# Patient Record
Sex: Female | Born: 1940
Health system: Southern US, Community
[De-identification: ages and names within clinical notes are randomized; demographics above are authoritative.]

## PROBLEM LIST (undated history)

## (undated) DIAGNOSIS — E119 Type 2 diabetes mellitus without complications: Secondary | ICD-10-CM

## (undated) DIAGNOSIS — M109 Gout, unspecified: Secondary | ICD-10-CM

## (undated) DIAGNOSIS — K219 Gastro-esophageal reflux disease without esophagitis: Secondary | ICD-10-CM

## (undated) DIAGNOSIS — F32A Depression, unspecified: Secondary | ICD-10-CM

## (undated) DIAGNOSIS — IMO0002 Reserved for concepts with insufficient information to code with codable children: Secondary | ICD-10-CM

## (undated) DIAGNOSIS — I10 Essential (primary) hypertension: Secondary | ICD-10-CM

## (undated) DIAGNOSIS — T7840XA Allergy, unspecified, initial encounter: Secondary | ICD-10-CM

## (undated) DIAGNOSIS — D649 Anemia, unspecified: Secondary | ICD-10-CM

## (undated) DIAGNOSIS — E669 Obesity, unspecified: Secondary | ICD-10-CM

## (undated) DIAGNOSIS — I639 Cerebral infarction, unspecified: Secondary | ICD-10-CM

## (undated) DIAGNOSIS — E785 Hyperlipidemia, unspecified: Secondary | ICD-10-CM

## (undated) DIAGNOSIS — I219 Acute myocardial infarction, unspecified: Secondary | ICD-10-CM

## (undated) HISTORY — DX: Type 2 diabetes mellitus without complications: E11.9

## (undated) HISTORY — DX: Gout, unspecified: M10.9

## (undated) HISTORY — DX: Cerebral infarction, unspecified: I63.9

## (undated) HISTORY — DX: Depression, unspecified: F32.A

## (undated) HISTORY — PX: TUBAL LIGATION: SHX77

## (undated) HISTORY — DX: Anemia, unspecified: D64.9

## (undated) HISTORY — DX: Hyperlipidemia, unspecified: E78.5

## (undated) HISTORY — DX: Allergy, unspecified, initial encounter: T78.40XA

## (undated) HISTORY — DX: Acute myocardial infarction, unspecified: I21.9

## (undated) HISTORY — PX: ABDOMINAL HYSTERECTOMY: SHX81

## (undated) HISTORY — DX: Gastro-esophageal reflux disease without esophagitis: K21.9

## (undated) HISTORY — DX: Obesity, unspecified: E66.9

## (undated) HISTORY — DX: Essential (primary) hypertension: I10

## (undated) HISTORY — DX: Reserved for concepts with insufficient information to code with codable children: IMO0002

---

## 2005-01-18 ENCOUNTER — Ambulatory Visit: Payer: Self-pay | Admitting: Family Medicine

## 2006-02-08 ENCOUNTER — Ambulatory Visit: Payer: Self-pay

## 2007-02-14 ENCOUNTER — Ambulatory Visit: Payer: Self-pay | Admitting: Family Medicine

## 2008-02-19 ENCOUNTER — Ambulatory Visit: Payer: Self-pay | Admitting: Family Medicine

## 2009-06-10 ENCOUNTER — Ambulatory Visit: Payer: Self-pay | Admitting: Family Medicine

## 2009-07-11 ENCOUNTER — Emergency Department: Payer: Self-pay | Admitting: Internal Medicine

## 2009-07-11 IMAGING — CR DG HAND COMPLETE 3+V*L*
1 series · 3 of 3 positions shown · non-contrast
Comparison: none

REASON FOR EXAM: pain
COMMENTS:

PROCEDURE:     DXR - DXR HAND LT COMPLETE  W/OBLIQUES  - [DATE]  [DATE]
RESULT:     Comparison:  None

[Series 1: view not recorded · 0.17mm/px · 3 of 3 slices shown]
[im 1/3]
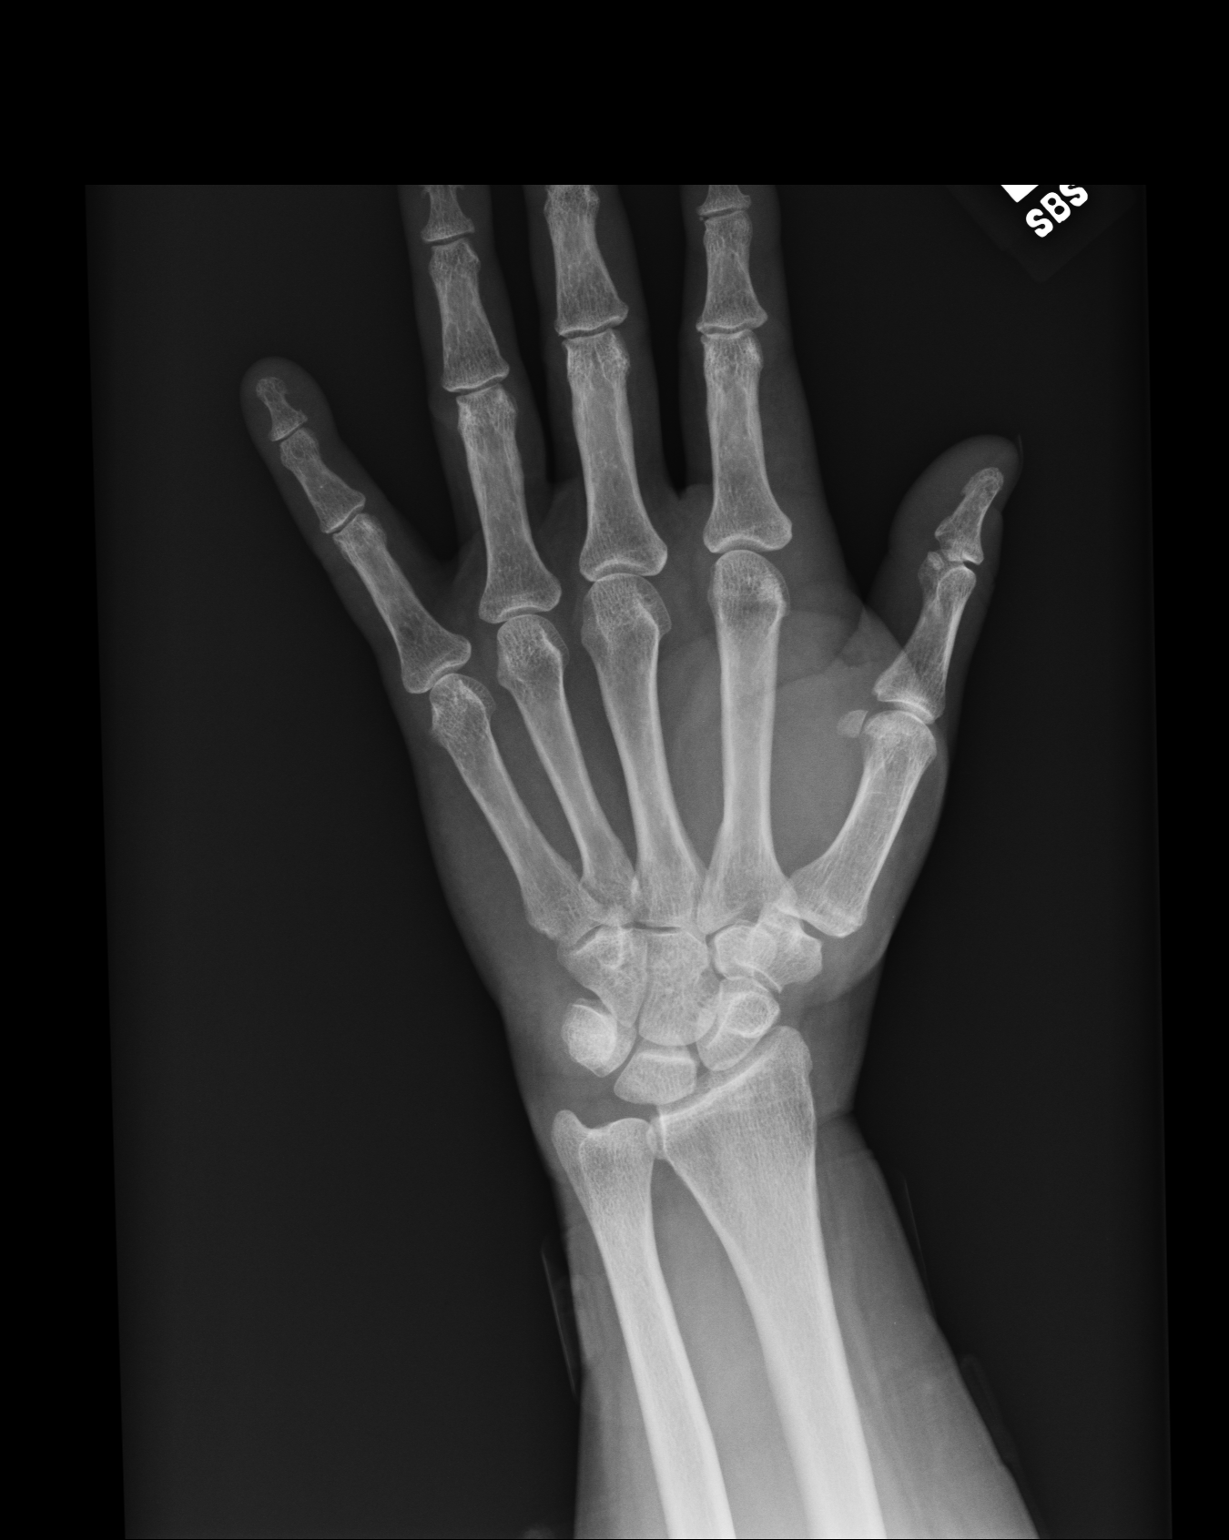
[im 2/3]
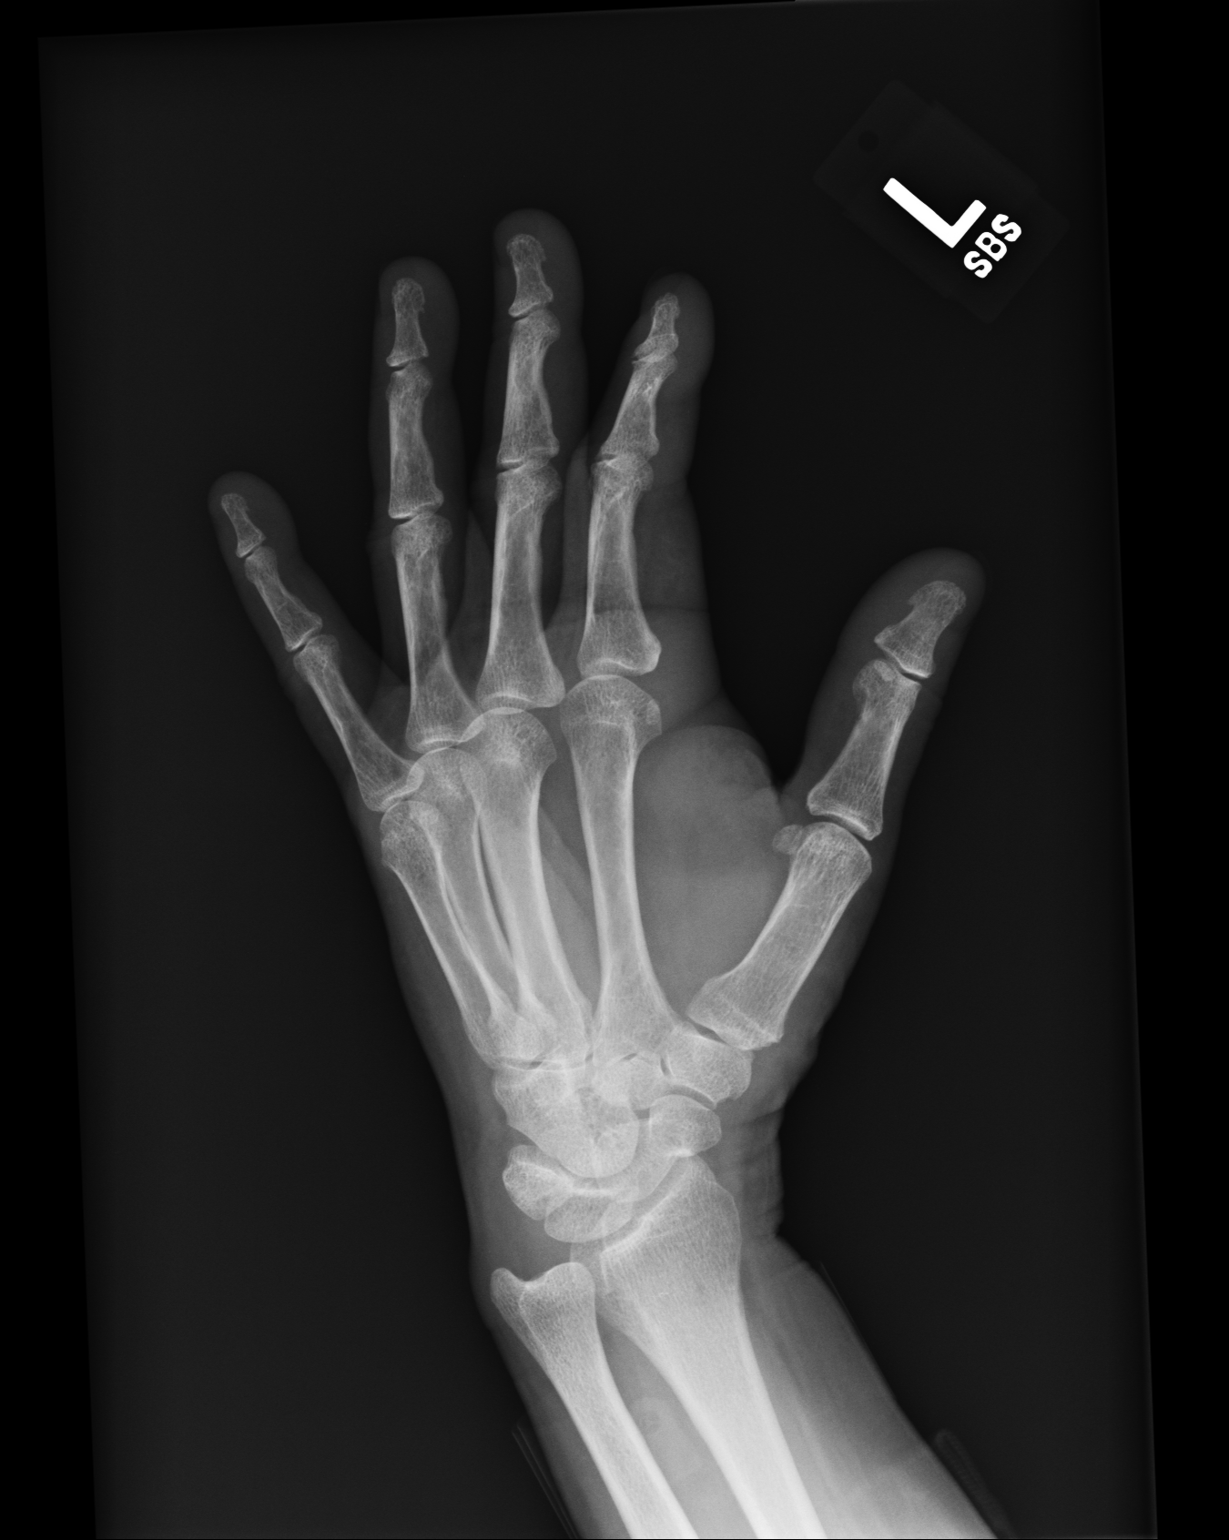
[im 3/3]
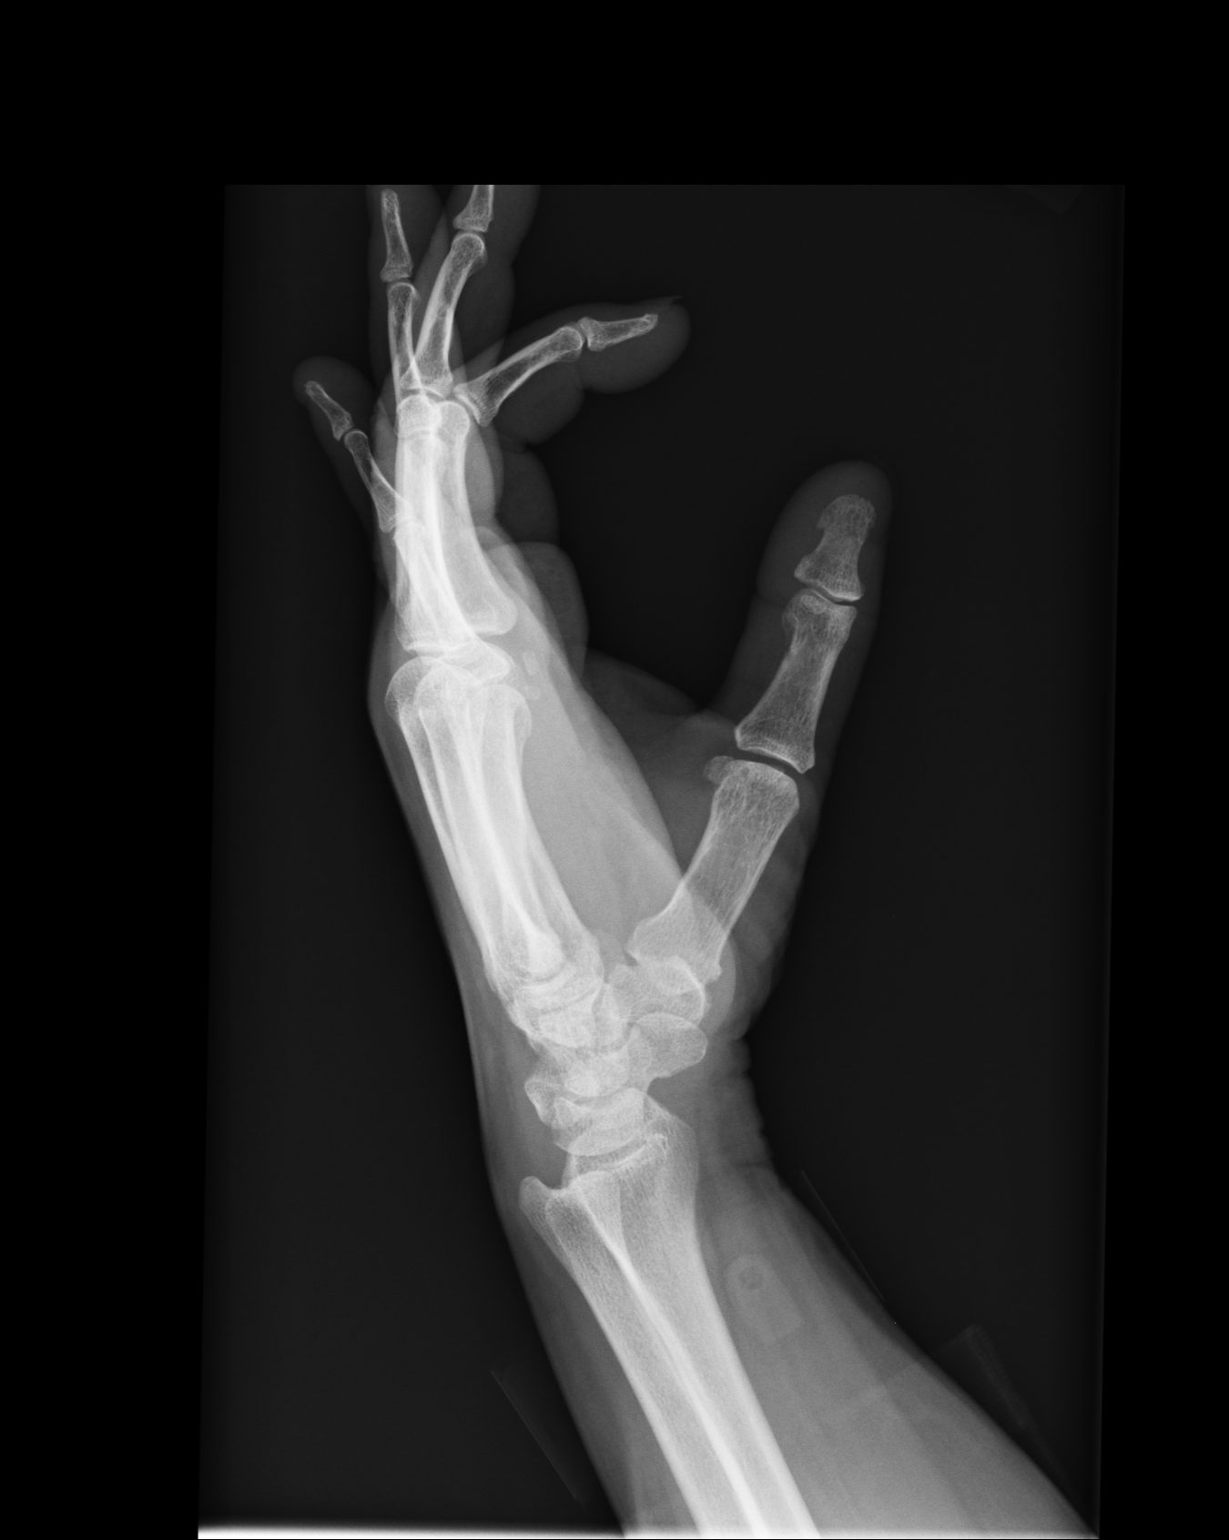

[3 of 3 positions shown; findings below may reference images not displayed]

FINDINGS: AP, oblique, and lateral views of the right hand demonstrates no fracture or
dislocation.

There is relative dorsal subluxation of the distal ulna relative 2-D radius
which may be projectional versus secondary to dorsal subluxation. Correlate
with physical exam.

There is normal bone mineralization. There are no erosive changes. The joint
spaces are maintained. There is no soft tissue swelling.
IMPRESSION: Please see above.

## 2010-11-03 ENCOUNTER — Ambulatory Visit: Payer: Self-pay | Admitting: Family Medicine

## 2011-12-14 ENCOUNTER — Ambulatory Visit: Payer: Self-pay | Admitting: Family Medicine

## 2012-08-15 IMAGING — CR DG ELBOW 2V*L*
1 series · 2 of 2 positions shown · non-contrast
Comparison: None.

CLINICAL DATA: Left elbow pain

EXAM:
LEFT ELBOW - 2 VIEW

[Series 1: ap · 0.17mm/px · 2 of 2 slices shown]
[im 1/2]
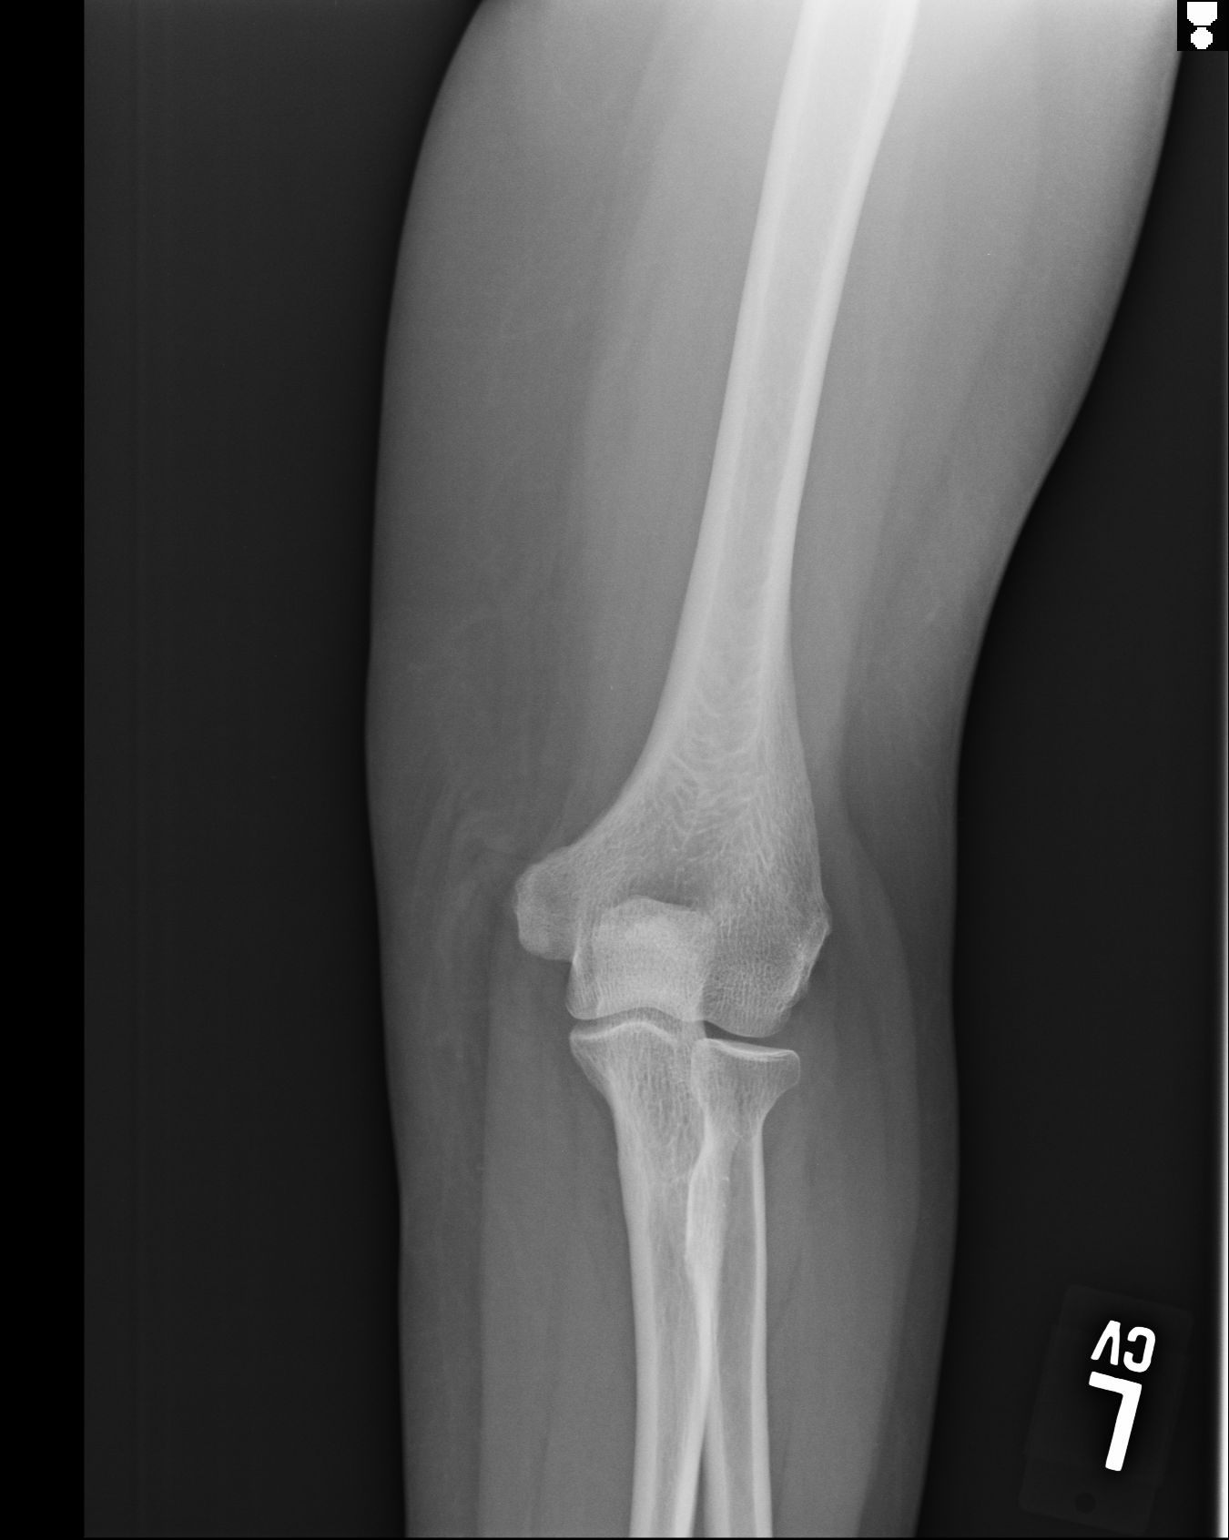
[im 2/2]
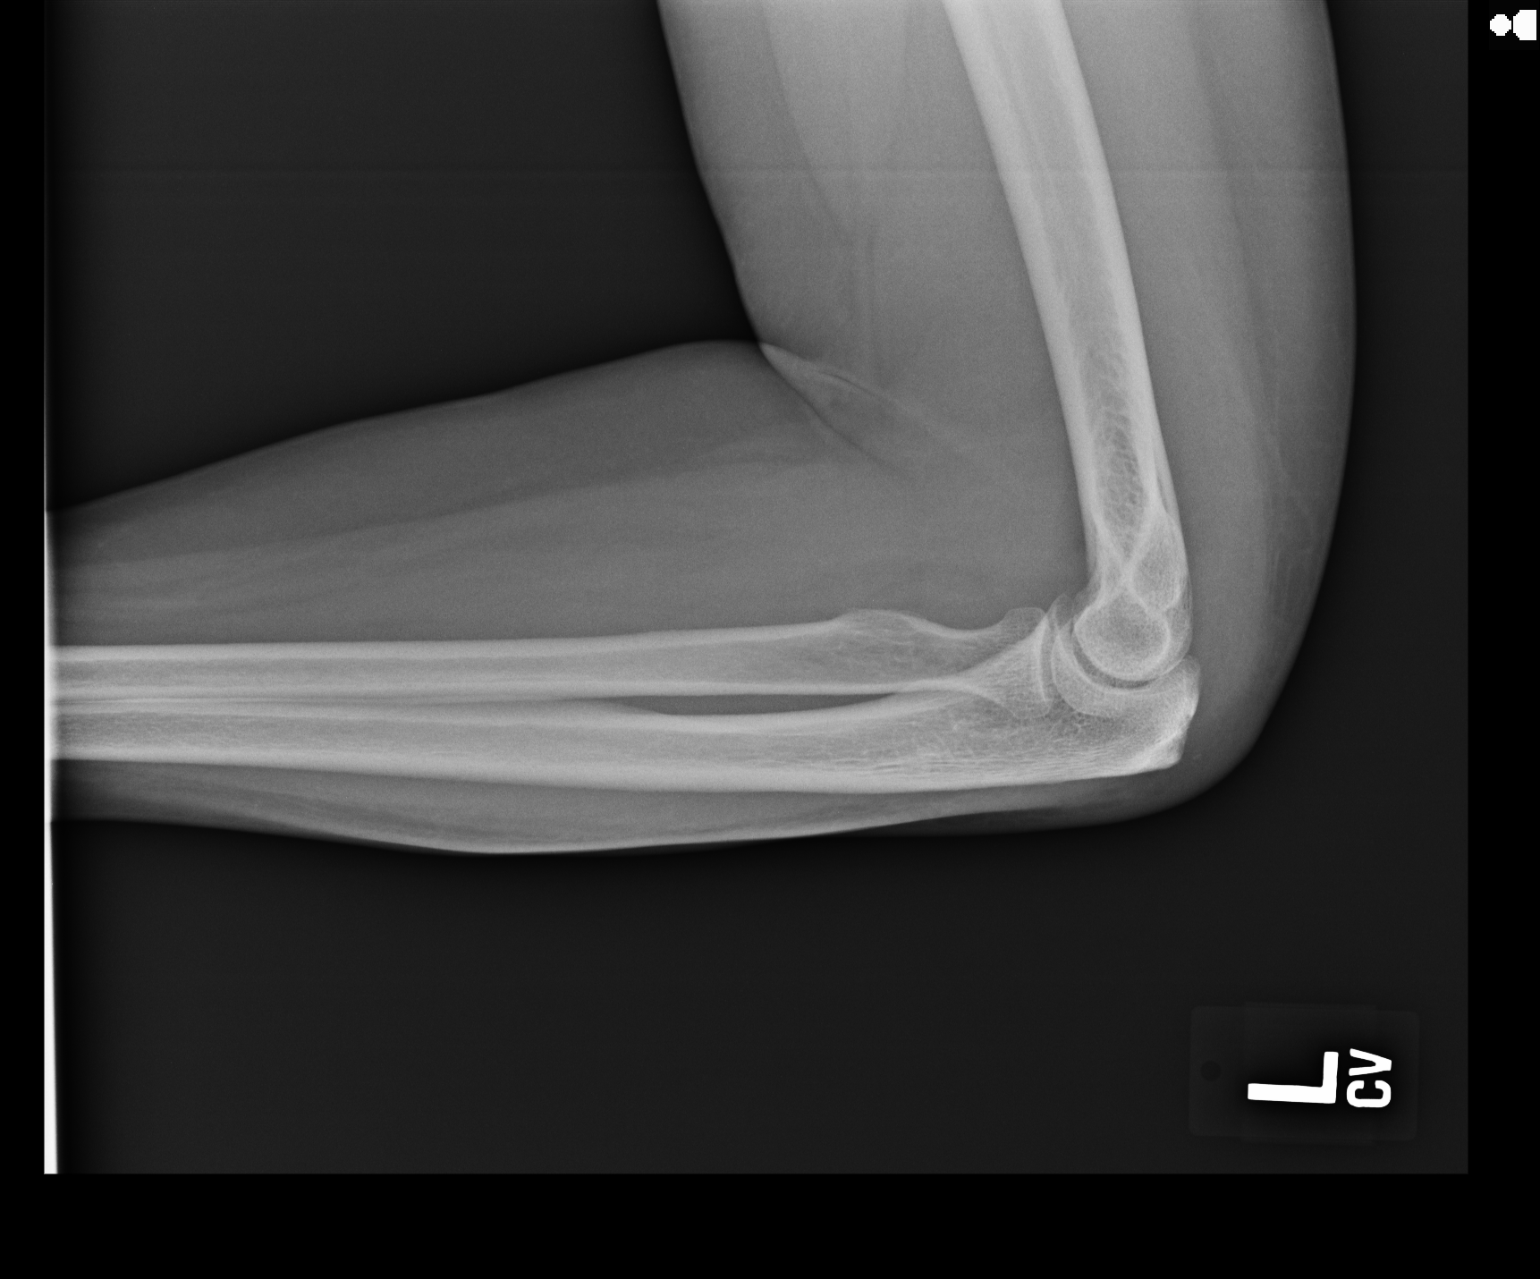

[2 of 2 positions shown; findings below may reference images not displayed]

FINDINGS: There is no evidence of fracture, dislocation, or joint effusion.
There is no evidence of arthropathy or other focal bone abnormality.
Soft tissues are unremarkable.
IMPRESSION: No acute abnormality noted.

## 2012-08-19 ENCOUNTER — Ambulatory Visit: Payer: Self-pay | Admitting: Family Medicine

## 2012-08-31 ENCOUNTER — Ambulatory Visit: Payer: Self-pay | Admitting: Family Medicine

## 2012-12-16 ENCOUNTER — Ambulatory Visit: Payer: Self-pay | Admitting: Family Medicine

## 2013-10-21 ENCOUNTER — Ambulatory Visit: Payer: Self-pay | Admitting: Family Medicine

## 2014-06-15 ENCOUNTER — Emergency Department: Payer: Self-pay | Admitting: Emergency Medicine

## 2014-06-15 LAB — CBC WITH DIFFERENTIAL/PLATELET
BASOS ABS: 0 10*3/uL (ref 0.0–0.1)
BASOS PCT: 0.7 %
Eosinophil #: 0 10*3/uL (ref 0.0–0.7)
Eosinophil %: 0.5 %
HCT: 32 % — AB (ref 35.0–47.0)
HGB: 10.3 g/dL — ABNORMAL LOW (ref 12.0–16.0)
Lymphocyte #: 1.3 10*3/uL (ref 1.0–3.6)
Lymphocyte %: 26.6 %
MCH: 28.3 pg (ref 26.0–34.0)
MCHC: 32.3 g/dL (ref 32.0–36.0)
MCV: 88 fL (ref 80–100)
MONO ABS: 0.5 x10 3/mm (ref 0.2–0.9)
Monocyte %: 10.4 %
NEUTROS ABS: 3.1 10*3/uL (ref 1.4–6.5)
Neutrophil %: 61.8 %
Platelet: 275 10*3/uL (ref 150–440)
RBC: 3.66 10*6/uL — ABNORMAL LOW (ref 3.80–5.20)
RDW: 14.7 % — ABNORMAL HIGH (ref 11.5–14.5)
WBC: 5 10*3/uL (ref 3.6–11.0)

## 2014-06-15 LAB — BASIC METABOLIC PANEL
ANION GAP: 7 (ref 7–16)
BUN: 11 mg/dL (ref 7–18)
CO2: 30 mmol/L (ref 21–32)
CREATININE: 1 mg/dL (ref 0.60–1.30)
Calcium, Total: 9.4 mg/dL (ref 8.5–10.1)
Chloride: 104 mmol/L (ref 98–107)
EGFR (African American): >60
EGFR (Non-African Amer.): 57 — ABNORMAL LOW
Glucose: 121 mg/dL — ABNORMAL HIGH (ref 65–99)
OSMOLALITY: 282 (ref 275–301)
Potassium: 3.2 mmol/L — ABNORMAL LOW (ref 3.5–5.1)
SODIUM: 141 mmol/L (ref 136–145)

## 2014-06-15 LAB — URIC ACID: Uric Acid: 4.3 mg/dL (ref 2.6–6.0)

## 2014-06-15 LAB — SEDIMENTATION RATE: ERYTHROCYTE SED RATE: 69 mm/h — AB (ref 0–30)

## 2014-06-15 IMAGING — CR RIGHT FOOT COMPLETE - 3+ VIEW
1 series · 3 of 3 positions shown · non-contrast
Comparison: None.

CLINICAL DATA: Intermittent right foot pain without known injury

EXAM:
RIGHT FOOT COMPLETE - 3+ VIEW

[Series 1: dxr foot rt complete w/obliques · 0.14mm/px · 3 of 3 slices shown]
[im 1/3]
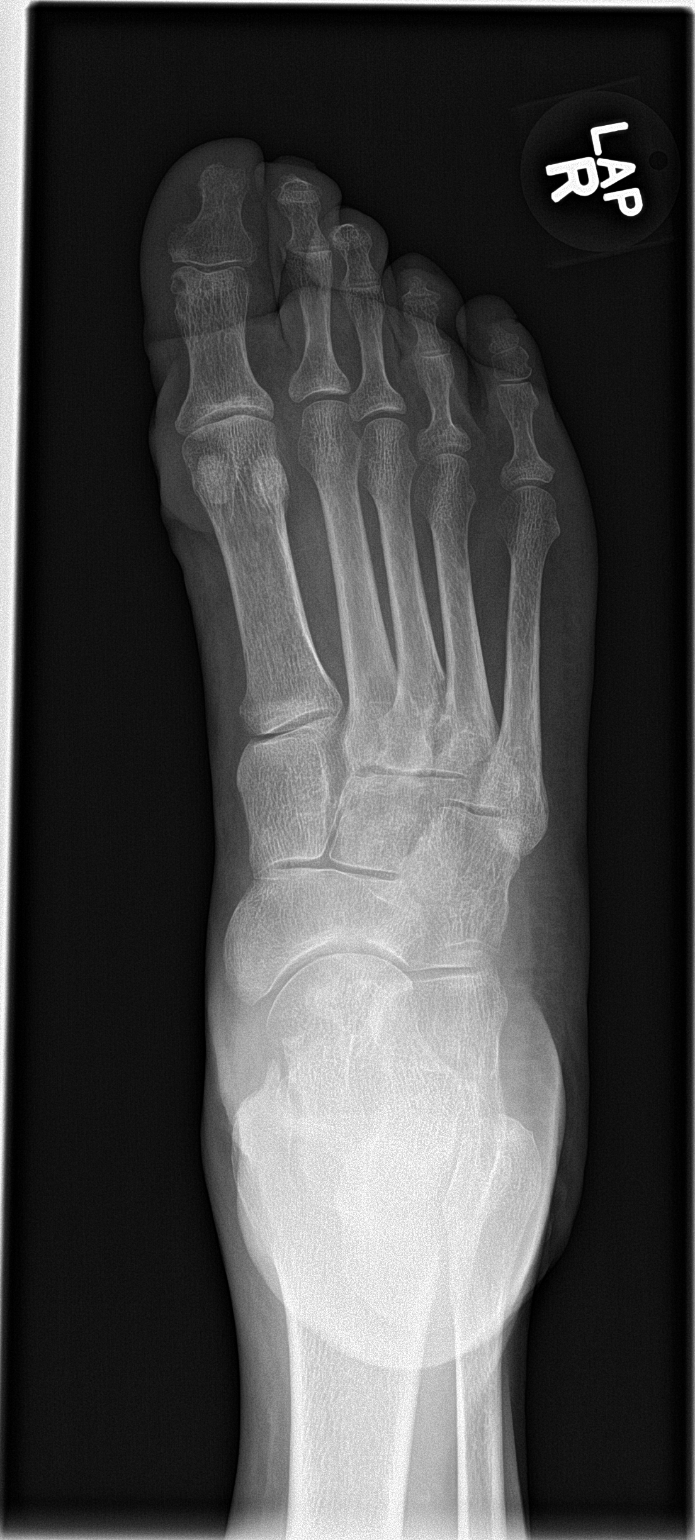
[im 2/3]
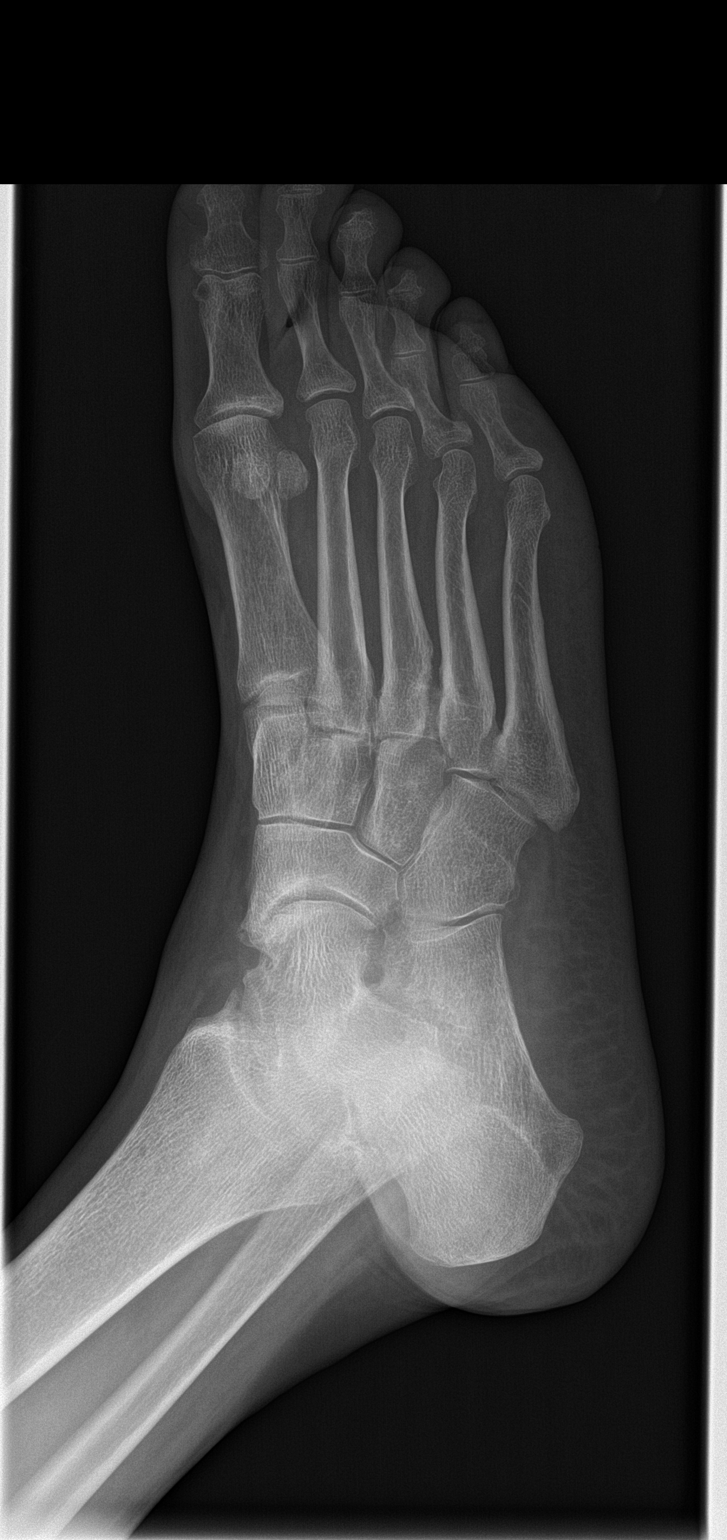
[im 3/3]
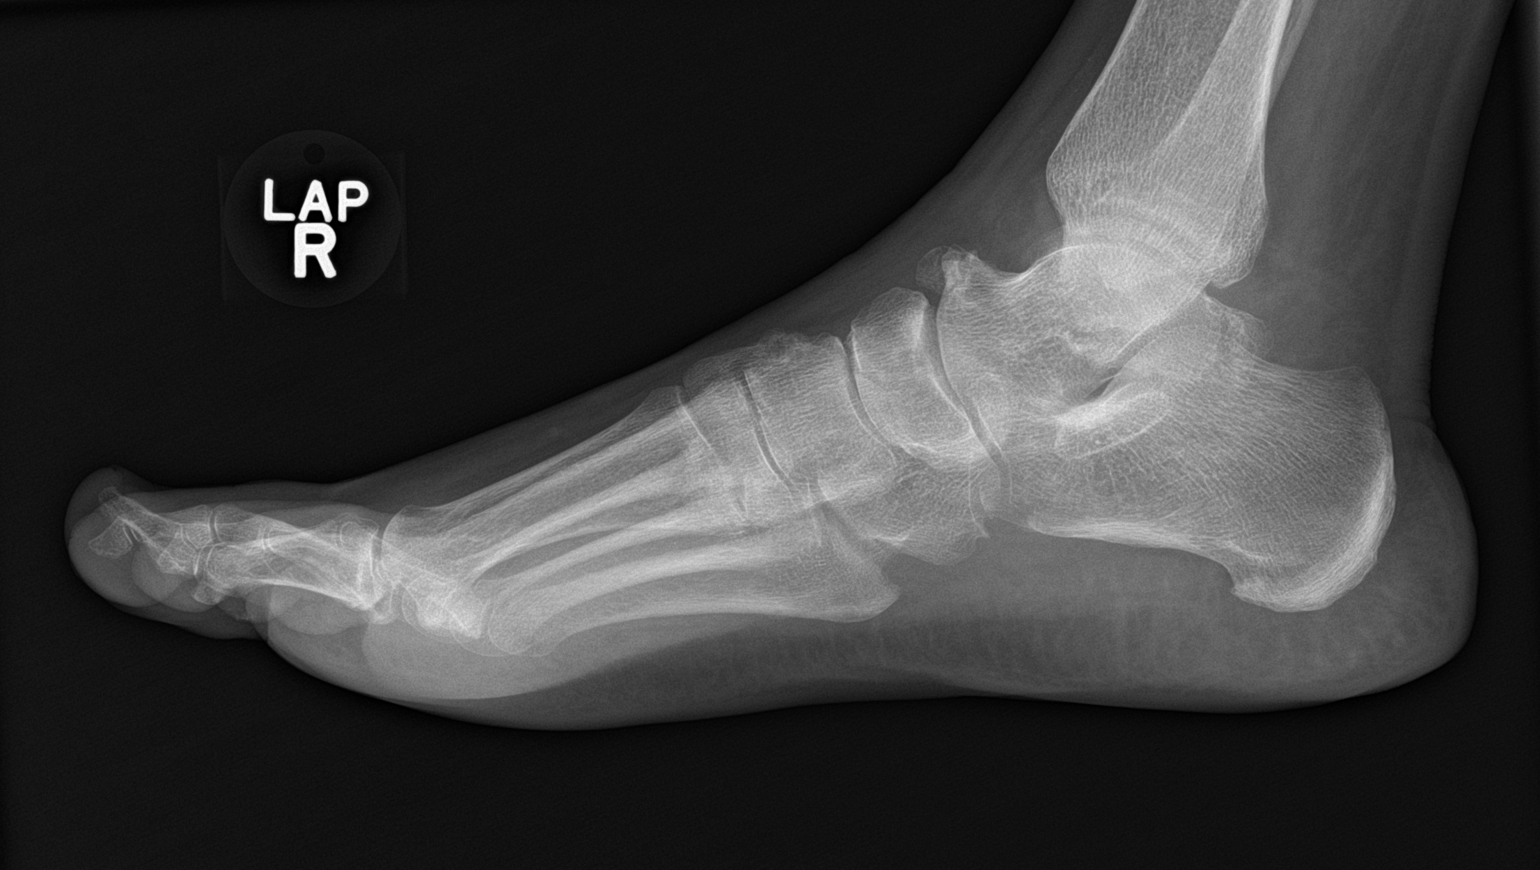

[3 of 3 positions shown; findings below may reference images not displayed]

FINDINGS: The bones of the right foot are adequately mineralized for age.
There is no lytic nor blastic bony lesion. There is no acute or
healing fracture. There are mild degenerative changes of the
interphalangeal joints and of the first metatarsophalangeal joint.
The tarsometatarsal joints are unremarkable. There are mild
degenerative changes of the intertarsal joints especially of the
talocalcaneal articulation. There is a tiny plantar calcaneal spur.
The soft tissues exhibit no gas collections or abnormal
calcifications.
IMPRESSION: There are mild degenerative changes of the right foot. No acute bony
abnormality is demonstrated.

## 2014-12-07 ENCOUNTER — Telehealth: Payer: Self-pay | Admitting: Family Medicine

## 2014-12-07 ENCOUNTER — Other Ambulatory Visit: Payer: Self-pay | Admitting: Family Medicine

## 2014-12-07 DIAGNOSIS — G8929 Other chronic pain: Secondary | ICD-10-CM

## 2014-12-07 MED ORDER — TRAMADOL HCL 50 MG PO TABS: 50.0000 mg | ORAL_TABLET | Freq: Three times a day (TID) | ORAL | Status: DC

## 2014-12-07 NOTE — Telephone Encounter (Signed)
Pt is requesting a refill on Tramadol HCL 50mg 

## 2014-12-28 ENCOUNTER — Other Ambulatory Visit: Payer: Self-pay | Admitting: Family Medicine

## 2015-01-07 ENCOUNTER — Encounter: Payer: Self-pay | Admitting: Family Medicine

## 2015-01-07 ENCOUNTER — Ambulatory Visit (INDEPENDENT_AMBULATORY_CARE_PROVIDER_SITE_OTHER): Payer: Commercial Managed Care - HMO | Admitting: Family Medicine

## 2015-01-07 VITALS — BP 138/78 | HR 80 | Temp 98.5°F | Resp 16 | Ht 68.0 in | Wt 168.1 lb

## 2015-01-07 DIAGNOSIS — I10 Essential (primary) hypertension: Secondary | ICD-10-CM | POA: Insufficient documentation

## 2015-01-07 DIAGNOSIS — E119 Type 2 diabetes mellitus without complications: Secondary | ICD-10-CM

## 2015-01-07 DIAGNOSIS — E1169 Type 2 diabetes mellitus with other specified complication: Secondary | ICD-10-CM | POA: Insufficient documentation

## 2015-01-07 DIAGNOSIS — E785 Hyperlipidemia, unspecified: Secondary | ICD-10-CM | POA: Diagnosis not present

## 2015-01-07 LAB — POCT GLYCOSYLATED HEMOGLOBIN (HGB A1C): Hemoglobin A1C: 6.5

## 2015-01-07 LAB — GLUCOSE, POCT (MANUAL RESULT ENTRY): POC Glucose: 96 mg/dl (ref 70–99)

## 2015-01-07 LAB — POCT UA - MICROALBUMIN: Microalbumin Ur, POC: 20 mg/L

## 2015-01-07 NOTE — Progress Notes (Signed)
Name: Angela Horton   MRN: [id number]    DOB: [birth year]   Date:01/07/2015       Progress Note  Subjective  Chief Complaint  Chief Complaint  Patient presents with  . Hypertension  . Diabetes  . Hyperlipidemia    Hypertension This is a chronic problem. The current episode started more than 1 year ago. The problem is unchanged. The problem is controlled. Pertinent negatives include no blurred vision, chest pain, headaches, neck pain, orthopnea, palpitations or shortness of breath. There are no associated agents to hypertension. Risk factors for coronary artery disease include diabetes mellitus, dyslipidemia, obesity, post-menopausal state and sedentary lifestyle. Past treatments include angiotensin blockers and diuretics. The current treatment provides moderate improvement. There are no compliance problems.   Diabetes She presents for her follow-up diabetic visit. She has type 2 diabetes mellitus. Her disease course has been stable. There are no hypoglycemic associated symptoms. Pertinent negatives for hypoglycemia include no dizziness, headaches, nervousness/anxiousness, seizures or tremors. Pertinent negatives for diabetes include no blurred vision, no chest pain, no weakness and no weight loss. There are no diabetic complications. Her weight is stable. She is following a diabetic diet. Her overall blood glucose range is 90-110 mg/dl.  Hyperlipidemia This is a chronic problem. The current episode started more than 1 year ago. The problem is controlled. Recent lipid tests were reviewed and are normal. Exacerbating diseases include diabetes and obesity. There are no known factors aggravating her hyperlipidemia. Pertinent negatives include no chest pain, focal weakness, myalgias or shortness of breath. Current antihyperlipidemic treatment includes statins. The current treatment provides moderate improvement of lipids. There are no compliance problems.       Past Medical History  Diagnosis Date   . Hypertension   . Hyperlipidemia   . Diabetes mellitus without complication   . Gout   . Obesity     History  Substance Use Topics  . Smoking status: Never Smoker   . Smokeless tobacco: Not on file  . Alcohol Use: No     Current outpatient prescriptions:  .  amLODipine (NORVASC) 10 MG tablet, Take 10 mg by mouth daily., Disp: , Rfl: 7 .  atorvastatin (LIPITOR) 40 MG tablet, Take 40 mg by mouth daily., Disp: , Rfl: 5 .  benazepril (LOTENSIN) 20 MG tablet, TAKE 1 TABLET BY MOUTH EVERY DAY, Disp: 30 tablet, Rfl: 7 .  brimonidine (ALPHAGAN) 0.2 % ophthalmic solution, Place 1 drop into both eyes 2 (two) times daily., Disp: , Rfl: 3 .  glimepiride (AMARYL) 2 MG tablet, Take 2 mg by mouth daily with breakfast., Disp: , Rfl:  .  hydrochlorothiazide (HYDRODIURIL) 25 MG tablet, TAKE 1 TABLET BY MOUTH DAILY FOR BP IN PLACE OF LOSARTAN, Disp: , Rfl: 2 .  traMADol (ULTRAM) 50 MG tablet, Take 1 tablet (50 mg total) by mouth 3 (three) times daily., Disp: 90 tablet, Rfl: 0  No Known Allergies  Review of Systems  Constitutional: Negative for fever, chills and weight loss.  HENT: Negative for congestion, hearing loss, sore throat and tinnitus.   Eyes: Negative for blurred vision, double vision and redness.  Respiratory: Negative for cough, hemoptysis and shortness of breath.   Cardiovascular: Negative for chest pain, palpitations, orthopnea, claudication and leg swelling.  Gastrointestinal: Negative for heartburn, nausea, vomiting, diarrhea, constipation and blood in stool.  Genitourinary: Negative for dysuria, urgency, frequency and hematuria.  Musculoskeletal: Negative for myalgias, back pain, joint pain, falls and neck pain.  Skin: Negative for itching.  Neurological: Negative for  dizziness, tingling, tremors, focal weakness, seizures, loss of consciousness, weakness and headaches.  Endo/Heme/Allergies: Does not bruise/bleed easily.  Psychiatric/Behavioral: Negative for depression and  substance abuse. The patient is not nervous/anxious and does not have insomnia.      Objective  Filed Vitals:   01/07/15 0944  BP: 138/78  Pulse: 80  Temp: 98.5 F (36.9 C)  Resp: 16  Height: 5\' 8"  (1.727 m)  Weight: 168 lb 1 oz (76.233 kg)  SpO2: 97%     Physical Exam  Constitutional: She is oriented to person, place, and time and well-developed, well-nourished, and in no distress.  Overweight  HENT:  Head: Normocephalic.  Eyes: EOM are normal. Pupils are equal, round, and reactive to light.  Neck: Normal range of motion. No thyromegaly present.  Cardiovascular: Normal rate, regular rhythm and normal heart sounds.   No murmur heard. Pulmonary/Chest: Effort normal and breath sounds normal.  Abdominal: Soft. Bowel sounds are normal.  Musculoskeletal: Normal range of motion. She exhibits no edema.  Neurological: She is alert and oriented to person, place, and time. No cranial nerve deficit. Gait normal.  Skin: Skin is warm and dry. No rash noted.  Psychiatric: Memory and affect normal.      Assessment & Plan  1. Type 2 diabetes mellitus without complication  - POCT HgB A1C - POCT Glucose (CBG) - POCT UA - Microalbumin  2. Essential hypertension Near goal - Comprehensive metabolic panel  3. Hyperlipidemia Well-controlled - Lipid panel - Comprehensive metabolic panel - TSH - Comprehensive metabolic panel - Lipid panel - TSH

## 2015-01-07 NOTE — Patient Instructions (Signed)
Recheck in 4 months

## 2015-01-08 LAB — COMPREHENSIVE METABOLIC PANEL
ALT: 10 IU/L (ref 0–32)
AST: 40 IU/L (ref 0–40)
Albumin/Globulin Ratio: 1.2 (ref 1.1–2.5)
Albumin: 4.3 g/dL (ref 3.5–4.8)
Alkaline Phosphatase: 94 IU/L (ref 39–117)
BILIRUBIN TOTAL: 0.4 mg/dL (ref 0.0–1.2)
BUN / CREAT RATIO: 9 — AB (ref 11–26)
BUN: 8 mg/dL (ref 8–27)
CHLORIDE: 99 mmol/L (ref 97–108)
CO2: 25 mmol/L (ref 18–29)
Calcium: 9.8 mg/dL (ref 8.7–10.3)
Creatinine, Ser: 0.86 mg/dL (ref 0.57–1.00)
GFR calc non Af Amer: 66 mL/min/{1.73_m2} (ref >59)
GFR, EST AFRICAN AMERICAN: 76 mL/min/{1.73_m2} (ref >59)
GLUCOSE: 105 mg/dL — AB (ref 65–99)
Globulin, Total: 3.6 g/dL (ref 1.5–4.5)
Potassium: 4.2 mmol/L (ref 3.5–5.2)
Sodium: 140 mmol/L (ref 134–144)
Total Protein: 7.9 g/dL (ref 6.0–8.5)

## 2015-01-08 LAB — LIPID PANEL
CHOLESTEROL TOTAL: 198 mg/dL (ref 100–199)
Chol/HDL Ratio: 5.1 ratio units — ABNORMAL HIGH (ref 0.0–4.4)
HDL: 39 mg/dL — AB (ref >39)
LDL Calculated: 139 mg/dL — ABNORMAL HIGH (ref 0–99)
Triglycerides: 102 mg/dL (ref 0–149)
VLDL CHOLESTEROL CAL: 20 mg/dL (ref 5–40)

## 2015-01-08 LAB — TSH: TSH: 0.994 u[IU]/mL (ref 0.450–4.500)

## 2015-01-13 ENCOUNTER — Other Ambulatory Visit: Payer: Self-pay | Admitting: Family Medicine

## 2015-02-15 ENCOUNTER — Other Ambulatory Visit: Payer: Self-pay | Admitting: Family Medicine

## 2015-03-22 ENCOUNTER — Other Ambulatory Visit: Payer: Self-pay | Admitting: Family Medicine

## 2015-04-24 ENCOUNTER — Other Ambulatory Visit: Payer: Self-pay | Admitting: Family Medicine

## 2015-05-11 ENCOUNTER — Ambulatory Visit: Payer: Commercial Managed Care - HMO | Admitting: Family Medicine

## 2015-05-20 ENCOUNTER — Ambulatory Visit (INDEPENDENT_AMBULATORY_CARE_PROVIDER_SITE_OTHER): Payer: Commercial Managed Care - HMO | Admitting: Family Medicine

## 2015-05-20 ENCOUNTER — Encounter: Payer: Self-pay | Admitting: Family Medicine

## 2015-05-20 VITALS — BP 136/80 | HR 80 | Temp 98.2°F | Resp 16 | Ht 68.0 in | Wt 170.3 lb

## 2015-05-20 DIAGNOSIS — E785 Hyperlipidemia, unspecified: Secondary | ICD-10-CM

## 2015-05-20 DIAGNOSIS — M1000 Idiopathic gout, unspecified site: Secondary | ICD-10-CM | POA: Diagnosis not present

## 2015-05-20 DIAGNOSIS — I10 Essential (primary) hypertension: Secondary | ICD-10-CM

## 2015-05-20 DIAGNOSIS — R002 Palpitations: Secondary | ICD-10-CM | POA: Diagnosis not present

## 2015-05-20 DIAGNOSIS — E1169 Type 2 diabetes mellitus with other specified complication: Secondary | ICD-10-CM

## 2015-05-20 LAB — POCT GLYCOSYLATED HEMOGLOBIN (HGB A1C): HEMOGLOBIN A1C: 6.4

## 2015-05-20 LAB — GLUCOSE, POCT (MANUAL RESULT ENTRY): POC Glucose: 111 mg/dl — AB (ref 70–99)

## 2015-05-20 NOTE — Progress Notes (Signed)
Name: Angela Horton   MRN: [id number]    DOB: Dec 03, 1940   Date:05/20/2015       Progress Note  Subjective  Chief Complaint  Chief Complaint  Patient presents with  . Hypertension    4 month follow up  . Diabetes  . Hyperlipidemia    HPI  Diabetes  Patient presents for follow-up of diabetes which is present for  years. Is currently on a regimen of  . Patient states with their diet and exercise. There's been no hypoglycemic episodes and there  polyuria polydipsia polyphagia. His average fasting glucoses been in the low around - with a high around - . There is no end organ disease.  Last diabetic eye exam was .   Last visit with dietitian was . Last microalbumin was obtained in July of this year and was normal.   Hypertension   Patient presents for follow-up of hypertension. It has been present for over over 5 years.  Patient states that there is compliance with medical regimen which consists of amlodipine 10 mg daily benazepril 20 mg daily and 25 mg hydrochlorothiazide. There is no end organ disease. Cardiac risk factors include hypertension hyperlipidemia and diabetes.  Exercise regimen consist of mental .  Diet consist of sodium restriction .  Hyperlipidemia  Patient has a history of hyperlipidemia for over 5 years.  Current medical regimen consist of atorvastatin 40 mg daily at bedtime .  Compliance is good .  Diet and exercise are currently followed fairly well .  Risk factors for cardiovascular disease include hyperlipidemia diabetes hypertension .   There have been no side effects from the medication.    Gout history of present illness  Patient remains on when necessary dosing regimen of indomethacin for flares of gout. Last episode was  Past Medical History  Diagnosis Date  . Hypertension   . Hyperlipidemia   . Diabetes mellitus without complication ([place])   . Gout   . Obesity     Social History  Substance Use Topics  . Smoking status: Never Smoker   . Smokeless  tobacco: Not on file  . Alcohol Use: No     Current outpatient prescriptions:  .  amLODipine (NORVASC) 10 MG tablet, TAKE 1 TABLET EVERY DAY, Disp: 30 tablet, Rfl: 7 .  atorvastatin (LIPITOR) 40 MG tablet, Take 40 mg by mouth daily., Disp: , Rfl: 5 .  benazepril (LOTENSIN) 20 MG tablet, TAKE 1 TABLET BY MOUTH EVERY DAY, Disp: 30 tablet, Rfl: 7 .  brimonidine (ALPHAGAN) 0.2 % ophthalmic solution, Place 1 drop into both eyes 2 (two) times daily., Disp: , Rfl: 3 .  glimepiride (AMARYL) 2 MG tablet, TAKE 1/2 TABLET DAILY, Disp: 30 tablet, Rfl: 4 .  hydrochlorothiazide (HYDRODIURIL) 25 MG tablet, TAKE 1 TABLET BY MOUTH DAILY FOR BP IN PLACE OF LOSARTAN, Disp: 30 tablet, Rfl: 2 .  traMADol (ULTRAM) 50 MG tablet, Take 1 tablet (50 mg total) by mouth 3 (three) times daily., Disp: 90 tablet, Rfl: 0  No Known Allergies  Review of Systems  Constitutional: Negative for fever, chills and weight loss.  HENT: Negative for congestion, hearing loss, sore throat and tinnitus.   Eyes: Negative for blurred vision, double vision and redness.  Respiratory: Negative for cough, hemoptysis and shortness of breath.   Cardiovascular: Negative for chest pain, palpitations, orthopnea, claudication and leg swelling.  Gastrointestinal: Negative for heartburn, nausea, vomiting, diarrhea, constipation and blood in stool.  Genitourinary: Negative for dysuria, urgency, frequency and hematuria.  Musculoskeletal:  Negative for myalgias, back pain, joint pain, falls and neck pain.  Skin: Negative for itching.  Neurological: Negative for dizziness, tingling, tremors, focal weakness, seizures, loss of consciousness, weakness and headaches.  Endo/Heme/Allergies: Does not bruise/bleed easily.  Psychiatric/Behavioral: Negative for depression and substance abuse. The patient is not nervous/anxious and does not have insomnia.      Objective  Filed Vitals:   05/20/15 1102  BP: 136/80  Pulse: 80  Temp: 98.2 F (36.8 C)   TempSrc: Oral  Resp: 16  Height: 5\' 8"  (1.727 m)  Weight: 170 lb 4.8 oz (77.248 kg)  SpO2: 97%     Physical Exam  Constitutional: She is oriented to person, place, and time and well-developed, well-nourished, and in no distress.  HENT:  Head: Normocephalic.  Eyes: EOM are normal. Pupils are equal, round, and reactive to light.  Neck: Normal range of motion. No thyromegaly present.  Cardiovascular: Normal rate, normal heart sounds and intact distal pulses.   No murmur heard. Occasional ectopic beat noted on auscultation  Pulmonary/Chest: Effort normal and breath sounds normal.  Abdominal: Soft. Bowel sounds are normal.  Musculoskeletal: Normal range of motion. She exhibits no edema.  Neurological: She is alert and oriented to person, place, and time. No cranial nerve deficit. Gait normal.  Skin: Skin is warm and dry. No rash noted.  Psychiatric: Memory and affect normal.      Assessment & Plan  1. Type 2 diabetes mellitus with other specified complication ([place]) Well-controlled. We'll discontinue glimepiride - POCT HgB A1C - POCT Glucose (CBG)  2. Essential hypertension Well-controlled  3. Hyperlipidemia Well-controlled  4. Idiopathic gout, unspecified chronicity, unspecified site Well-controlled  5. Palpitations Evaluated with EKG - EKG 12-Lead

## 2015-06-04 ENCOUNTER — Other Ambulatory Visit: Payer: Self-pay | Admitting: Family Medicine

## 2015-08-02 DIAGNOSIS — R69 Illness, unspecified: Secondary | ICD-10-CM | POA: Diagnosis not present

## 2015-08-12 ENCOUNTER — Other Ambulatory Visit: Payer: Self-pay | Admitting: Family Medicine

## 2015-09-01 DIAGNOSIS — H40003 Preglaucoma, unspecified, bilateral: Secondary | ICD-10-CM | POA: Diagnosis not present

## 2015-09-20 ENCOUNTER — Ambulatory Visit: Payer: Commercial Managed Care - HMO | Admitting: Family Medicine

## 2015-10-12 ENCOUNTER — Ambulatory Visit (INDEPENDENT_AMBULATORY_CARE_PROVIDER_SITE_OTHER): Payer: Medicare HMO | Admitting: Family Medicine

## 2015-10-12 ENCOUNTER — Encounter: Payer: Self-pay | Admitting: Family Medicine

## 2015-10-12 VITALS — BP 126/78 | HR 81 | Temp 98.3°F | Resp 16 | Wt 172.7 lb

## 2015-10-12 DIAGNOSIS — I491 Atrial premature depolarization: Secondary | ICD-10-CM | POA: Diagnosis not present

## 2015-10-12 DIAGNOSIS — R079 Chest pain, unspecified: Secondary | ICD-10-CM | POA: Diagnosis not present

## 2015-10-12 DIAGNOSIS — R141 Gas pain: Secondary | ICD-10-CM | POA: Diagnosis not present

## 2015-10-12 NOTE — Progress Notes (Signed)
Name: Angela Horton   MRN: [id number]    DOB: Jan 02, 1941   Date:10/12/2015       Progress Note  Subjective  Chief Complaint  Chief Complaint  Patient presents with  . Leg Pain    patient stated that she has had bilateral leg pain (almost like cramps) for 3 days  . Arm Pain    patient also stated that she has had bilateral arm pain for the past 3 days  . Gas    she stated she had gas pains in her chest for the past 3-days. when she takes gas-x or drinks a pepsi cola she belches a lot and feels a little better.    HPI  Chest Pain: Onset 3 days ago, came on slowly, feels like gas pain (taking Gas-X and drinking Pepsi helped her burp that gas out and provided relief). Denies any heartburn, bloating, palpitations or chest tightness.   Past Medical History  Diagnosis Date  . Hypertension   . Hyperlipidemia   . Diabetes mellitus without complication ([place])   . Gout   . Obesity     Past Surgical History  Procedure Laterality Date  . Abdominal hysterectomy      Family History  Problem Relation Age of Onset  . Kidney disease Mother   . Kidney disease Sister   . Stroke Brother   . Heart disease Sister     Social History   Social History  . Marital Status: Married    Spouse Name: N/A  . Number of Children: N/A  . Years of Education: N/A   Occupational History  . Not on file.   Social History Main Topics  . Smoking status: Never Smoker   . Smokeless tobacco: Not on file  . Alcohol Use: No  . Drug Use: No  . Sexual Activity: Not on file   Other Topics Concern  . Not on file   Social History Narrative     Current outpatient prescriptions:  .  amLODipine (NORVASC) 10 MG tablet, TAKE 1 TABLET EVERY DAY, Disp: 30 tablet, Rfl: 7 .  atorvastatin (LIPITOR) 40 MG tablet, TAKE 1 TABLET BY MOUTH ONCE DAILY, Disp: 30 tablet, Rfl: 5 .  benazepril (LOTENSIN) 20 MG tablet, TAKE 1 TABLET BY MOUTH EVERY DAY, Disp: 30 tablet, Rfl: 7 .  brimonidine (ALPHAGAN) 0.2 % ophthalmic  solution, Place 1 drop into both eyes 2 (two) times daily., Disp: , Rfl: 3 .  hydrochlorothiazide (HYDRODIURIL) 25 MG tablet, TAKE 1 TABLET BY MOUTH DAILY FOR BP IN PLACE OF LOSARTAN, Disp: 30 tablet, Rfl: 2 .  timolol (TIMOPTIC) 0.5 % ophthalmic solution, INSTILL 1 DROP INTO BOTH EYES EVERY MORNING, Disp: , Rfl: 3 .  traMADol (ULTRAM) 50 MG tablet, Take 1 tablet (50 mg total) by mouth 3 (three) times daily., Disp: 90 tablet, Rfl: 0  No Known Allergies   Review of Systems  Cardiovascular: Positive for chest pain. Negative for leg swelling.  Gastrointestinal: Negative for heartburn, nausea, vomiting and abdominal pain.      Objective  Filed Vitals:   10/12/15 1007  BP: 126/78  Pulse: 81  Temp: 98.3 F (36.8 C)  TempSrc: Oral  Resp: 16  Weight: 172 lb 11.2 oz (78.336 kg)  SpO2: 97%    Physical Exam  Constitutional: She is oriented to person, place, and time and well-developed, well-nourished, and in no distress.  Cardiovascular: Normal rate and regular rhythm.   Pulmonary/Chest: Effort normal and breath sounds normal.  Abdominal: Soft. Bowel sounds  are normal.  Neurological: She is alert and oriented to person, place, and time.  Nursing note and vitals reviewed.     Assessment & Plan  1. Gas pain Recommended continuing to take Gas-X for relief of gas pains.  2. Chest pain, unspecified chest pain type EKG reviewed, normal sinus rhythm with PACs. Denies any palpitations, abnormal or skipped beats. - EKG 12-Lead  3. Premature atrial contractions We will refer to cardiology for further assessment. - Ambulatory referral to Cardiology    [facility] 10/12/2015 10:41 AM

## 2015-10-14 DIAGNOSIS — E119 Type 2 diabetes mellitus without complications: Secondary | ICD-10-CM | POA: Diagnosis not present

## 2015-10-14 DIAGNOSIS — I208 Other forms of angina pectoris: Secondary | ICD-10-CM | POA: Diagnosis not present

## 2015-10-14 DIAGNOSIS — E785 Hyperlipidemia, unspecified: Secondary | ICD-10-CM | POA: Diagnosis not present

## 2015-10-14 DIAGNOSIS — M1A00X0 Idiopathic chronic gout, unspecified site, without tophus (tophi): Secondary | ICD-10-CM | POA: Diagnosis not present

## 2015-10-14 DIAGNOSIS — R011 Cardiac murmur, unspecified: Secondary | ICD-10-CM | POA: Diagnosis not present

## 2015-10-14 DIAGNOSIS — K219 Gastro-esophageal reflux disease without esophagitis: Secondary | ICD-10-CM | POA: Diagnosis not present

## 2015-10-14 DIAGNOSIS — I10 Essential (primary) hypertension: Secondary | ICD-10-CM | POA: Diagnosis not present

## 2015-10-18 ENCOUNTER — Ambulatory Visit: Payer: Commercial Managed Care - HMO | Admitting: Family Medicine

## 2015-10-21 ENCOUNTER — Other Ambulatory Visit: Payer: Self-pay | Admitting: Family Medicine

## 2015-11-05 DIAGNOSIS — R011 Cardiac murmur, unspecified: Secondary | ICD-10-CM | POA: Diagnosis not present

## 2015-11-05 DIAGNOSIS — I208 Other forms of angina pectoris: Secondary | ICD-10-CM | POA: Diagnosis not present

## 2015-11-11 ENCOUNTER — Telehealth: Payer: Self-pay | Admitting: Family Medicine

## 2015-11-11 NOTE — Telephone Encounter (Signed)
Patient asking you to give her a call about results where she was given a referral

## 2015-11-12 NOTE — Telephone Encounter (Signed)
Called and left message for pt to inform her that I had results faxed to our office from Dr. Clayborn Bigness and once a doctor has looked at them then someone will give her a call with results

## 2015-11-18 ENCOUNTER — Other Ambulatory Visit: Payer: Self-pay | Admitting: Family Medicine

## 2015-11-23 DIAGNOSIS — E785 Hyperlipidemia, unspecified: Secondary | ICD-10-CM | POA: Diagnosis not present

## 2015-11-23 DIAGNOSIS — K219 Gastro-esophageal reflux disease without esophagitis: Secondary | ICD-10-CM | POA: Diagnosis not present

## 2015-11-23 DIAGNOSIS — I10 Essential (primary) hypertension: Secondary | ICD-10-CM | POA: Diagnosis not present

## 2015-11-23 DIAGNOSIS — I208 Other forms of angina pectoris: Secondary | ICD-10-CM | POA: Diagnosis not present

## 2015-11-23 DIAGNOSIS — R011 Cardiac murmur, unspecified: Secondary | ICD-10-CM | POA: Diagnosis not present

## 2015-11-23 DIAGNOSIS — E119 Type 2 diabetes mellitus without complications: Secondary | ICD-10-CM | POA: Diagnosis not present

## 2015-11-23 DIAGNOSIS — M1A00X0 Idiopathic chronic gout, unspecified site, without tophus (tophi): Secondary | ICD-10-CM | POA: Diagnosis not present

## 2016-01-11 ENCOUNTER — Other Ambulatory Visit: Payer: Self-pay | Admitting: Family Medicine

## 2016-01-26 DIAGNOSIS — H40003 Preglaucoma, unspecified, bilateral: Secondary | ICD-10-CM | POA: Diagnosis not present

## 2016-02-02 DIAGNOSIS — E119 Type 2 diabetes mellitus without complications: Secondary | ICD-10-CM | POA: Diagnosis not present

## 2016-02-03 ENCOUNTER — Telehealth: Payer: Self-pay | Admitting: Family Medicine

## 2016-02-03 MED ORDER — HYDROCHLOROTHIAZIDE 25 MG PO TABS: 25.0000 mg | ORAL_TABLET | Freq: Every day | ORAL | 1 refills | Status: DC

## 2016-02-03 NOTE — Telephone Encounter (Signed)
Rx sent.  Pt notified. 

## 2016-03-02 ENCOUNTER — Ambulatory Visit: Payer: Medicare HMO | Admitting: Family Medicine

## 2016-03-23 ENCOUNTER — Ambulatory Visit (INDEPENDENT_AMBULATORY_CARE_PROVIDER_SITE_OTHER): Payer: Medicare HMO | Admitting: Family Medicine

## 2016-03-23 ENCOUNTER — Encounter: Payer: Self-pay | Admitting: Family Medicine

## 2016-03-23 DIAGNOSIS — Z5181 Encounter for therapeutic drug level monitoring: Secondary | ICD-10-CM

## 2016-03-23 DIAGNOSIS — E785 Hyperlipidemia, unspecified: Secondary | ICD-10-CM | POA: Diagnosis not present

## 2016-03-23 DIAGNOSIS — E119 Type 2 diabetes mellitus without complications: Secondary | ICD-10-CM

## 2016-03-23 DIAGNOSIS — I10 Essential (primary) hypertension: Secondary | ICD-10-CM | POA: Diagnosis not present

## 2016-03-23 LAB — COMPLETE METABOLIC PANEL WITH GFR
ALT: 12 U/L (ref 6–29)
AST: 35 U/L (ref 10–35)
Albumin: 3.9 g/dL (ref 3.6–5.1)
Alkaline Phosphatase: 58 U/L (ref 33–130)
BILIRUBIN TOTAL: 0.4 mg/dL (ref 0.2–1.2)
BUN: 8 mg/dL (ref 7–25)
CO2: 25 mmol/L (ref 20–31)
Calcium: 9.1 mg/dL (ref 8.6–10.4)
Chloride: 103 mmol/L (ref 98–110)
Creat: 0.81 mg/dL (ref 0.60–0.93)
GFR, EST AFRICAN AMERICAN: 81 mL/min (ref >=60)
GFR, EST NON AFRICAN AMERICAN: 70 mL/min (ref >=60)
GLUCOSE: 114 mg/dL — AB (ref 65–99)
Potassium: 3.8 mmol/L (ref 3.5–5.3)
Sodium: 140 mmol/L (ref 135–146)
TOTAL PROTEIN: 7.1 g/dL (ref 6.1–8.1)

## 2016-03-23 LAB — LIPID PANEL
CHOL/HDL RATIO: 5.3 ratio — AB (ref <=5.0)
Cholesterol: 189 mg/dL (ref 125–200)
HDL: 36 mg/dL — AB (ref >=46)
LDL CALC: 129 mg/dL (ref <130)
TRIGLYCERIDES: 119 mg/dL (ref <150)
VLDL: 24 mg/dL (ref <30)

## 2016-03-23 MED ORDER — HYDROCHLOROTHIAZIDE 25 MG PO TABS: 25.0000 mg | ORAL_TABLET | Freq: Every day | ORAL | 6 refills | Status: DC

## 2016-03-23 MED ORDER — BENAZEPRIL HCL 20 MG PO TABS: 20.0000 mg | ORAL_TABLET | Freq: Every day | ORAL | 6 refills | Status: DC

## 2016-03-23 NOTE — Assessment & Plan Note (Signed)
Foot exam by MD today; eye exam is UTD; check A1c and glucose (nonfasting, had cereal); limit sugary drinks, avoid white bread

## 2016-03-23 NOTE — Patient Instructions (Addendum)
Please do see your eye doctor regularly, and have your eyes examined every year (or more often per his or her recommendation) Check your feet every night and let me know right away of any sores, infections, numbness, etc. Try to limit sweets, white bread, white rice, white potatoes It is okay with me for you to not check your fingerstick blood sugars (per [employer] of Endocrinology Best Practices), unless you are interested and feel it would be helpful for you  Your goal blood pressure is less than 140 mmHg on top. Try to follow the DASH guidelines (DASH stands for Dietary Approaches to Stop Hypertension) Try to limit the sodium in your diet.  Ideally, consume less than 1.5 grams (less than 1,500mg ) per day. Do not add salt when cooking or at the table.  Check the sodium amount on labels when shopping, and choose items lower in sodium when given a choice. Avoid or limit foods that already contain a lot of sodium. Eat a diet rich in fruits and vegetables and whole grains.  Try to limit saturated fats in your diet (bologna, hot dogs, barbeque, cheeseburgers, hamburgers, steak, bacon, sausage, cheese, etc.) and get more fresh fruits, vegetables, and whole grains  Start a baby 81 mg coated aspirin daily to help protect your heart  DASH Eating Plan DASH stands for "Dietary Approaches to Stop Hypertension." The DASH eating plan is a healthy eating plan that has been shown to reduce high blood pressure (hypertension). Additional health benefits may include reducing the risk of type 2 diabetes mellitus, heart disease, and stroke. The DASH eating plan may also help with weight loss. WHAT DO I NEED TO KNOW ABOUT THE DASH EATING PLAN? For the DASH eating plan, you will follow these general guidelines:  Choose foods with a percent daily value for sodium of less than 5% (as listed on the food label).  Use salt-free seasonings or herbs instead of table salt or sea salt.  Check with your health care  provider or pharmacist before using salt substitutes.  Eat lower-sodium products, often labeled as "lower sodium" or "no salt added."  Eat fresh foods.  Eat more vegetables, fruits, and low-fat dairy products.  Choose whole grains. Look for the word "whole" as the first word in the ingredient list.  Choose fish and skinless chicken or [place] more often than red meat. Limit fish, poultry, and meat to 6 oz (170 g) each day.  Limit sweets, desserts, sugars, and sugary drinks.  Choose heart-healthy fats.  Limit cheese to 1 oz (28 g) per day.  Eat more home-cooked food and less restaurant, buffet, and fast food.  Limit fried foods.  Cook foods using methods other than frying.  Limit canned vegetables. If you do use them, rinse them well to decrease the sodium.  When eating at a restaurant, ask that your food be prepared with less salt, or no salt if possible. WHAT FOODS CAN I EAT? Seek help from a dietitian for individual calorie needs. Grains Whole grain or whole wheat bread. Brown rice. Whole grain or whole wheat pasta. Quinoa, bulgur, and whole grain cereals. Low-sodium cereals. Corn or whole wheat flour tortillas. Whole grain cornbread. Whole grain crackers. Low-sodium crackers. Vegetables Fresh or frozen vegetables (raw, steamed, roasted, or grilled). Low-sodium or reduced-sodium tomato and vegetable juices. Low-sodium or reduced-sodium tomato sauce and paste. Low-sodium or reduced-sodium canned vegetables.  Fruits All fresh, canned (in natural juice), or frozen fruits. Meat and Other Protein Products Ground beef (85% or leaner),  grass-fed beef, or beef trimmed of fat. Skinless chicken or [place]. Ground chicken or [place]. Pork trimmed of fat. All fish and seafood. Eggs. Dried beans, peas, or lentils. Unsalted nuts and seeds. Unsalted canned beans. Dairy Low-fat dairy products, such as skim or 1% milk, 2% or reduced-fat cheeses, low-fat ricotta or cottage cheese, or plain low-fat  yogurt. Low-sodium or reduced-sodium cheeses. Fats and Oils Tub margarines without trans fats. Light or reduced-fat mayonnaise and salad dressings (reduced sodium). Avocado. Safflower, olive, or canola oils. Natural peanut or almond butter. Other Unsalted popcorn and pretzels. The items listed above may not be a complete list of recommended foods or beverages. Contact your dietitian for more options. WHAT FOODS ARE NOT RECOMMENDED? Grains White bread. White pasta. White rice. Refined cornbread. Bagels and croissants. Crackers that contain trans fat. Vegetables Creamed or fried vegetables. Vegetables in a cheese sauce. Regular canned vegetables. Regular canned tomato sauce and paste. Regular tomato and vegetable juices. Fruits Dried fruits. Canned fruit in light or heavy syrup. Fruit juice. Meat and Other Protein Products Fatty cuts of meat. Ribs, chicken wings, bacon, sausage, bologna, salami, chitterlings, fatback, hot dogs, bratwurst, and packaged luncheon meats. Salted nuts and seeds. Canned beans with salt. Dairy Whole or 2% milk, cream, half-and-half, and cream cheese. Whole-fat or sweetened yogurt. Full-fat cheeses or blue cheese. Nondairy creamers and whipped toppings. Processed cheese, cheese spreads, or cheese curds. Condiments Onion and garlic salt, seasoned salt, table salt, and sea salt. Canned and packaged gravies. Worcestershire sauce. Tartar sauce. Barbecue sauce. Teriyaki sauce. Soy sauce, including reduced sodium. Steak sauce. Fish sauce. Oyster sauce. Cocktail sauce. Horseradish. Ketchup and mustard. Meat flavorings and tenderizers. Bouillon cubes. Hot sauce. Tabasco sauce. Marinades. Taco seasonings. Relishes. Fats and Oils Butter, stick margarine, lard, shortening, ghee, and bacon fat. Coconut, palm kernel, or palm oils. Regular salad dressings. Other Pickles and olives. Salted popcorn and pretzels. The items listed above may not be a complete list of foods and beverages to  avoid. Contact your dietitian for more information. WHERE CAN I FIND MORE INFORMATION? National Heart, Lung, and Blood Institute: travelstabloid.com   This information is not intended to replace advice given to you by your health care provider. Make sure you discuss any questions you have with your health care provider.   Document Released: 06/08/2011 Document Revised: 07/10/2014 Document Reviewed: 04/23/2013 Elsevier Interactive Patient Education [employer].

## 2016-03-23 NOTE — Progress Notes (Signed)
BP (!) 144/78   Pulse 82   Temp 99.2 F (37.3 C)   Wt 167 lb (75.8 kg)   SpO2 98%   BMI 25.39 kg/m    Subjective:    Patient ID: Angela Horton, female    DOB: 17-Feb-1941, 75 y.o.   MRN: [id number]  HPI: Angela Horton is a 75 y.o. female  Chief Complaint  Patient presents with  . Medication Refill   Patient is new to me; his usual provider is out of the office for an extended time  Patient has type 2 diabetes; several years No dry mouth; does have some blurred vision; no nocturia, maybe once in a while Dr. Rutherford Nail took her off of diabetes medicine, just controlling with diet Last A1c was 6.4 on May 20, 2015 No sweet tea; occasionally drinks a Coke once in a while, occasional Ginger Ale She declines flu and pneumonia shots when offered today Not taking aspirin; no active bleeding, not allergic  High cholesterol; has been on atorvastatin for 2-3 years; no aches; tries to avoid fatty meats; bacon just once in a while  Hypertension; on ACE-I and CCB and thiazide diuretic; usually gets 130 something over 70 something  Depression screen [facility] 2/9 03/23/2016 10/12/2015 05/20/2015 01/07/2015  Decreased Interest 0 0 0 0  Down, Depressed, Hopeless 1 0 0 0  PHQ - 2 Score 1 0 0 0   Relevant past medical, surgical, family and social history reviewed Past Medical History:  Diagnosis Date  . Diabetes mellitus without complication ([place])   . Gout   . Hyperlipidemia   . Hypertension   . Obesity    Past Surgical History:  Procedure Laterality Date  . ABDOMINAL HYSTERECTOMY     Family History  Problem Relation Age of Onset  . Kidney disease Mother   . Kidney disease Sister   . Stroke Brother   . Heart disease Sister    Social History  Substance Use Topics  . Smoking status: Never Smoker  . Smokeless tobacco: Not on file  . Alcohol use No   Interim medical history since last visit reviewed. Allergies and medications reviewed  Review of Systems Per HPI unless  specifically indicated above     Objective:    BP (!) 144/78   Pulse 82   Temp 99.2 F (37.3 C)   Wt 167 lb (75.8 kg)   SpO2 98%   BMI 25.39 kg/m   Wt Readings from Last 3 Encounters:  03/23/16 167 lb (75.8 kg)  10/12/15 172 lb 11.2 oz (78.3 kg)  05/20/15 170 lb 4.8 oz (77.2 kg)    Physical Exam  Constitutional: She appears well-developed and well-nourished. No distress.  HENT:  Head: Normocephalic and atraumatic.  Eyes: EOM are normal. No scleral icterus.  Neck: No thyromegaly present.  Cardiovascular: Normal rate, regular rhythm and normal heart sounds.   No murmur heard. Pulmonary/Chest: Effort normal and breath sounds normal. No respiratory distress. She has no wheezes.  Abdominal: Soft. Bowel sounds are normal. She exhibits no distension.  Musculoskeletal: Normal range of motion. She exhibits no edema.  Neurological: She is alert. She exhibits normal muscle tone.  Skin: Skin is warm and dry. She is not diaphoretic. No pallor.  Psychiatric: She has a normal mood and affect. Her behavior is normal. Judgment and thought content normal.   Diabetic Foot Form - Detailed   Diabetic Foot Exam - detailed Diabetic Foot exam was performed with the following findings:  Yes 03/23/2016 10:28  AM  Visual Foot Exam completed.:  Yes  Are the toenails long?:  No Are the toenails thick?:  No Are the toenails ingrown?:  No Normal Range of Motion:  Yes Pulse Foot Exam completed.:  Yes  Right Dorsalis Pedis:  Present Left Dorsalis Pedis:  Present  Sensory Foot Exam Completed.:  Yes Swelling:  No Semmes-Weinstein Monofilament Test R Site 1-Great Toe:  Pos L Site 1-Great Toe:  Pos  R Site 4:  Pos L Site 4:  Pos  R Site 5:  Pos L Site 5:  Pos       Results for orders placed or performed in visit on 05/20/15  POCT HgB A1C  Result Value Ref Range   Hemoglobin A1C 6.4   POCT Glucose (CBG)  Result Value Ref Range   POC Glucose 111 (A) 70 - 99 mg/dl      Assessment & Plan:    Problem List Items Addressed This Visit      Cardiovascular and Mediastinum   Essential hypertension    Well-controlled away from the doctor; continue medicines; try DASH guidelines      Relevant Medications   aspirin EC 81 MG tablet   hydrochlorothiazide (HYDRODIURIL) 25 MG tablet   benazepril (LOTENSIN) 20 MG tablet     Endocrine   Type 2 diabetes mellitus without complication (HCC)    Foot exam by MD today; eye exam is UTD; check A1c and glucose (nonfasting, had cereal); limit sugary drinks, avoid white bread      Relevant Medications   aspirin EC 81 MG tablet   benazepril (LOTENSIN) 20 MG tablet   Other Relevant Orders   Hemoglobin A1c   Microalbumin / creatinine urine ratio     Other   Medication monitoring encounter    Monitor Cr, K+, SGPT      Relevant Orders   COMPLETE METABOLIC PANEL WITH GFR   Hyperlipidemia    Check lipids today, nonfasting after cereal; check SGPT; avoid saturated fats      Relevant Medications   aspirin EC 81 MG tablet   hydrochlorothiazide (HYDRODIURIL) 25 MG tablet   benazepril (LOTENSIN) 20 MG tablet   Other Relevant Orders   Lipid panel    Other Visit Diagnoses   None.     Follow up plan: Return in about 6 months (around 09/20/2016) for diabetes and cholesterol and high blood pressure.  An after-visit summary was printed and given to the patient at [facility].  Please see the patient instructions which may contain other information and recommendations beyond what is mentioned above in the assessment and plan.  Meds ordered this encounter  Medications  . aspirin EC 81 MG tablet    Sig: Take 1 tablet (81 mg total) by mouth daily.  . hydrochlorothiazide (HYDRODIURIL) 25 MG tablet    Sig: Take 1 tablet (25 mg total) by mouth daily.    Dispense:  30 tablet    Refill:  6  . benazepril (LOTENSIN) 20 MG tablet    Sig: Take 1 tablet (20 mg total) by mouth daily.    Dispense:  30 tablet    Refill:  6    Orders Placed This  Encounter  Procedures  . Hemoglobin A1c  . Lipid panel  . Microalbumin / creatinine urine ratio  . COMPLETE METABOLIC PANEL WITH GFR   Patient declined flu shot, declined pneumonia shot

## 2016-03-23 NOTE — Assessment & Plan Note (Signed)
Monitor Cr, K+, SGPT 

## 2016-03-23 NOTE — Assessment & Plan Note (Signed)
Check lipids today, nonfasting after cereal; check SGPT; avoid saturated fats

## 2016-03-23 NOTE — Assessment & Plan Note (Signed)
Well-controlled away from the doctor; continue medicines; try DASH guidelines

## 2016-03-24 LAB — MICROALBUMIN / CREATININE URINE RATIO
Creatinine, Urine: 146 mg/dL (ref 20–320)
Microalb Creat Ratio: 20 mcg/mg creat (ref <30)
Microalb, Ur: 2.9 mg/dL

## 2016-03-24 LAB — HEMOGLOBIN A1C
Hgb A1c MFr Bld: 6.6 % — ABNORMAL HIGH (ref <5.7)
Mean Plasma Glucose: 143 mg/dL

## 2016-06-06 ENCOUNTER — Other Ambulatory Visit: Payer: Self-pay | Admitting: Family Medicine

## 2016-06-09 ENCOUNTER — Other Ambulatory Visit: Payer: Self-pay

## 2016-06-09 MED ORDER — AMLODIPINE BESYLATE 10 MG PO TABS: 10.0000 mg | ORAL_TABLET | Freq: Every day | ORAL | 8 refills | Status: DC

## 2016-06-09 NOTE — Telephone Encounter (Signed)
rx approved

## 2016-07-10 ENCOUNTER — Other Ambulatory Visit: Payer: Self-pay

## 2016-07-11 MED ORDER — ATORVASTATIN CALCIUM 40 MG PO TABS: 40.0000 mg | ORAL_TABLET | Freq: Every day | ORAL | 1 refills | Status: DC

## 2016-07-11 NOTE — Telephone Encounter (Signed)
Last sgpt reviewed; rx approved

## 2016-09-05 ENCOUNTER — Other Ambulatory Visit: Payer: Self-pay | Admitting: Family Medicine

## 2016-09-05 NOTE — Telephone Encounter (Signed)
Last sgpt reviewed; Rx approved 

## 2016-09-06 DIAGNOSIS — E119 Type 2 diabetes mellitus without complications: Secondary | ICD-10-CM | POA: Diagnosis not present

## 2016-09-20 ENCOUNTER — Ambulatory Visit: Payer: Medicare HMO | Admitting: Family Medicine

## 2016-09-25 ENCOUNTER — Other Ambulatory Visit: Payer: Self-pay

## 2016-09-25 DIAGNOSIS — I10 Essential (primary) hypertension: Secondary | ICD-10-CM

## 2016-09-25 MED ORDER — BENAZEPRIL HCL 20 MG PO TABS: 20.0000 mg | ORAL_TABLET | Freq: Every day | ORAL | 0 refills | Status: DC

## 2016-09-25 NOTE — Telephone Encounter (Signed)
March 21st appt canceled by patient; she has appt in early April Rxs approved

## 2016-09-25 NOTE — Telephone Encounter (Signed)
[facility] 90 day supply

## 2016-10-04 ENCOUNTER — Other Ambulatory Visit: Payer: Self-pay

## 2016-10-11 ENCOUNTER — Ambulatory Visit (INDEPENDENT_AMBULATORY_CARE_PROVIDER_SITE_OTHER): Payer: Medicare HMO | Admitting: Family Medicine

## 2016-10-11 ENCOUNTER — Encounter: Payer: Self-pay | Admitting: Family Medicine

## 2016-10-11 DIAGNOSIS — Z5181 Encounter for therapeutic drug level monitoring: Secondary | ICD-10-CM

## 2016-10-11 DIAGNOSIS — E782 Mixed hyperlipidemia: Secondary | ICD-10-CM

## 2016-10-11 DIAGNOSIS — I10 Essential (primary) hypertension: Secondary | ICD-10-CM

## 2016-10-11 DIAGNOSIS — E119 Type 2 diabetes mellitus without complications: Secondary | ICD-10-CM | POA: Diagnosis not present

## 2016-10-11 LAB — LIPID PANEL
CHOL/HDL RATIO: 7.2 ratio — AB (ref <5.0)
CHOLESTEROL: 215 mg/dL — AB (ref <200)
HDL: 30 mg/dL — AB (ref >50)
LDL Cholesterol: 147 mg/dL — ABNORMAL HIGH (ref <100)
Triglycerides: 192 mg/dL — ABNORMAL HIGH (ref <150)
VLDL: 38 mg/dL — AB (ref <30)

## 2016-10-11 LAB — COMPLETE METABOLIC PANEL WITH GFR
ALBUMIN: 3.9 g/dL (ref 3.6–5.1)
ALK PHOS: 75 U/L (ref 33–130)
ALT: 13 U/L (ref 6–29)
AST: 35 U/L (ref 10–35)
BILIRUBIN TOTAL: 0.4 mg/dL (ref 0.2–1.2)
BUN: 9 mg/dL (ref 7–25)
CO2: 30 mmol/L (ref 20–31)
Calcium: 9.5 mg/dL (ref 8.6–10.4)
Chloride: 103 mmol/L (ref 98–110)
Creat: 0.89 mg/dL (ref 0.60–0.93)
GFR, EST AFRICAN AMERICAN: 72 mL/min (ref >=60)
GFR, EST NON AFRICAN AMERICAN: 63 mL/min (ref >=60)
GLUCOSE: 119 mg/dL — AB (ref 65–99)
Potassium: 4.4 mmol/L (ref 3.5–5.3)
Sodium: 140 mmol/L (ref 135–146)
TOTAL PROTEIN: 7.5 g/dL (ref 6.1–8.1)

## 2016-10-11 MED ORDER — CHLORTHALIDONE 25 MG PO TABS: 25.0000 mg | ORAL_TABLET | Freq: Every day | ORAL | 6 refills | Status: DC

## 2016-10-11 NOTE — Assessment & Plan Note (Signed)
Improvement on last labs; check today; continue statin; limit fatty meats and eggs

## 2016-10-11 NOTE — Assessment & Plan Note (Signed)
Check A1c; foot exam by MD today; eye exam UTD; will see new eye doctor next time; limit sweets; urine microalbumin:Cr normal and UTD

## 2016-10-11 NOTE — Patient Instructions (Addendum)
STOP HCTZ 25 mg pill Start new chlorthalidone 25 mg pill once a day instead for blood pressure Return in 2 weeks to recheck your blood pressure with the medical assistant here We want to try to get your top blood pressure number under 140 at least Your goal blood pressure is less than 140 mmHg on top. Try to follow the DASH guidelines (DASH stands for Dietary Approaches to Stop Hypertension) Try to limit the sodium in your diet.  Ideally, consume less than 1.5 grams (less than 1,500mg ) per day. Do not add salt when cooking or at the table.  Check the sodium amount on labels when shopping, and choose items lower in sodium when given a choice. Avoid or limit foods that already contain a lot of sodium. Eat a diet rich in fruits and vegetables and whole grains.   DASH Eating Plan DASH stands for "Dietary Approaches to Stop Hypertension." The DASH eating plan is a healthy eating plan that has been shown to reduce high blood pressure (hypertension). It may also reduce your risk for type 2 diabetes, heart disease, and stroke. The DASH eating plan may also help with weight loss. What are tips for following this plan? General guidelines   Avoid eating more than 2,300 mg (milligrams) of salt (sodium) a day. If you have hypertension, you may need to reduce your sodium intake to 1,500 mg a day.  Limit alcohol intake to no more than 1 drink a day for nonpregnant women and 2 drinks a day for men. One drink equals 12 oz of beer, 5 oz of wine, or 1 oz of hard liquor.  Work with your health care provider to maintain a healthy body weight or to lose weight. Ask what an ideal weight is for you.  Get at least 30 minutes of exercise that causes your heart to beat faster (aerobic exercise) most days of the week. Activities may include walking, swimming, or biking.  Work with your health care provider or diet and nutrition specialist (dietitian) to adjust your eating plan to your individual calorie needs. Reading  food labels   Check food labels for the amount of sodium per serving. Choose foods with less than 5 percent of the Daily Value of sodium. Generally, foods with less than 300 mg of sodium per serving fit into this eating plan.  To find whole grains, look for the word "whole" as the first word in the ingredient list. Shopping   Buy products labeled as "low-sodium" or "no salt added."  Buy fresh foods. Avoid canned foods and premade or frozen meals. Cooking   Avoid adding salt when cooking. Use salt-free seasonings or herbs instead of table salt or sea salt. Check with your health care provider or pharmacist before using salt substitutes.  Do not fry foods. Cook foods using healthy methods such as baking, boiling, grilling, and broiling instead.  Cook with heart-healthy oils, such as olive, canola, soybean, or sunflower oil. Meal planning    Eat a balanced diet that includes:  5 or more servings of fruits and vegetables each day. At each meal, try to fill half of your plate with fruits and vegetables.  Up to 6-8 servings of whole grains each day.  Less than 6 oz of lean meat, poultry, or fish each day. A 3-oz serving of meat is about the same size as a deck of cards. One egg equals 1 oz.  2 servings of low-fat dairy each day.  A serving of nuts, seeds, or beans  5 times each week.  Heart-healthy fats. Healthy fats called Omega-3 fatty acids are found in foods such as flaxseeds and coldwater fish, like sardines, salmon, and mackerel.  Limit how much you eat of the following:  Canned or prepackaged foods.  Food that is high in trans fat, such as fried foods.  Food that is high in saturated fat, such as fatty meat.  Sweets, desserts, sugary drinks, and other foods with added sugar.  Full-fat dairy products.  Do not salt foods before eating.  Try to eat at least 2 vegetarian meals each week.  Eat more home-cooked food and less restaurant, buffet, and fast food.  When  eating at a restaurant, ask that your food be prepared with less salt or no salt, if possible. What foods are recommended? The items listed may not be a complete list. Talk with your dietitian about what dietary choices are best for you. Grains  Whole-grain or whole-wheat bread. Whole-grain or whole-wheat pasta. Brown rice. [place]. Bulgur. Whole-grain and low-sodium cereals. Pita bread. Low-fat, low-sodium crackers. Whole-wheat flour tortillas. Vegetables  Fresh or frozen vegetables (raw, steamed, roasted, or grilled). Low-sodium or reduced-sodium tomato and vegetable juice. Low-sodium or reduced-sodium tomato sauce and tomato paste. Low-sodium or reduced-sodium canned vegetables. Fruits  All fresh, dried, or frozen fruit. Canned fruit in natural juice (without added sugar). Meat and other protein foods  Skinless chicken or [place]. Ground chicken or [place]. Pork with fat trimmed off. Fish and seafood. Egg whites. Dried beans, peas, or lentils. Unsalted nuts, nut butters, and seeds. Unsalted canned beans. Lean cuts of beef with fat trimmed off. Low-sodium, lean deli meat. Dairy  Low-fat (1%) or fat-free (skim) milk. Fat-free, low-fat, or reduced-fat cheeses. Nonfat, low-sodium ricotta or cottage cheese. Low-fat or nonfat yogurt. Low-fat, low-sodium cheese. Fats and oils  Soft margarine without trans fats. Vegetable oil. Low-fat, reduced-fat, or light mayonnaise and salad dressings (reduced-sodium). Canola, safflower, olive, soybean, and sunflower oils. Avocado. Seasoning and other foods  Herbs. Spices. Seasoning mixes without salt. Unsalted popcorn and pretzels. Fat-free sweets. What foods are not recommended? The items listed may not be a complete list. Talk with your dietitian about what dietary choices are best for you. Grains  Baked goods made with fat, such as croissants, muffins, or some breads. Dry pasta or rice meal packs. Vegetables  Creamed or fried vegetables. Vegetables in a  cheese sauce. Regular canned vegetables (not low-sodium or reduced-sodium). Regular canned tomato sauce and paste (not low-sodium or reduced-sodium). Regular tomato and vegetable juice (not low-sodium or reduced-sodium). Angie Fava. Olives. Fruits  Canned fruit in a light or heavy syrup. Fried fruit. Fruit in cream or butter sauce. Meat and other protein foods  Fatty cuts of meat. Ribs. Fried meat. Berniece Salines. Sausage. Bologna and other processed lunch meats. Salami. Fatback. Hotdogs. Bratwurst. Salted nuts and seeds. Canned beans with added salt. Canned or smoked fish. Whole eggs or egg yolks. Chicken or [place] with skin. Dairy  Whole or 2% milk, cream, and half-and-half. Whole or full-fat cream cheese. Whole-fat or sweetened yogurt. Full-fat cheese. Nondairy creamers. Whipped toppings. Processed cheese and cheese spreads. Fats and oils  Butter. Stick margarine. Lard. Shortening. Ghee. Bacon fat. Tropical oils, such as coconut, palm kernel, or palm oil. Seasoning and other foods  Salted popcorn and pretzels. Onion salt, garlic salt, seasoned salt, table salt, and sea salt. Worcestershire sauce. Tartar sauce. Barbecue sauce. Teriyaki sauce. Soy sauce, including reduced-sodium. Steak sauce. Canned and packaged gravies. Fish sauce. Oyster sauce. Cocktail sauce. Horseradish  that you find on the shelf. Ketchup. Mustard. Meat flavorings and tenderizers. Bouillon cubes. Hot sauce and Tabasco sauce. Premade or packaged marinades. Premade or packaged taco seasonings. Relishes. Regular salad dressings. Where to find more information:  National Heart, Lung, and [place]: https://wilson-eaton.com/  American Heart Association: www.heart.org Summary  The DASH eating plan is a healthy eating plan that has been shown to reduce high blood pressure (hypertension). It may also reduce your risk for type 2 diabetes, heart disease, and stroke.  With the DASH eating plan, you should limit salt (sodium) intake to 2,300 mg a  day. If you have hypertension, you may need to reduce your sodium intake to 1,500 mg a day.  When on the DASH eating plan, aim to eat more fresh fruits and vegetables, whole grains, lean proteins, low-fat dairy, and heart-healthy fats.  Work with your health care provider or diet and nutrition specialist (dietitian) to adjust your eating plan to your individual calorie needs. This information is not intended to replace advice given to you by your health care provider. Make sure you discuss any questions you have with your health care provider. Document Released: 06/08/2011 Document Revised: 06/12/2016 Document Reviewed: 06/12/2016 Elsevier Interactive Patient Education  2017 [employer].

## 2016-10-11 NOTE — Assessment & Plan Note (Signed)
Check labs 

## 2016-10-11 NOTE — Assessment & Plan Note (Addendum)
Recheck BP showed systolic still above 349; will switch HCTZ to chlorthalidone; recheck in 2 weeks with CMA Try DASH guidelines, especially limit salt

## 2016-10-11 NOTE — Progress Notes (Signed)
BP (!) 142/86   Pulse 87   Temp 98.6 F (37 C) (Oral)   Resp 14   Wt 163 lb 9.6 oz (74.2 kg)   SpO2 98%   BMI 24.88 kg/m    Subjective:    Patient ID: Angela Horton, female    DOB: October 05, 1940, 76 y.o.   MRN: [id number]  HPI: Angela Horton is a 76 y.o. female  Chief Complaint  Patient presents with  . Follow-up   Here for f/u No illness, doing well Type 2 diabetes; not checking sugars with my blessing; trying to stay away from sweets; last A1c 6.6 High cholesterol; taking 40 mg atorvastatin; eats oatmeal cookies as a treat; not eating eggs; no cheeseburgers; occasional bacon, maybe a smoked sausage once every 2 weeks High blood pressure; not adding much salt at all; no side effects from medicine; no dry mouth, no leg edema Glaucoma, eye drops; sees eye doctor at [facility]; her eye doctor is leaving  Depression screen [facility] 2/9 10/11/2016 03/23/2016 10/12/2015 05/20/2015 01/07/2015  Decreased Interest 0 0 0 0 0  Down, Depressed, Hopeless 0 1 0 0 0  PHQ - 2 Score 0 1 0 0 0   Relevant past medical, surgical, family and social history reviewed Past Medical History:  Diagnosis Date  . Diabetes mellitus without complication ([place])   . Gout   . Hyperlipidemia   . Hypertension   . Obesity    Past Surgical History:  Procedure Laterality Date  . ABDOMINAL HYSTERECTOMY     Family History  Problem Relation Age of Onset  . Kidney disease Mother   . Kidney disease Sister   . Stroke Brother   . Heart disease Sister   . Stroke Sister    Social History  Substance Use Topics  . Smoking status: Never Smoker  . Smokeless tobacco: Never Used  . Alcohol use No    Interim medical history since last visit reviewed. Allergies and medications reviewed  Review of Systems  Respiratory: Negative for shortness of breath.   Cardiovascular: Negative for chest pain.  Gastrointestinal: Negative for abdominal pain.   Per HPI unless specifically indicated above       Objective:    BP (!) 142/86   Pulse 87   Temp 98.6 F (37 C) (Oral)   Resp 14   Wt 163 lb 9.6 oz (74.2 kg)   SpO2 98%   BMI 24.88 kg/m   Wt Readings from Last 3 Encounters:  10/11/16 163 lb 9.6 oz (74.2 kg)  03/23/16 167 lb (75.8 kg)  10/12/15 172 lb 11.2 oz (78.3 kg)   Today's Vitals   10/11/16 0959 10/11/16 1021  BP: (!) 146/88 (!) 142/86  Pulse: 87   Resp: 14   Temp: 98.6 F (37 C)   TempSrc: Oral   SpO2: 98%   Weight: 163 lb 9.6 oz (74.2 kg)     Physical Exam  Constitutional: She appears well-developed and well-nourished. No distress.  HENT:  Head: Normocephalic and atraumatic.  Eyes: EOM are normal. No scleral icterus.  Neck: No thyromegaly present.  Cardiovascular: Normal rate, regular rhythm and normal heart sounds.   No murmur heard. Pulmonary/Chest: Effort normal and breath sounds normal. No respiratory distress. She has no wheezes.  Abdominal: Soft. Bowel sounds are normal. She exhibits no distension.  Musculoskeletal: Normal range of motion. She exhibits no edema.  Neurological: She is alert. She exhibits normal muscle tone.  Skin: Skin is warm and dry. She  is not diaphoretic. No pallor.  Psychiatric: She has a normal mood and affect. Her behavior is normal. Judgment and thought content normal.   Diabetic Foot Form - Detailed   Diabetic Foot Exam - detailed Diabetic Foot exam was performed with the following findings:  Yes 10/11/2016 11:41 AM  Visual Foot Exam completed.:  Yes  Are the toenails ingrown?:  No Normal Range of Motion:  Yes Right Dorsalis Pedis:  Present Left Dorsalis Pedis:  Present  Swelling:  No Semmes-Weinstein Monofilament Test R Site 1-Great Toe:  Pos L Site 1-Great Toe:  Pos  R Site 4:  Pos L Site 4:  Pos  R Site 5:  Pos L Site 5:  Pos          Assessment & Plan:   Problem List Items Addressed This Visit      Cardiovascular and Mediastinum   Essential hypertension    Recheck BP showed systolic still above 875; will  switch HCTZ to chlorthalidone; recheck in 2 weeks with CMA Try DASH guidelines, especially limit salt      Relevant Medications   chlorthalidone (HYGROTON) 25 MG tablet     Endocrine   Type 2 diabetes mellitus without complication (HCC)    Check A1c; foot exam by MD today; eye exam UTD; will see new eye doctor next time; limit sweets; urine microalbumin:Cr normal and UTD      Relevant Orders   Hemoglobin A1c (Completed)     Other   Medication monitoring encounter    Check labs      Relevant Orders   COMPLETE METABOLIC PANEL WITH GFR (Completed)   Hyperlipidemia    Improvement on last labs; check today; continue statin; limit fatty meats and eggs      Relevant Medications   chlorthalidone (HYGROTON) 25 MG tablet   Other Relevant Orders   Lipid panel (Completed)       Follow up plan: Return in about 2 weeks (around 10/25/2016) for blood pressure recheck with CMA; 6 months with Dr. Sanda Klein.  An after-visit summary was printed and given to the patient at [place].  Please see the patient instructions which may contain other information and recommendations beyond what is mentioned above in the assessment and plan.  Meds ordered this encounter  Medications  . Omega-3 Fatty Acids (FISH OIL) 1000 MG CAPS    Sig: Take 1 capsule by mouth daily.  . Multiple Vitamin (MULTIVITAMIN) tablet    Sig: Take 1 tablet by mouth daily.  . chlorthalidone (HYGROTON) 25 MG tablet    Sig: Take 1 tablet (25 mg total) by mouth daily. For blood pressure; this replaces HCTZ    Dispense:  30 tablet    Refill:  6    STOP HCTZ    Orders Placed This Encounter  Procedures  . Lipid panel  . Hemoglobin A1c  . COMPLETE METABOLIC PANEL WITH GFR

## 2016-10-12 LAB — HEMOGLOBIN A1C
Hgb A1c MFr Bld: 6.5 % — ABNORMAL HIGH (ref <5.7)
Mean Plasma Glucose: 140 mg/dL

## 2016-10-24 ENCOUNTER — Other Ambulatory Visit: Payer: Self-pay | Admitting: Family Medicine

## 2016-10-24 DIAGNOSIS — I10 Essential (primary) hypertension: Secondary | ICD-10-CM

## 2016-10-24 NOTE — Telephone Encounter (Signed)
Approved; pt coming by in two days for BP check with CMA

## 2016-10-26 ENCOUNTER — Ambulatory Visit (INDEPENDENT_AMBULATORY_CARE_PROVIDER_SITE_OTHER): Payer: Medicare HMO

## 2016-10-26 VITALS — BP 136/78 | HR 71

## 2016-10-26 DIAGNOSIS — I10 Essential (primary) hypertension: Secondary | ICD-10-CM

## 2016-10-26 NOTE — Patient Instructions (Signed)
BP and pulse readings are good. No changes to your BP Medication and she will see you at your next visit.

## 2016-11-04 ENCOUNTER — Other Ambulatory Visit: Payer: Self-pay | Admitting: Family Medicine

## 2016-11-04 DIAGNOSIS — E782 Mixed hyperlipidemia: Secondary | ICD-10-CM

## 2016-11-04 DIAGNOSIS — Z5181 Encounter for therapeutic drug level monitoring: Secondary | ICD-10-CM

## 2016-11-04 MED ORDER — ATORVASTATIN CALCIUM 80 MG PO TABS
80.0000 mg | ORAL_TABLET | Freq: Every day | ORAL | 1 refills | Status: DC
Start: 2016-11-04 — End: 2016-12-29

## 2016-11-04 NOTE — Telephone Encounter (Signed)
Increase statin Recheck labs around June 18th; orders entered

## 2016-11-14 ENCOUNTER — Other Ambulatory Visit: Payer: Self-pay | Admitting: Family Medicine

## 2016-11-14 DIAGNOSIS — I10 Essential (primary) hypertension: Secondary | ICD-10-CM

## 2016-11-14 NOTE — Telephone Encounter (Signed)
Please resolve benazepril Rx with pharmacy

## 2016-12-29 ENCOUNTER — Other Ambulatory Visit: Payer: Self-pay | Admitting: Family Medicine

## 2016-12-29 ENCOUNTER — Telehealth: Payer: Self-pay

## 2016-12-29 DIAGNOSIS — E782 Mixed hyperlipidemia: Secondary | ICD-10-CM

## 2016-12-29 DIAGNOSIS — Z5181 Encounter for therapeutic drug level monitoring: Secondary | ICD-10-CM

## 2016-12-29 NOTE — Telephone Encounter (Signed)
Pt wondering why she has to come in and get blood work done. I went in her chart and saw that Dr. lada increase her statin and would like for her to come in around June 18th. I stated to pt that she can come in early next week. She has 14 days to come in. Pt stated she will come in Tuesday morning. Mention to pt to come in fasting.

## 2016-12-29 NOTE — Telephone Encounter (Signed)
Dr. Sanda Klein wanted patient to return in May for FASTING lipid panel and ALT lab draws. Please advise patient she needs to have this done as soon as possible. I will provide 2 week supply so that she can return to have this done, and requisitions are at the front desk. Thank you!

## 2016-12-29 NOTE — Telephone Encounter (Signed)
Left a detail messaged  

## 2017-01-02 ENCOUNTER — Telehealth: Payer: Self-pay

## 2017-01-02 DIAGNOSIS — E782 Mixed hyperlipidemia: Secondary | ICD-10-CM | POA: Diagnosis not present

## 2017-01-02 DIAGNOSIS — Z5181 Encounter for therapeutic drug level monitoring: Secondary | ICD-10-CM | POA: Diagnosis not present

## 2017-01-02 NOTE — Telephone Encounter (Signed)
I removed Lipitor 80 mg from med list Lab results will be back tomorrow so we'll see what those show and send in new Rx I called patient; told her to stop the atorvastatin, don't take any more; give herself a rest for a few days and we'll contact her when the labs are back with new recommendation

## 2017-01-02 NOTE — Telephone Encounter (Signed)
Pt requesting return call to discuss possibly changing the dosage on atorvastatin due to it upsetting her stomach [phone]

## 2017-01-02 NOTE — Telephone Encounter (Signed)
Patient was here today getting blood work, she states she feel her chol med is to strong it is upsetting her stomach? Can u lower dose or change to something else?

## 2017-01-03 LAB — LIPID PANEL
CHOL/HDL RATIO: 4.1 ratio (ref <5.0)
Cholesterol: 136 mg/dL (ref <200)
HDL: 33 mg/dL — AB (ref >50)
LDL CALC: 85 mg/dL (ref <100)
Triglycerides: 92 mg/dL (ref <150)
VLDL: 18 mg/dL (ref <30)

## 2017-01-03 LAB — ALT: ALT: 11 U/L (ref 6–29)

## 2017-01-04 ENCOUNTER — Telehealth: Payer: Self-pay

## 2017-01-04 ENCOUNTER — Other Ambulatory Visit: Payer: Self-pay

## 2017-01-04 ENCOUNTER — Other Ambulatory Visit: Payer: Self-pay | Admitting: Family Medicine

## 2017-01-04 DIAGNOSIS — Z5181 Encounter for therapeutic drug level monitoring: Secondary | ICD-10-CM

## 2017-01-04 DIAGNOSIS — E782 Mixed hyperlipidemia: Secondary | ICD-10-CM

## 2017-01-04 MED ORDER — ATORVASTATIN CALCIUM 40 MG PO TABS: 40.0000 mg | ORAL_TABLET | Freq: Every day | ORAL | 1 refills | Status: DC

## 2017-01-04 MED ORDER — PITAVASTATIN CALCIUM 2 MG PO TABS: ORAL_TABLET | ORAL | 2 refills | Status: DC

## 2017-01-04 NOTE — Telephone Encounter (Signed)
Patient called states the Livalo is $100 and is to expensive, she wanted to know if she can continue taking what she was just lower the dose?

## 2017-01-04 NOTE — Telephone Encounter (Signed)
The problem is that the lower dose wasn't working; her previous cholesterol was high I'll work with her, though, and let her go back to the 40 mg strength if she'll really buckle down and watch her diet and try to lose 10 pounds I sent new Rx with note to pharmacist for 40 mg atorvastatin Still check her lipids and sgpt in 6 weeks

## 2017-01-04 NOTE — Progress Notes (Signed)
Change to another statin Recheck labs in 6 weeks

## 2017-01-05 NOTE — Telephone Encounter (Signed)
Patient notified

## 2017-02-15 ENCOUNTER — Other Ambulatory Visit: Payer: Self-pay | Admitting: Family Medicine

## 2017-02-15 DIAGNOSIS — Z5181 Encounter for therapeutic drug level monitoring: Secondary | ICD-10-CM | POA: Diagnosis not present

## 2017-02-15 DIAGNOSIS — E782 Mixed hyperlipidemia: Secondary | ICD-10-CM | POA: Diagnosis not present

## 2017-02-16 ENCOUNTER — Other Ambulatory Visit: Payer: Self-pay | Admitting: Family Medicine

## 2017-02-16 LAB — LIPID PANEL
Cholesterol: 135 mg/dL (ref <200)
HDL: 38 mg/dL — ABNORMAL LOW (ref >50)
LDL CALC: 78 mg/dL (ref <100)
TRIGLYCERIDES: 93 mg/dL (ref <150)
Total CHOL/HDL Ratio: 3.6 Ratio (ref <5.0)
VLDL: 19 mg/dL (ref <30)

## 2017-02-16 LAB — ALT: ALT: 13 U/L (ref 6–29)

## 2017-02-16 MED ORDER — ATORVASTATIN CALCIUM 40 MG PO TABS: 40.0000 mg | ORAL_TABLET | Freq: Every day | ORAL | 6 refills | Status: DC

## 2017-02-27 ENCOUNTER — Other Ambulatory Visit: Payer: Self-pay | Admitting: Family Medicine

## 2017-02-27 NOTE — Telephone Encounter (Signed)
Last two BPs reviewed; Rx approved

## 2017-03-07 DIAGNOSIS — H40003 Preglaucoma, unspecified, bilateral: Secondary | ICD-10-CM | POA: Diagnosis not present

## 2017-04-12 ENCOUNTER — Ambulatory Visit: Payer: Medicare HMO | Admitting: Family Medicine

## 2017-04-19 ENCOUNTER — Ambulatory Visit (INDEPENDENT_AMBULATORY_CARE_PROVIDER_SITE_OTHER): Payer: Medicare HMO | Admitting: Family Medicine

## 2017-04-19 ENCOUNTER — Encounter: Payer: Self-pay | Admitting: Family Medicine

## 2017-04-19 VITALS — BP 138/78 | HR 91 | Temp 98.0°F | Resp 14 | Wt 167.2 lb

## 2017-04-19 DIAGNOSIS — E119 Type 2 diabetes mellitus without complications: Secondary | ICD-10-CM | POA: Diagnosis not present

## 2017-04-19 DIAGNOSIS — Z2821 Immunization not carried out because of patient refusal: Secondary | ICD-10-CM

## 2017-04-19 DIAGNOSIS — I10 Essential (primary) hypertension: Secondary | ICD-10-CM | POA: Diagnosis not present

## 2017-04-19 DIAGNOSIS — E782 Mixed hyperlipidemia: Secondary | ICD-10-CM | POA: Diagnosis not present

## 2017-04-19 NOTE — Patient Instructions (Signed)
I do recommend yearly flu shots; for individuals who don't want flu shots, try to practice excellent hand hygiene, and avoid nursing homes, day cares, and hospitals during peak flu season; taking additional vitamin C daily during flu/cold season may help boost your immune system too We'll get labs today and contact you about those results Keep up the great job

## 2017-04-19 NOTE — Progress Notes (Signed)
BP 138/78   Pulse 91   Temp 98 F (36.7 C) (Oral)   Resp 14   Wt 167 lb 3.2 oz (75.8 kg)   SpO2 97%   BMI 25.42 kg/m    Subjective:    Patient ID: Angela Horton, female    DOB: 27-Sep-1940, 76 y.o.   MRN: [id number]  HPI: Angela Horton is a 76 y.o. female  Chief Complaint  Patient presents with  . Follow-up    6 month    HPI Type 2 diabetes; not checking FSBS; goes Monday to see the eye doctor; no problems with her feet Lab Results  Component Value Date   HGBA1C 6.5 (H) 10/11/2016   Hypertension; on three medicines for this; not adding salt to her food  High cholesterol Lab Results  Component Value Date   CHOL 135 02/15/2017   HDL 38 (L) 02/15/2017   LDLCALC 78 02/15/2017   TRIG 93 02/15/2017   CHOLHDL 3.6 02/15/2017  Not eating eggs; bacon once or twice a week; rarely eats hamburgers; grilled chicken; loves greens, not much junk food at all   She cannot take flu shots; she took one and got sick one time so doesn't get them any more  Depression screen [facility] 2/9 04/19/2017 10/11/2016 03/23/2016 10/12/2015 05/20/2015  Decreased Interest 0 0 0 0 0  Down, Depressed, Hopeless 0 0 1 0 0  PHQ - 2 Score 0 0 1 0 0    Relevant past medical, surgical, family and social history reviewed Past Medical History:  Diagnosis Date  . Diabetes mellitus without complication ([place])   . Gout   . Hyperlipidemia   . Hypertension   . Obesity    Past Surgical History:  Procedure Laterality Date  . ABDOMINAL HYSTERECTOMY     Family History  Problem Relation Age of Onset  . Kidney disease Mother   . Kidney disease Sister   . Stroke Brother   . Heart disease Sister   . Stroke Sister    Social History   Social History  . Marital status: Married    Spouse name: N/A  . Number of children: N/A  . Years of education: N/A   Occupational History  . Not on file.   Social History Main Topics  . Smoking status: Never Smoker  . Smokeless tobacco: Never Used  . Alcohol use  No  . Drug use: No  . Sexual activity: Yes   Other Topics Concern  . Not on file   Social History Narrative  . No narrative on file    Interim medical history since last visit reviewed. Allergies and medications reviewed  Review of Systems  Respiratory: Negative for shortness of breath.   Cardiovascular: Negative for chest pain.   Per HPI unless specifically indicated above     Objective:    BP 138/78   Pulse 91   Temp 98 F (36.7 C) (Oral)   Resp 14   Wt 167 lb 3.2 oz (75.8 kg)   SpO2 97%   BMI 25.42 kg/m   Wt Readings from Last 3 Encounters:  04/19/17 167 lb 3.2 oz (75.8 kg)  10/11/16 163 lb 9.6 oz (74.2 kg)  03/23/16 167 lb (75.8 kg)    Physical Exam  Constitutional: She appears well-developed and well-nourished. No distress.  HENT:  Head: Normocephalic and atraumatic.  Eyes: EOM are normal. No scleral icterus.  Neck: No thyromegaly present.  Cardiovascular: Normal rate, regular rhythm and normal heart sounds.  No murmur heard. Pulmonary/Chest: Effort normal and breath sounds normal. No respiratory distress. She has no wheezes.  Abdominal: Soft. Bowel sounds are normal. She exhibits no distension.  Musculoskeletal: Normal range of motion. She exhibits no edema.  Neurological: She is alert. She exhibits normal muscle tone.  Skin: Skin is warm and dry. She is not diaphoretic. No pallor.  Psychiatric: She has a normal mood and affect. Her behavior is normal. Judgment and thought content normal.   Diabetic Foot Form - Detailed   Diabetic Foot Exam - detailed Diabetic Foot exam was performed with the following findings:  Yes 04/19/2017 10:40 AM  Visual Foot Exam completed.:  Yes  Pulse Foot Exam completed.:  Yes  Right Dorsalis Pedis:  Present Left Dorsalis Pedis:  Present  Sensory Foot Exam Completed.:  Yes Semmes-Weinstein Monofilament Test R Site 1-Great Toe:  Pos L Site 1-Great Toe:  Pos        Results for orders placed or performed in visit on  02/15/17  Lipid panel  Result Value Ref Range   Cholesterol 135 <200 mg/dL   Triglycerides 93 <150 mg/dL   HDL 38 (L) >50 mg/dL   Total CHOL/HDL Ratio 3.6 <5.0 Ratio   VLDL 19 <30 mg/dL   LDL Cholesterol 78 <100 mg/dL  ALT  Result Value Ref Range   ALT 13 6 - 29 U/L      Assessment & Plan:   Problem List Items Addressed This Visit      Cardiovascular and Mediastinum   Essential hypertension    Well-controlled; not adding salt to her food        Endocrine   Type 2 diabetes mellitus without complication ([place]) - Primary    Under good control; foot exam by MD and normal; check A1c      Relevant Orders   Hemoglobin A1c   Microalbumin / creatinine urine ratio   Basic metabolic panel     Other   Hyperlipidemia    Checked fasting lipids in August; praise given for healthy eating; continue medicine; try to walk a little more and start slow and go up very gradually to help raise the HDL       Other Visit Diagnoses    Influenza vaccination declined by patient       see AVS       Follow up plan: Return in about 6 months (around 10/18/2017) for twenty minute follow-up with fasting labs.  An after-visit summary was printed and given to the patient at [place].  Please see the patient instructions which may contain other information and recommendations beyond what is mentioned above in the assessment and plan.  No orders of the defined types were placed in this encounter.   Orders Placed This Encounter  Procedures  . Hemoglobin A1c  . Microalbumin / creatinine urine ratio  . Basic metabolic panel

## 2017-04-19 NOTE — Assessment & Plan Note (Addendum)
Checked fasting lipids in August; praise given for healthy eating; continue medicine; try to walk a little more and start slow and go up very gradually to help raise the HDL

## 2017-04-19 NOTE — Assessment & Plan Note (Signed)
Well-controlled; not adding salt to her food

## 2017-04-19 NOTE — Assessment & Plan Note (Signed)
Under good control; foot exam by MD and normal; check A1c

## 2017-04-20 ENCOUNTER — Telehealth: Payer: Self-pay

## 2017-04-20 LAB — BASIC METABOLIC PANEL
BUN: 12 mg/dL (ref 7–25)
CALCIUM: 9.9 mg/dL (ref 8.6–10.4)
CO2: 28 mmol/L (ref 20–32)
Chloride: 99 mmol/L (ref 98–110)
Creat: 0.92 mg/dL (ref 0.60–0.93)
GLUCOSE: 121 mg/dL (ref 65–139)
POTASSIUM: 3.9 mmol/L (ref 3.5–5.3)
Sodium: 136 mmol/L (ref 135–146)

## 2017-04-20 LAB — HEMOGLOBIN A1C
EAG (MMOL/L): 8.4 (calc)
Hgb A1c MFr Bld: 6.9 % of total Hgb — ABNORMAL HIGH (ref <5.7)
MEAN PLASMA GLUCOSE: 151 (calc)

## 2017-04-20 LAB — MICROALBUMIN / CREATININE URINE RATIO
Creatinine, Urine: 97 mg/dL (ref 20–275)
MICROALB UR: 0.9 mg/dL
MICROALB/CREAT RATIO: 9 ug/mg{creat} (ref <30)

## 2017-04-20 NOTE — Telephone Encounter (Signed)
-----   Message from Arnetha Courser, MD sent at 04/20/2017  8:07 AM EDT ----- Please let pt know that her diabetes is controlled; keep working on healthy eating; she is not spilling excessive protein through her kidneys (good news); her kidney function is stable; thank you

## 2017-04-20 NOTE — Telephone Encounter (Signed)
Called pt informed her of normal results below. Pt gave verbal understanding.

## 2017-04-23 ENCOUNTER — Other Ambulatory Visit: Payer: Self-pay

## 2017-04-23 DIAGNOSIS — H40003 Preglaucoma, unspecified, bilateral: Secondary | ICD-10-CM | POA: Diagnosis not present

## 2017-04-23 MED ORDER — CHLORTHALIDONE 25 MG PO TABS: 25.0000 mg | ORAL_TABLET | Freq: Every day | ORAL | 3 refills | Status: DC

## 2017-04-23 NOTE — Telephone Encounter (Signed)
Last labs reviewed; Rx approved

## 2017-04-23 NOTE — Telephone Encounter (Signed)
90 day

## 2017-05-03 ENCOUNTER — Other Ambulatory Visit: Payer: Self-pay

## 2017-05-03 DIAGNOSIS — I10 Essential (primary) hypertension: Secondary | ICD-10-CM

## 2017-05-03 MED ORDER — BENAZEPRIL HCL 20 MG PO TABS: 20.0000 mg | ORAL_TABLET | Freq: Every day | ORAL | 6 refills | Status: DC

## 2017-05-03 NOTE — Telephone Encounter (Signed)
Last Cr and K+ reviewed; Rx approved 

## 2017-07-19 ENCOUNTER — Other Ambulatory Visit: Payer: Self-pay

## 2017-07-19 MED ORDER — ATORVASTATIN CALCIUM 40 MG PO TABS: 40.0000 mg | ORAL_TABLET | Freq: Every day | ORAL | 8 refills | Status: DC

## 2017-07-19 NOTE — Telephone Encounter (Signed)
Last SGPT and lipids reviewed Statin approved

## 2017-08-17 ENCOUNTER — Other Ambulatory Visit: Payer: Self-pay | Admitting: Family Medicine

## 2017-08-17 NOTE — Telephone Encounter (Signed)
Request for chlorthalidone received I prescribed 90+3 to same pharmacy on 04/23/17 She should have plenty Denied, note to pharmacy

## 2017-09-21 ENCOUNTER — Other Ambulatory Visit: Payer: Self-pay | Admitting: Family Medicine

## 2017-10-19 ENCOUNTER — Ambulatory Visit (INDEPENDENT_AMBULATORY_CARE_PROVIDER_SITE_OTHER): Payer: Medicare HMO | Admitting: Family Medicine

## 2017-10-19 ENCOUNTER — Encounter: Payer: Self-pay | Admitting: Family Medicine

## 2017-10-19 VITALS — BP 122/76 | HR 78 | Temp 98.3°F | Resp 14 | Ht 68.0 in | Wt 164.5 lb

## 2017-10-19 DIAGNOSIS — Z5181 Encounter for therapeutic drug level monitoring: Secondary | ICD-10-CM | POA: Diagnosis not present

## 2017-10-19 DIAGNOSIS — E782 Mixed hyperlipidemia: Secondary | ICD-10-CM

## 2017-10-19 DIAGNOSIS — I10 Essential (primary) hypertension: Secondary | ICD-10-CM

## 2017-10-19 DIAGNOSIS — E119 Type 2 diabetes mellitus without complications: Secondary | ICD-10-CM

## 2017-10-19 NOTE — Assessment & Plan Note (Signed)
Check lipids today; try to limit saturated fats; continue statin

## 2017-10-19 NOTE — Progress Notes (Signed)
BP 122/76   Pulse 78   Temp 98.3 F (36.8 C) (Oral)   Resp 14   Ht 5\' 8"  (1.727 m)   Wt 164 lb 8 oz (74.6 kg)   SpO2 96%   BMI 25.01 kg/m    Subjective:    Patient ID: Angela Horton, female    DOB: [birth year], 77 y.o.   MRN: [id number]  HPI: Angela Horton is a 77 y.o. female  Chief Complaint  Patient presents with  . Follow-up  . Shoulder Pain    left, had a fall while carring big case of water    HPI Patient is here for f/u Type 2 DM; not checking FSBS with my blessing; no dry mouth; seeing eye doctor HTN; well-controlled; tries to stay away from salt; no chest pain High cholesterol; just eats a little bacon once in a while, rare sausage; no cheese; on statin, no muscle aches She fell while carrying a big case of water; got out of her water and got up on porch and slipped and dropped the water; "I'm fine"; did not hurt anything but her shoulder a little bit, no problems  Depression screen [facility] 2/9 10/19/2017 04/19/2017 10/11/2016 03/23/2016 10/12/2015  Decreased Interest 0 0 0 0 0  Down, Depressed, Hopeless 0 0 0 1 0  PHQ - 2 Score 0 0 0 1 0    Relevant past medical, surgical, family and social history reviewed Past Medical History:  Diagnosis Date  . Diabetes mellitus without complication ([place])   . Gout   . Hyperlipidemia   . Hypertension   . Obesity    Past Surgical History:  Procedure Laterality Date  . ABDOMINAL HYSTERECTOMY     Family History  Problem Relation Age of Onset  . Kidney disease Mother   . Kidney disease Sister   . Stroke Brother   . Heart disease Sister   . Stroke Sister    Social History   Tobacco Use  . Smoking status: Never Smoker  . Smokeless tobacco: Never Used  Substance Use Topics  . Alcohol use: No    Alcohol/week: 0.0 oz  . Drug use: No    Interim medical history since last visit reviewed. Allergies and medications reviewed  Review of Systems Per HPI unless specifically indicated above     Objective:    BP  122/76   Pulse 78   Temp 98.3 F (36.8 C) (Oral)   Resp 14   Ht 5\' 8"  (1.727 m)   Wt 164 lb 8 oz (74.6 kg)   SpO2 96%   BMI 25.01 kg/m   Wt Readings from Last 3 Encounters:  10/19/17 164 lb 8 oz (74.6 kg)  04/19/17 167 lb 3.2 oz (75.8 kg)  10/11/16 163 lb 9.6 oz (74.2 kg)    Physical Exam  Constitutional: She appears well-developed and well-nourished. No distress.  HENT:  Head: Normocephalic and atraumatic.  Eyes: EOM are normal. No scleral icterus.  Neck: No thyromegaly present.  Cardiovascular: Normal rate, regular rhythm and normal heart sounds.  No murmur heard. Pulmonary/Chest: Effort normal and breath sounds normal. No respiratory distress. She has no wheezes.  Abdominal: Soft. Bowel sounds are normal. She exhibits no distension.  Musculoskeletal: Normal range of motion. She exhibits no edema.  Neurological: She is alert. She exhibits normal muscle tone.  Skin: Skin is warm and dry. She is not diaphoretic. No pallor.  Psychiatric: She has a normal mood and affect. Her behavior is normal. Judgment and  thought content normal.   Diabetic Foot Form - Detailed   Diabetic Foot Exam - detailed Diabetic Foot exam was performed with the following findings:  Yes 10/19/2017 10:22 AM  Visual Foot Exam completed.:  Yes  Pulse Foot Exam completed.:  Yes  Right Dorsalis Pedis:  Present Left Dorsalis Pedis:  Present  Sensory Foot Exam Completed.:  Yes Semmes-Weinstein Monofilament Test R Site 1-Great Toe:  Pos L Site 1-Great Toe:  Pos        Results for orders placed or performed in visit on 04/19/17  Hemoglobin A1c  Result Value Ref Range   Hgb A1c MFr Bld 6.9 (H) <5.7 % of total Hgb   Mean Plasma Glucose 151 (calc)   eAG (mmol/L) 8.4 (calc)  Microalbumin / creatinine urine ratio  Result Value Ref Range   Creatinine, Urine 97 20 - 275 mg/dL   Microalb, Ur 0.9 mg/dL   Microalb Creat Ratio 9 <30 mcg/mg creat  Basic metabolic panel  Result Value Ref Range   Glucose, Bld 121  65 - 139 mg/dL   BUN 12 7 - 25 mg/dL   Creat 0.92 0.60 - 0.93 mg/dL   BUN/Creatinine Ratio NOT APPLICABLE 6 - 22 (calc)   Sodium 136 135 - 146 mmol/L   Potassium 3.9 3.5 - 5.3 mmol/L   Chloride 99 98 - 110 mmol/L   CO2 28 20 - 32 mmol/L   Calcium 9.9 8.6 - 10.4 mg/dL      Assessment & Plan:   Problem List Items Addressed This Visit      Cardiovascular and Mediastinum   Essential hypertension    Well-controlled; continue same medicines; limit salt; try the DASH guidelines        Endocrine   Type 2 diabetes mellitus without complication ([place]) - Primary    Foot exam by MD; eye exam UTD; check A1c; last urine micralb:Cr was normal      Relevant Orders   Hemoglobin A1c     Other   Medication monitoring encounter    Watch liver and kidneys      Relevant Orders   COMPLETE METABOLIC PANEL WITH GFR   Hyperlipidemia    Check lipids today; try to limit saturated fats; continue statin      Relevant Orders   Lipid panel      Follow up plan: Return in about 6 months (around 04/20/2018) for follow-up visit with Dr. Sanda Klein; Medicare Wellness visit with Ammie soon.  An after-visit summary was printed and given to the patient at [place].  Please see the patient instructions which may contain other information and recommendations beyond what is mentioned above in the assessment and plan.  No orders of the defined types were placed in this encounter.   Orders Placed This Encounter  Procedures  . Lipid panel  . Hemoglobin A1c  . COMPLETE METABOLIC PANEL WITH GFR

## 2017-10-19 NOTE — Assessment & Plan Note (Signed)
Watch liver and kidneys 

## 2017-10-19 NOTE — Assessment & Plan Note (Signed)
Foot exam by MD; eye exam UTD; check A1c; last urine micralb:Cr was normal

## 2017-10-19 NOTE — Patient Instructions (Signed)
Try to follow the DASH guidelines (DASH stands for Dietary Approaches to Stop Hypertension). Try to limit the sodium in your diet to no more than 1,500mg  of sodium per day. Certainly try to not exceed 2,000 mg per day at the very most. Do not add salt when cooking or at the table.  Check the sodium amount on labels when shopping, and choose items lower in sodium when given a choice. Avoid or limit foods that already contain a lot of sodium. Eat a diet rich in fruits and vegetables and whole grains, and try to lose weight if overweight or obese Please do see your eye doctor regularly, and have your eyes examined every year (or more often per his or her recommendation) Check your feet every night and let me know right away of any sores, infections, numbness, etc. Try to limit sweets, white bread, white rice, white potatoes It is okay with me for you to not check your fingerstick blood sugars (per [employer] of Endocrinology Best Practices), unless you are interested and feel it would be helpful for you

## 2017-10-19 NOTE — Assessment & Plan Note (Signed)
Well-controlled; continue same medicines; limit salt; try the DASH guidelines

## 2017-10-20 LAB — COMPLETE METABOLIC PANEL WITH GFR
AG Ratio: 1.2 (calc) (ref 1.0–2.5)
ALBUMIN MSPROF: 4.3 g/dL (ref 3.6–5.1)
ALKALINE PHOSPHATASE (APISO): 92 U/L (ref 33–130)
ALT: 12 U/L (ref 6–29)
AST: 40 U/L — AB (ref 10–35)
BILIRUBIN TOTAL: 0.6 mg/dL (ref 0.2–1.2)
BUN / CREAT RATIO: 11 (calc) (ref 6–22)
BUN: 11 mg/dL (ref 7–25)
CHLORIDE: 97 mmol/L — AB (ref 98–110)
CO2: 28 mmol/L (ref 20–32)
Calcium: 9.8 mg/dL (ref 8.6–10.4)
Creat: 1 mg/dL — ABNORMAL HIGH (ref 0.60–0.93)
GFR, Est African American: 62 mL/min/{1.73_m2} (ref >=60)
GFR, Est Non African American: 54 mL/min/{1.73_m2} — ABNORMAL LOW (ref >=60)
Globulin: 3.5 g/dL (calc) (ref 1.9–3.7)
Glucose, Bld: 136 mg/dL — ABNORMAL HIGH (ref 65–99)
Potassium: 3.6 mmol/L (ref 3.5–5.3)
Sodium: 134 mmol/L — ABNORMAL LOW (ref 135–146)
Total Protein: 7.8 g/dL (ref 6.1–8.1)

## 2017-10-20 LAB — LIPID PANEL
CHOL/HDL RATIO: 4.2 (calc) (ref <5.0)
Cholesterol: 144 mg/dL (ref <200)
HDL: 34 mg/dL — ABNORMAL LOW (ref >50)
LDL CHOLESTEROL (CALC): 94 mg/dL
Non-HDL Cholesterol (Calc): 110 mg/dL (calc) (ref <130)
TRIGLYCERIDES: 72 mg/dL (ref <150)

## 2017-10-20 LAB — HEMOGLOBIN A1C
EAG (MMOL/L): 8.5 (calc)
HEMOGLOBIN A1C: 7 %{Hb} — AB (ref <5.7)
MEAN PLASMA GLUCOSE: 154 (calc)

## 2017-10-22 ENCOUNTER — Other Ambulatory Visit: Payer: Self-pay | Admitting: Family Medicine

## 2017-10-22 DIAGNOSIS — H40003 Preglaucoma, unspecified, bilateral: Secondary | ICD-10-CM | POA: Diagnosis not present

## 2017-10-22 LAB — HM DIABETES EYE EXAM

## 2017-10-22 MED ORDER — METFORMIN HCL ER 500 MG PO TB24: 500.0000 mg | ORAL_TABLET | Freq: Every day | ORAL | 2 refills | Status: DC

## 2017-10-22 NOTE — Progress Notes (Signed)
Add metformin

## 2017-10-23 ENCOUNTER — Telehealth: Payer: Self-pay

## 2017-10-23 DIAGNOSIS — R7401 Elevation of levels of liver transaminase levels: Secondary | ICD-10-CM

## 2017-10-23 DIAGNOSIS — R740 Nonspecific elevation of levels of transaminase and lactic acid dehydrogenase [LDH]: Principal | ICD-10-CM

## 2017-10-23 NOTE — Telephone Encounter (Signed)
-----   Message from Arnetha Courser, MD sent at 10/22/2017  4:40 PM EDT ----- Angela Horton, please let the patient know that her total cholesterol is great; her HDL needs to come up a little more, so perhaps she could increase her walking (build up grdadually); her A1c is 7, so we'd like it just a little less; let's add a pill to help control her sugars a little better, and encourage her to avoid white bread, white rice, sweets; return in 3 months instead of 6 months; one liver enzyme went up a little; if she drinks alcohol, please encourage her to stop; recheck hepatic function panel in 4 weeks (please ORDER, dx elevated SGOT/AST); thank you

## 2017-10-24 ENCOUNTER — Other Ambulatory Visit: Payer: Self-pay | Admitting: Family Medicine

## 2017-10-24 DIAGNOSIS — I10 Essential (primary) hypertension: Secondary | ICD-10-CM

## 2017-10-24 NOTE — Telephone Encounter (Signed)
Too soon for ACE-I

## 2017-10-26 ENCOUNTER — Telehealth: Payer: Self-pay | Admitting: Family Medicine

## 2017-10-26 MED ORDER — CHLORTHALIDONE 50 MG PO TABS: 25.0000 mg | ORAL_TABLET | Freq: Every day | ORAL | 1 refills | Status: DC

## 2017-10-26 NOTE — Telephone Encounter (Signed)
I called pharmacy personally They have 50 mg tabs that are scored Will send new Rx right now

## 2017-10-26 NOTE — Telephone Encounter (Unsigned)
Copied from [place]. Topic: Quick Communication - See Telephone Encounter >> Oct 26, 2017 11:56 AM Percell Belt A wrote: CRM for notification. See Telephone encounter for: 10/26/17. Pt called in and stated that the pharmacy no longer has the chlorthalidone (HYGROTON) 25 MG tablet [[id number] and would like to know if dr would like to change meds to something else?   Pharmacy - [facility] in [place]?

## 2017-11-04 ENCOUNTER — Other Ambulatory Visit: Payer: Self-pay | Admitting: Family Medicine

## 2017-11-04 DIAGNOSIS — I10 Essential (primary) hypertension: Secondary | ICD-10-CM

## 2017-11-19 ENCOUNTER — Other Ambulatory Visit: Payer: Self-pay

## 2017-11-19 ENCOUNTER — Other Ambulatory Visit: Payer: Self-pay | Admitting: Family Medicine

## 2017-11-19 DIAGNOSIS — R7401 Elevation of levels of liver transaminase levels: Secondary | ICD-10-CM

## 2017-11-19 DIAGNOSIS — R740 Nonspecific elevation of levels of transaminase and lactic acid dehydrogenase [LDH]: Secondary | ICD-10-CM | POA: Diagnosis not present

## 2017-11-19 LAB — HEPATIC FUNCTION PANEL
AG Ratio: 1.1 (calc) (ref 1.0–2.5)
ALBUMIN MSPROF: 4 g/dL (ref 3.6–5.1)
ALT: 10 U/L (ref 6–29)
AST: 35 U/L (ref 10–35)
Alkaline phosphatase (APISO): 78 U/L (ref 33–130)
BILIRUBIN DIRECT: 0.1 mg/dL (ref 0.0–0.2)
BILIRUBIN TOTAL: 0.6 mg/dL (ref 0.2–1.2)
GLOBULIN: 3.6 g/dL (ref 1.9–3.7)
Indirect Bilirubin: 0.5 mg/dL (calc) (ref 0.2–1.2)
Total Protein: 7.6 g/dL (ref 6.1–8.1)

## 2017-11-22 ENCOUNTER — Telehealth: Payer: Self-pay | Admitting: Family Medicine

## 2017-11-22 NOTE — Telephone Encounter (Signed)
Copied from [place] [id number]. Topic: Quick Communication - Lab Results >> Nov 20, 2017  9:18 AM Cathrine Muster, CMA wrote: Called patient to inform them of  lab results. When patient returns call, triage nurse may disclose results.   Please contact pt. [phone]

## 2017-11-30 ENCOUNTER — Ambulatory Visit: Payer: Self-pay | Admitting: *Deleted

## 2017-11-30 ENCOUNTER — Ambulatory Visit: Payer: Self-pay

## 2017-11-30 MED ORDER — SITAGLIPTIN PHOSPHATE 50 MG PO TABS: 50.0000 mg | ORAL_TABLET | Freq: Every day | ORAL | 5 refills | Status: DC

## 2017-11-30 NOTE — Telephone Encounter (Signed)
Stop the medicine, list as adverse reaction, and have her go to urgent care if she feels badly enough; otherwise, appt with me or Benjamine Mola next week

## 2017-11-30 NOTE — Addendum Note (Signed)
Addended by: LADA, Satira Anis on: 11/30/2017 04:56 PM   Modules accepted: Orders

## 2017-11-30 NOTE — Addendum Note (Signed)
Addended by: Fortune Brannigan, Satira Anis on: 11/30/2017 01:40 PM   Modules accepted: Orders

## 2017-11-30 NOTE — Telephone Encounter (Signed)
Yes, good idea Let's have her start [place] Rx sent

## 2017-11-30 NOTE — Telephone Encounter (Signed)
I think it would be wise to see her, but if she wants to see how things go off the medicine and then let us know, I understand; I just can't determine if her symptoms are from the medicine or something else without more history and a physical exam; thank you

## 2017-11-30 NOTE — Telephone Encounter (Signed)
Called in c/o Metformin XR 500 mg upsetting her stomach, giving her headaches and making her feel very nervous.   See triage notes below.  I have routed a high priority note to Dr. Sanda Klein making her aware.  I let pt know someone from the office will be in contact with her.   She verbalized understanding and was agreeable to this plan.    Reason for Disposition . Caller has URGENT medication question about med that PCP prescribed and triager unable to answer question  Answer Assessment - Initial Assessment Questions 1. SYMPTOMS: "Do you have any symptoms?"     I've been on Metformin for 2 weeks.   It's upsets my stomach, headaches, and makes me nervous.  It also makes me sweat on and off too. 2. SEVERITY: If symptoms are present, ask "Are they mild, moderate or severe?"     Been going on for a week or two.    No vomiting but it upsets my stomach makes me feel nauseas and some stomach pain a little.  It's making me real nervous.  Protocols used: MEDICATION QUESTION CALL-A-AH

## 2017-11-30 NOTE — Telephone Encounter (Signed)
notified

## 2017-11-30 NOTE — Telephone Encounter (Signed)
Telephone call from pt. With a medication question. Patient states that the nurse informed her to stop taking Metformin,was wondering if she is suppose to take another medicatio in replacement of it. Inform patient that the Dr. Lynnda Child be int touch with her. Recommended patient to continue to monitor blood sugar levels. Pt. voiced understanding.      Reason for Disposition . Caller has NON-URGENT medication question about med that PCP prescribed and triager unable to answer question  Answer Assessment - Initial Assessment Questions 1. SYMPTOMS: "Do you have any symptoms?"     no 2. SEVERITY: If symptoms are present, ask "Are they mild, moderate or severe?"     na  Protocols used: MEDICATION QUESTION CALL-A-AH

## 2017-11-30 NOTE — Telephone Encounter (Signed)
Pt.notified

## 2017-11-30 NOTE — Telephone Encounter (Signed)
Pt states does she need to come back in? Or only if she does not feel better?

## 2017-12-18 ENCOUNTER — Telehealth: Payer: Self-pay

## 2017-12-18 NOTE — Telephone Encounter (Signed)
Called pt to reschedule AWV w/ [facility]. Unable to reach pt d/t no answer.

## 2018-01-18 ENCOUNTER — Other Ambulatory Visit: Payer: Self-pay | Admitting: Family Medicine

## 2018-01-21 ENCOUNTER — Other Ambulatory Visit: Payer: Self-pay | Admitting: Family Medicine

## 2018-01-23 ENCOUNTER — Ambulatory Visit: Payer: Self-pay | Admitting: Family Medicine

## 2018-01-24 ENCOUNTER — Encounter: Payer: Self-pay | Admitting: Family Medicine

## 2018-01-24 ENCOUNTER — Ambulatory Visit (INDEPENDENT_AMBULATORY_CARE_PROVIDER_SITE_OTHER): Payer: Medicare HMO | Admitting: Family Medicine

## 2018-01-24 DIAGNOSIS — I10 Essential (primary) hypertension: Secondary | ICD-10-CM | POA: Diagnosis not present

## 2018-01-24 DIAGNOSIS — E782 Mixed hyperlipidemia: Secondary | ICD-10-CM

## 2018-01-24 DIAGNOSIS — E119 Type 2 diabetes mellitus without complications: Secondary | ICD-10-CM | POA: Diagnosis not present

## 2018-01-24 DIAGNOSIS — Z5181 Encounter for therapeutic drug level monitoring: Secondary | ICD-10-CM | POA: Diagnosis not present

## 2018-01-24 LAB — POCT GLYCOSYLATED HEMOGLOBIN (HGB A1C): Hemoglobin A1C: 6.4 % — AB (ref 4.0–5.6)

## 2018-01-24 NOTE — Assessment & Plan Note (Addendum)
Check A1c today; good control; continue current strategy; avoid sugary drinks; switch from regular ginger ale to diet, e.g.; foot exam by MD

## 2018-01-24 NOTE — Patient Instructions (Addendum)
Please do see your eye doctor regularly, and have your eyes examined every year (or more often per his or her recommendation) Check your feet every night and let me know right away of any sores, infections, numbness, etc. Try to limit sweets, white bread, white rice, white potatoes It is okay with me for you to not check your fingerstick blood sugars (per [employer] of Endocrinology Best Practices), unless you are interested and feel it would be helpful for you  Try to follow the DASH guidelines (DASH stands for Dietary Approaches to Stop Hypertension). Try to limit the sodium in your diet to no more than 1,500mg  of sodium per day. Certainly try to not exceed 2,000 mg per day at the very most. Do not add salt when cooking or at the table.  Check the sodium amount on labels when shopping, and choose items lower in sodium when given a choice. Avoid or limit foods that already contain a lot of sodium. Eat a diet rich in fruits and vegetables and whole grains, and try to lose weight if overweight or obese  Keep up the good work! We'll see you back here in 6 months

## 2018-01-24 NOTE — Progress Notes (Signed)
BP 124/64   Pulse 86   Temp 98.5 F (36.9 C) (Oral)   Resp 12   Ht 5\' 8"  (1.727 m)   Wt 161 lb 6.4 oz (73.2 kg)   SpO2 98%   BMI 24.54 kg/m    Subjective:    Patient ID: Angela Horton, female    DOB: 1940/10/22, 77 y.o.   MRN: [id number]  HPI: Angela Horton is a 77 y.o. female  Chief Complaint  Patient presents with  . Diabetes  . Hypertension  . Hyperlipidemia   HPI Patient is here for f/u  Type 2 diabetes; no problems with feet; not checking FSBS with my blessing; drinking regular ginger ale but will try diet; check today; last A1c was 7 (April 19th)  High blood pressure Controlled; on chlorthalidone, CCB, ACE-I; [facility]; usually systolic in the 409W; not adding any salt to her food; no chest pain or trouble breathing  High cholesterol; on statin; tries to avoid fatty meats; might have two slices on bacon once a week; eats oatmeal mostly for breakfast; no myalgias; she preferred to not get labs today b/c of cost Lab Results  Component Value Date   CHOL 144 10/19/2017   HDL 34 (L) 10/19/2017   LDLCALC 94 10/19/2017   TRIG 72 10/19/2017   CHOLHDL 4.2 10/19/2017   Depression screen PHQ 2/9 01/24/2018 10/19/2017 04/19/2017 10/11/2016 03/23/2016  Decreased Interest 0 0 0 0 0  Down, Depressed, Hopeless 0 0 0 0 1  PHQ - 2 Score 0 0 0 0 1   Relevant past medical, surgical, family and social history reviewed Past Medical History:  Diagnosis Date  . Diabetes mellitus without complication ([place])   . Gout   . Hyperlipidemia   . Hypertension   . Obesity    Past Surgical History:  Procedure Laterality Date  . ABDOMINAL HYSTERECTOMY     Family History  Problem Relation Age of Onset  . Kidney disease Mother   . Kidney disease Sister   . Stroke Brother   . Heart disease Sister   . Stroke Sister    Social History   Tobacco Use  . Smoking status: Never Smoker  . Smokeless tobacco: Never Used  Substance Use Topics  . Alcohol use: No    Alcohol/week:  0.0 oz  . Drug use: No   Interim medical history since last visit reviewed. Allergies and medications reviewed  Review of Systems Per HPI unless specifically indicated above     Objective:    BP 124/64   Pulse 86   Temp 98.5 F (36.9 C) (Oral)   Resp 12   Ht 5\' 8"  (1.727 m)   Wt 161 lb 6.4 oz (73.2 kg)   SpO2 98%   BMI 24.54 kg/m   Wt Readings from Last 3 Encounters:  01/24/18 161 lb 6.4 oz (73.2 kg)  10/19/17 164 lb 8 oz (74.6 kg)  04/19/17 167 lb 3.2 oz (75.8 kg)    Physical Exam  Constitutional: She appears well-developed and well-nourished. No distress.  HENT:  Head: Normocephalic and atraumatic.  Eyes: EOM are normal. No scleral icterus.  Neck: No thyromegaly present.  Cardiovascular: Normal rate, regular rhythm and normal heart sounds.  No murmur heard. Pulmonary/Chest: Effort normal and breath sounds normal. No respiratory distress. She has no wheezes.  Abdominal: Soft. She exhibits no distension.  Musculoskeletal: She exhibits no edema.  Neurological: She is alert. She exhibits normal muscle tone.  Skin: Skin is warm  and dry. She is not diaphoretic. No pallor.  Psychiatric: She has a normal mood and affect. Her behavior is normal. Judgment and thought content normal.   Diabetic Foot Form - Detailed   Diabetic Foot Exam - detailed Diabetic Foot exam was performed with the following findings:  Yes 01/24/2018 10:06 AM  Visual Foot Exam completed.:  Yes  Pulse Foot Exam completed.:  Yes  Right Dorsalis Pedis:  Present Left Dorsalis Pedis:  Present  Sensory Foot Exam Completed.:  Yes Semmes-Weinstein Monofilament Test R Site 1-Great Toe:  Pos L Site 1-Great Toe:  Pos        Results for orders placed or performed in visit on 01/24/18  POCT HgB A1C  Result Value Ref Range   Hemoglobin A1C 6.4 (A) 4.0 - 5.6 %   HbA1c POC (<> result, manual entry)  4.0 - 5.6 %   HbA1c, POC (prediabetic range)  5.7 - 6.4 %   HbA1c, POC (controlled diabetic range)  0.0 - 7.0 %       Assessment & Plan:   Problem List Items Addressed This Visit      Cardiovascular and Mediastinum   Essential hypertension    Controlled; continue same medicines, limit salt as she is doing        Endocrine   Type 2 diabetes mellitus without complication (HCC)    Check A1c today; good control; continue current strategy; avoid sugary drinks; switch from regular ginger ale to diet, e.g.; foot exam by MD      Relevant Orders   POCT HgB A1C (Completed)     Other   Medication monitoring encounter    Last Cr and sgpt normal      Hyperlipidemia    Continue medicine and limiting saturated fats; check lipids next visit          Follow up plan: Return in about 6 months (around 07/27/2018) for follow-up visit with Dr. Sanda Klein.  An after-visit summary was printed and given to the patient at [facility].  Please see the patient instructions which may contain other information and recommendations beyond what is mentioned above in the assessment and plan.  No orders of the defined types were placed in this encounter.   Orders Placed This Encounter  Procedures  . POCT HgB A1C

## 2018-01-24 NOTE — Assessment & Plan Note (Signed)
Controlled; continue same medicines, limit salt as she is doing

## 2018-01-24 NOTE — Assessment & Plan Note (Signed)
Last Cr and sgpt normal

## 2018-01-24 NOTE — Assessment & Plan Note (Signed)
Continue medicine and limiting saturated fats; check lipids next visit

## 2018-01-25 ENCOUNTER — Encounter: Payer: Self-pay | Admitting: Family Medicine

## 2018-02-25 ENCOUNTER — Other Ambulatory Visit: Payer: Self-pay | Admitting: Family Medicine

## 2018-02-26 ENCOUNTER — Telehealth: Payer: Self-pay | Admitting: Family Medicine

## 2018-02-26 NOTE — Telephone Encounter (Signed)
Copied from [place] [id number]. Topic: Quick Communication - Rx Refill/Question >> Feb 26, 2018 12:40 PM Keene Breath wrote: Medication: chlorthalidone (HYGROTON) 50 MG tablet [[id number]]    Patient called to request refill for the above medication.  Pharmacy said she is to take 0.5 does of this medication according to doctor.  CB# [phone]  Preferred Pharmacy (with phone number or street name): [id number] [place], [address] [phone] (Phone) [phone] (Fax)

## 2018-03-11 ENCOUNTER — Other Ambulatory Visit: Payer: Self-pay | Admitting: Family Medicine

## 2018-03-20 ENCOUNTER — Other Ambulatory Visit: Payer: Self-pay | Admitting: Family Medicine

## 2018-03-20 DIAGNOSIS — I10 Essential (primary) hypertension: Secondary | ICD-10-CM

## 2018-04-12 ENCOUNTER — Other Ambulatory Visit: Payer: Self-pay | Admitting: Family Medicine

## 2018-04-19 ENCOUNTER — Ambulatory Visit: Payer: Medicare HMO | Admitting: Family Medicine

## 2018-04-24 DIAGNOSIS — E119 Type 2 diabetes mellitus without complications: Secondary | ICD-10-CM | POA: Diagnosis not present

## 2018-04-24 DIAGNOSIS — H40003 Preglaucoma, unspecified, bilateral: Secondary | ICD-10-CM | POA: Diagnosis not present

## 2018-05-03 ENCOUNTER — Other Ambulatory Visit: Payer: Self-pay | Admitting: Family Medicine

## 2018-05-10 ENCOUNTER — Telehealth: Payer: Self-pay | Admitting: Family Medicine

## 2018-05-10 NOTE — Telephone Encounter (Signed)
Copied from [place] [id number]. Topic: Quick Communication - Rx Refill/Question >> May 10, 2018  3:14 PM Sheran Luz wrote: Medication: JANUVIA 50 MG tablet    Patient states this medication is 200$ and she cannot afford it. Patient would like to know if something similar could be called into pharmacy in its place. Please advise.

## 2018-05-16 NOTE — Telephone Encounter (Signed)
This is the oldest (and usually least expensive) of the medicines in that class If the monthly amount is okay, we'll stay with that If assistance is needed to try to get it from the company, please see if C3 care team can help her apply for patient assistance program

## 2018-05-16 NOTE — Telephone Encounter (Addendum)
Relation to pt: self  Call back number: [phone] Pharmacy: [id number] [place], [place] [address]  [address] [place] [address]  Phone: [phone] Fax: [phone]  Not a 24 hour pharmacy; exact hours not known.    Reason for call:  Informed patient to call insurance to inquire what alternates would be cheaper and patient insisted to inform PCP to send any alternate over, please advise

## 2018-05-16 NOTE — Telephone Encounter (Signed)
Patient called to inform Dr. Sanda Klein that the pharmacy said they will give her a 30 day supply of medication for $47 and she will pick that up.  The pharmacy stated that if she got the 3 month supply it would cost over $200.  Patient stated that she will have to get it monthly to avoid the extra costs unless doctor had an alternative for her.  Please advise.  CB# [phone]

## 2018-05-17 NOTE — Telephone Encounter (Signed)
Pt denies c3 and states she is fine with the monthly $47

## 2018-05-17 NOTE — Telephone Encounter (Signed)
Thank you for helping her!

## 2018-05-25 ENCOUNTER — Other Ambulatory Visit: Payer: Self-pay | Admitting: Family Medicine

## 2018-05-25 DIAGNOSIS — I10 Essential (primary) hypertension: Secondary | ICD-10-CM

## 2018-07-21 ENCOUNTER — Other Ambulatory Visit: Payer: Self-pay | Admitting: Nurse Practitioner

## 2018-07-22 NOTE — Telephone Encounter (Signed)
She should not need chlorthalidone refilled yet Will be due in late February

## 2018-07-26 ENCOUNTER — Encounter: Payer: Self-pay | Admitting: Family Medicine

## 2018-07-26 ENCOUNTER — Ambulatory Visit (INDEPENDENT_AMBULATORY_CARE_PROVIDER_SITE_OTHER): Payer: Medicare HMO | Admitting: Family Medicine

## 2018-07-26 VITALS — BP 118/70 | HR 86 | Temp 98.2°F | Resp 12 | Ht 68.0 in | Wt 160.6 lb

## 2018-07-26 DIAGNOSIS — E2839 Other primary ovarian failure: Secondary | ICD-10-CM | POA: Diagnosis not present

## 2018-07-26 DIAGNOSIS — I10 Essential (primary) hypertension: Secondary | ICD-10-CM

## 2018-07-26 DIAGNOSIS — N182 Chronic kidney disease, stage 2 (mild): Secondary | ICD-10-CM | POA: Diagnosis not present

## 2018-07-26 DIAGNOSIS — Z5181 Encounter for therapeutic drug level monitoring: Secondary | ICD-10-CM | POA: Diagnosis not present

## 2018-07-26 DIAGNOSIS — E119 Type 2 diabetes mellitus without complications: Secondary | ICD-10-CM

## 2018-07-26 DIAGNOSIS — E782 Mixed hyperlipidemia: Secondary | ICD-10-CM | POA: Diagnosis not present

## 2018-07-26 NOTE — Assessment & Plan Note (Signed)
Continue medicines; avoid salt; well-controlled

## 2018-07-26 NOTE — Assessment & Plan Note (Signed)
Check lipids; limit fatty foods

## 2018-07-26 NOTE — Assessment & Plan Note (Signed)
Foot exam looked good today; eye exam UTD; check urine and A1c; try to limit sugary drinks; will see what labs show first and then consider if replacement for the $$$ Celesta Gentile is needed

## 2018-07-26 NOTE — Progress Notes (Signed)
BP 118/70   Pulse 86   Temp 98.2 F (36.8 C) (Oral)   Resp 12   Ht 5\' 8"  (1.727 m)   Wt 160 lb 9.6 oz (72.8 kg)   SpO2 98%   BMI 24.42 kg/m    Subjective:    Patient ID: Angela Horton, female    DOB: 05-14-1941, 78 y.o.   MRN: [id number]  HPI: Angela Horton is a 78 y.o. female  Chief Complaint  Patient presents with  . Follow-up  . Diabetes    Januvia to exspensive    HPI Patient is here for f/u Type 2 diabetes; the [place] for prohibitively expensive so did not get; just diet-controlled Last eye exam was April 2019; no diabetic complications with her eyes No dry mouth Tries to limit sugary drinks, but likes ginger ale; mixes ginger ale with water, to dilute it; has not tried diet ginger ale (will be willing to try)  Lab Results  Component Value Date   HGBA1C 6.4 (A) 01/24/2018    HTN; well-controlled; taking ACE-I and thiazide; checks at pharmacy and usually 122/80; not much salt at all  High cholesterol; HDL was low and LDL was a little high;  Not eating red meat; just baked chicken; occasional hot dog; no eggs Not much cheese  Lab Results  Component Value Date   CHOL 144 10/19/2017   HDL 34 (L) 10/19/2017   LDLCALC 94 10/19/2017   TRIG 72 10/19/2017   CHOLHDL 4.2 10/19/2017   CKD stage 2, no NSAIDs; good water drinker  HM list: she does not want flu shot  Depression screen [facility] 2/9 07/26/2018 01/24/2018 10/19/2017 04/19/2017 10/11/2016  Decreased Interest 0 0 0 0 0  Down, Depressed, Hopeless 0 0 0 0 0  PHQ - 2 Score 0 0 0 0 0  Altered sleeping 0 - - - -  Tired, decreased energy 0 - - - -  Change in appetite 0 - - - -  Feeling bad or failure about yourself  0 - - - -  Trouble concentrating 0 - - - -  Moving slowly or fidgety/restless 0 - - - -  Suicidal thoughts 0 - - - -  PHQ-9 Score 0 - - - -  Difficult doing work/chores Not difficult at all - - - -   Fall Risk  07/26/2018 01/24/2018 10/19/2017 04/19/2017 10/11/2016  Falls in the past year? 0 No  Yes No No  Number falls in past yr: 0 - 1 - -  Injury with Fall? 0 - Yes - -    Relevant past medical, surgical, family and social history reviewed Past Medical History:  Diagnosis Date  . Diabetes mellitus without complication ([place])   . Gout   . Hyperlipidemia   . Hypertension   . Obesity    Past Surgical History:  Procedure Laterality Date  . ABDOMINAL HYSTERECTOMY     Family History  Problem Relation Age of Onset  . Kidney disease Mother   . Kidney disease Sister   . Stroke Brother   . Heart disease Sister   . Stroke Sister    Social History   Tobacco Use  . Smoking status: Never Smoker  . Smokeless tobacco: Never Used  Substance Use Topics  . Alcohol use: No    Alcohol/week: 0.0 standard drinks  . Drug use: No     [facility] Visit from 07/26/2018 in [facility]  AUDIT-C Score  0      Interim  medical history since last visit reviewed. Allergies and medications reviewed  Review of Systems Per HPI unless specifically indicated above     Objective:    BP 118/70   Pulse 86   Temp 98.2 F (36.8 C) (Oral)   Resp 12   Ht 5\' 8"  (1.727 m)   Wt 160 lb 9.6 oz (72.8 kg)   SpO2 98%   BMI 24.42 kg/m   Wt Readings from Last 3 Encounters:  07/26/18 160 lb 9.6 oz (72.8 kg)  01/24/18 161 lb 6.4 oz (73.2 kg)  10/19/17 164 lb 8 oz (74.6 kg)    Physical Exam Constitutional:      General: She is not in acute distress.    Appearance: She is well-developed. She is not diaphoretic.  HENT:     Head: Normocephalic and atraumatic.  Eyes:     General: No scleral icterus. Neck:     Thyroid: No thyromegaly.  Cardiovascular:     Rate and Rhythm: Normal rate and regular rhythm.     Heart sounds: Normal heart sounds. No murmur.  Pulmonary:     Effort: Pulmonary effort is normal. No respiratory distress.     Breath sounds: Normal breath sounds. No wheezing.  Abdominal:     General: Bowel sounds are normal. There is no distension.     Palpations:  Abdomen is soft.  Skin:    General: Skin is warm and dry.     Coloration: Skin is not pale.  Neurological:     Mental Status: She is alert.  Psychiatric:        Behavior: Behavior normal.        Thought Content: Thought content normal.        Judgment: Judgment normal.    Diabetic Foot Form - Detailed   Diabetic Foot Exam - detailed Diabetic Foot exam was performed with the following findings:  Yes 07/26/2018 11:03 AM  Visual Foot Exam completed.:  Yes  Pulse Foot Exam completed.:  Yes  Right Dorsalis Pedis:  Present Left Dorsalis Pedis:  Present  Sensory Foot Exam Completed.:  Yes Semmes-Weinstein Monofilament Test R Site 1-Great Toe:  Pos L Site 1-Great Toe:  Pos         Results for orders placed or performed in visit on 01/25/18  HM DIABETES EYE EXAM  Result Value Ref Range   HM Diabetic Eye Exam No Retinopathy No Retinopathy      Assessment & Plan:   Problem List Items Addressed This Visit      Cardiovascular and Mediastinum   Essential hypertension (Chronic)    Continue medicines; avoid salt; well-controlled        Endocrine   Type 2 diabetes mellitus without complication ([place]) - Primary (Chronic)    Foot exam looked good today; eye exam UTD; check urine and A1c; try to limit sugary drinks; will see what labs show first and then consider if replacement for the $$$ januvia is needed      Relevant Orders   Urine Microalbumin w/creat. ratio   Hemoglobin A1C     Genitourinary   CKD (chronic kidney disease) stage 2, GFR 60-89 ml/min (Chronic)    Avoid NSAIDs; good water drinker        Other   Medication monitoring encounter (Chronic)    Check liver and kidneys      Relevant Orders   COMPLETE METABOLIC PANEL WITH GFR   Hyperlipidemia (Chronic)    Check lipids; limit fatty foods  Relevant Orders   Lipid panel    Other Visit Diagnoses    Estrogen deficiency       Relevant Orders   DG Bone Density       Follow up plan: Return in about 6  months (around 01/24/2019) for follow-up visit with Dr. Sanda Klein (or just after).  An after-visit summary was printed and given to the patient at [place] [facility].  Please see the patient instructions which may contain other information and recommendations beyond what is mentioned above in the assessment and plan.  No orders of the defined types were placed in this encounter.   Orders Placed This Encounter  Procedures  . DG Bone Density  . Urine Microalbumin w/creat. ratio  . Hemoglobin A1C  . Lipid panel  . COMPLETE METABOLIC PANEL WITH GFR

## 2018-07-26 NOTE — Assessment & Plan Note (Signed)
Check liver and kidneys 

## 2018-07-26 NOTE — Assessment & Plan Note (Signed)
Avoid NSAIDs; good water drinker

## 2018-07-26 NOTE — Patient Instructions (Addendum)
Try diet ginger ale instead of regular   Please do call to schedule your bone density study; the number to schedule one at either [facility] or [facility] is [phone] or [phone]  I do recommend yearly flu shots; for individuals who don't want flu shots, try to practice excellent hand hygiene, and avoid nursing homes, day cares, and hospitals during peak flu season; taking additional vitamin C daily during flu/cold season may help boost your immune system too

## 2018-07-27 LAB — HEMOGLOBIN A1C
Hgb A1c MFr Bld: 6.6 % of total Hgb — ABNORMAL HIGH (ref <5.7)
Mean Plasma Glucose: 143 (calc)
eAG (mmol/L): 7.9 (calc)

## 2018-07-27 LAB — COMPLETE METABOLIC PANEL WITH GFR
AG Ratio: 1.1 (calc) (ref 1.0–2.5)
ALT: 9 U/L (ref 6–29)
AST: 34 U/L (ref 10–35)
Albumin: 3.9 g/dL (ref 3.6–5.1)
Alkaline phosphatase (APISO): 74 U/L (ref 33–130)
BUN / CREAT RATIO: 16 (calc) (ref 6–22)
BUN: 21 mg/dL (ref 7–25)
CO2: 30 mmol/L (ref 20–32)
Calcium: 9.4 mg/dL (ref 8.6–10.4)
Chloride: 104 mmol/L (ref 98–110)
Creat: 1.34 mg/dL — ABNORMAL HIGH (ref 0.60–0.93)
GFR, Est African American: 44 mL/min/{1.73_m2} — ABNORMAL LOW (ref >=60)
GFR, Est Non African American: 38 mL/min/{1.73_m2} — ABNORMAL LOW (ref >=60)
GLUCOSE: 96 mg/dL (ref 65–99)
Globulin: 3.7 g/dL (calc) (ref 1.9–3.7)
Potassium: 3.9 mmol/L (ref 3.5–5.3)
Sodium: 140 mmol/L (ref 135–146)
Total Bilirubin: 0.4 mg/dL (ref 0.2–1.2)
Total Protein: 7.6 g/dL (ref 6.1–8.1)

## 2018-07-27 LAB — LIPID PANEL
Cholesterol: 181 mg/dL (ref <200)
HDL: 32 mg/dL — ABNORMAL LOW (ref >50)
LDL Cholesterol (Calc): 130 mg/dL (calc) — ABNORMAL HIGH
Non-HDL Cholesterol (Calc): 149 mg/dL (calc) — ABNORMAL HIGH (ref <130)
Total CHOL/HDL Ratio: 5.7 (calc) — ABNORMAL HIGH (ref <5.0)
Triglycerides: 86 mg/dL (ref <150)

## 2018-07-27 LAB — MICROALBUMIN / CREATININE URINE RATIO
Creatinine, Urine: 207 mg/dL (ref 20–275)
Microalb Creat Ratio: 5 mcg/mg creat (ref <30)
Microalb, Ur: 1.1 mg/dL

## 2018-07-29 ENCOUNTER — Other Ambulatory Visit: Payer: Self-pay | Admitting: Family Medicine

## 2018-07-29 ENCOUNTER — Telehealth: Payer: Self-pay

## 2018-07-29 MED ORDER — ROSUVASTATIN CALCIUM 20 MG PO TABS: 20.0000 mg | ORAL_TABLET | Freq: Every day | ORAL | 0 refills | Status: DC

## 2018-07-29 MED ORDER — LOSARTAN POTASSIUM-HCTZ 50-12.5 MG PO TABS: 1.0000 | ORAL_TABLET | Freq: Every day | ORAL | 0 refills | Status: DC

## 2018-07-29 NOTE — Telephone Encounter (Signed)
Please clarify medications with patient and send to PCP if she does in fact need a refill. It appears she was recently seen by PCP and does not appear on list.

## 2018-07-29 NOTE — Telephone Encounter (Signed)
[facility] Chlorathalidone 25mg  (take 1 tablet by mouth daily) but I do not see that on her current medication list.  Please advise

## 2018-07-30 NOTE — Telephone Encounter (Signed)
Dr. Sanda Klein sent her in a new medication yesterday with instructions, so I called this patient and explained everything to her.

## 2018-08-14 ENCOUNTER — Ambulatory Visit: Payer: Self-pay

## 2018-08-19 ENCOUNTER — Ambulatory Visit: Payer: Medicare HMO

## 2018-08-19 VITALS — BP 122/76 | HR 82

## 2018-08-19 DIAGNOSIS — I10 Essential (primary) hypertension: Secondary | ICD-10-CM

## 2018-08-19 LAB — BASIC METABOLIC PANEL WITH GFR
BUN/Creatinine Ratio: 12 (calc) (ref 6–22)
BUN: 13 mg/dL (ref 7–25)
CO2: 32 mmol/L (ref 20–32)
Calcium: 9.5 mg/dL (ref 8.6–10.4)
Chloride: 103 mmol/L (ref 98–110)
Creat: 1.06 mg/dL — ABNORMAL HIGH (ref 0.60–0.93)
GFR, Est African American: 58 mL/min/{1.73_m2} — ABNORMAL LOW (ref >=60)
GFR, Est Non African American: 50 mL/min/{1.73_m2} — ABNORMAL LOW (ref >=60)
Glucose, Bld: 116 mg/dL — ABNORMAL HIGH (ref 65–99)
Potassium: 4 mmol/L (ref 3.5–5.3)
Sodium: 141 mmol/L (ref 135–146)

## 2018-08-19 NOTE — Progress Notes (Signed)
Patient here for blood pressure check since starting new combo losartan/hctz.  Patient denies any side effects.  Blood pressure today is 122/76 pulse 82.  Consulted with Dr. lada and we will continue current regimen.

## 2018-09-04 ENCOUNTER — Telehealth: Payer: Self-pay | Admitting: Family Medicine

## 2018-09-04 NOTE — Telephone Encounter (Signed)
I called the patient to schedule her AWV with Kasey.  She said that she doesn't know when she will be able to come, but she will call back to schedule it. VDM (DD)

## 2018-10-05 ENCOUNTER — Other Ambulatory Visit: Payer: Self-pay | Admitting: Family Medicine

## 2018-10-20 ENCOUNTER — Other Ambulatory Visit: Payer: Self-pay | Admitting: Family Medicine

## 2018-10-21 NOTE — Telephone Encounter (Signed)
Lab Results  Component Value Date   CREATININE 1.06 (H) 08/19/2018   Lab Results  Component Value Date   K 4.0 08/19/2018   Lab Results  Component Value Date   ALT 9 07/26/2018

## 2019-01-15 ENCOUNTER — Other Ambulatory Visit: Payer: Self-pay | Admitting: Family Medicine

## 2019-01-24 ENCOUNTER — Ambulatory Visit: Payer: Medicare HMO | Admitting: Family Medicine

## 2019-01-30 ENCOUNTER — Other Ambulatory Visit: Payer: Self-pay

## 2019-01-30 ENCOUNTER — Ambulatory Visit (INDEPENDENT_AMBULATORY_CARE_PROVIDER_SITE_OTHER): Payer: Medicare HMO | Admitting: Family Medicine

## 2019-01-30 ENCOUNTER — Encounter: Payer: Self-pay | Admitting: Family Medicine

## 2019-01-30 VITALS — BP 118/68 | HR 64 | Temp 96.8°F | Resp 14 | Ht 68.0 in | Wt 174.2 lb

## 2019-01-30 DIAGNOSIS — I10 Essential (primary) hypertension: Secondary | ICD-10-CM

## 2019-01-30 DIAGNOSIS — E782 Mixed hyperlipidemia: Secondary | ICD-10-CM | POA: Diagnosis not present

## 2019-01-30 DIAGNOSIS — N182 Chronic kidney disease, stage 2 (mild): Secondary | ICD-10-CM

## 2019-01-30 DIAGNOSIS — E119 Type 2 diabetes mellitus without complications: Secondary | ICD-10-CM

## 2019-01-30 NOTE — Progress Notes (Signed)
Name: Angela Horton   MRN: [id number]    DOB: 05-21-1941   Date:01/30/2019       Progress Note  Subjective  Chief Complaint  Chief Complaint  Patient presents with  . Follow-up    HPI  Type 2 diabetes: just diet-controlled at this time. No polydipsia, polyphagia, polyuria.  Last eye exam was April 2020 - has follow up in 1 month.  Tries to limit sugary drinks, drinks diet gingerale.   HTN; well-controlled - lower end of normal today but feeling well without SE's - no lightheadedness or dizziness, no orthostatic changes, no BLE edema, chest pain, shortness of breath; taking ACE-I and thiazide and amlodipine.  High cholesterol; HDL was low and LDL was a little high - due for repeat labs.  She is tolerating statin therapy and is comfortable remaining on at this time; no chest pain, shortness of breath, or myalgias.  CKD stage 2, no NSAIDs; good water drinker - drinking about 50-60oz a day.   Patient Active Problem List   Diagnosis Date Noted  . CKD (chronic kidney disease) stage 2, GFR 60-89 ml/min 07/26/2018  . Medication monitoring encounter 03/23/2016  . Premature atrial contractions 10/12/2015  . Type 2 diabetes mellitus without complication ([place]) 41/66/0630  . Essential hypertension 01/07/2015  . Hyperlipidemia 01/07/2015    Past Surgical History:  Procedure Laterality Date  . ABDOMINAL HYSTERECTOMY      Family History  Problem Relation Age of Onset  . Kidney disease Mother   . Kidney disease Sister   . Stroke Brother   . Heart disease Sister   . Stroke Sister     Social History   Socioeconomic History  . Marital status: Married    Spouse name: Not on file  . Number of children: Not on file  . Years of education: Not on file  . Highest education level: Not on file  Occupational History  . Not on file  Social Needs  . Financial resource strain: Not on file  . Food insecurity    Worry: Not on file    Inability: Not on file  . Transportation needs    Medical: Not on file    Non-medical: Not on file  Tobacco Use  . Smoking status: Never Smoker  . Smokeless tobacco: Never Used  Substance and Sexual Activity  . Alcohol use: No    Alcohol/week: 0.0 standard drinks  . Drug use: No  . Sexual activity: Yes  Lifestyle  . Physical activity    Days per week: Not on file    Minutes per session: Not on file  . Stress: Not on file  Relationships  . Social Herbalist on phone: Not on file    Gets together: Not on file    Attends religious service: Not on file    Active member of club or organization: Not on file    Attends meetings of clubs or organizations: Not on file    Relationship status: Not on file  . Intimate partner violence    Fear of current or ex partner: Not on file    Emotionally abused: Not on file    Physically abused: Not on file    Forced sexual activity: Not on file  Other Topics Concern  . Not on file  Social History Narrative  . Not on file     Current Outpatient Medications:  .  amLODipine (NORVASC) 10 MG tablet, TAKE 1 TABLET (10 MG TOTAL) BY MOUTH  DAILY., Disp: 90 tablet, Rfl: 1 .  aspirin EC 81 MG tablet, Take 1 tablet (81 mg total) by mouth daily., Disp: , Rfl:  .  brimonidine (ALPHAGAN) 0.2 % ophthalmic solution, Place 1 drop into both eyes 2 (two) times daily., Disp: , Rfl: 3 .  losartan-hydrochlorothiazide (HYZAAR) 50-12.5 MG tablet, TAKE 1 TABLET BY MOUTH DAILY. STOP LISINOPRIL AND CHLORTHALIDONE, Disp: 90 tablet, Rfl: 0 .  Multiple Vitamin (MULTIVITAMIN) tablet, Take 1 tablet by mouth daily., Disp: , Rfl:  .  Omega-3 Fatty Acids (FISH OIL) 1000 MG CAPS, Take 1 capsule by mouth daily., Disp: , Rfl:  .  rosuvastatin (CRESTOR) 20 MG tablet, TAKE 1 TABLET (20 MG TOTAL) BY MOUTH DAILY. FOR CHOLESTEROL --- THIS REPLACES ATORVASTATIN, Disp: 90 tablet, Rfl: 0 .  timolol (TIMOPTIC) 0.5 % ophthalmic solution, INSTILL 1 DROP INTO BOTH EYES EVERY MORNING, Disp: , Rfl: 3  Allergies  Allergen Reactions   . Metformin And Related     Upset stomach headaches    I personally reviewed active problem list, medication list, allergies, notes from last encounter, lab results with the patient/caregiver today.   ROS  Constitutional: Negative for fever or weight change.  Respiratory: Negative for cough and shortness of breath.   Cardiovascular: Negative for chest pain or palpitations.  Gastrointestinal: Negative for abdominal pain, no bowel changes.  Musculoskeletal: Negative for gait problem or joint swelling.  Skin: Negative for rash.  Neurological: Negative for dizziness or headache.  No other specific complaints in a complete review of systems (except as listed in HPI above).  Objective  Vitals:   01/30/19 1021  BP: 118/68  Pulse: 64  Resp: 14  Temp: (!) 96.8 F (36 C)  TempSrc: Oral  SpO2: 96%  Weight: 174 lb 3.2 oz (79 kg)  Height: 5\' 8"  (1.727 m)    Body mass index is 26.49 kg/m.  Physical Exam  Constitutional: Patient appears well-developed and well-nourished. No distress.  HENT: Head: Normocephalic and atraumatic.  Eyes: Conjunctivae and EOM are normal. No scleral icterus. Neck: Normal range of motion. Neck supple. No JVD present. No thyromegaly present.  Cardiovascular: Normal rate, regular rhythm and normal heart sounds.  No murmur heard. No BLE edema. Pulmonary/Chest: Effort normal and breath sounds normal. No respiratory distress. Musculoskeletal: Normal range of motion, no joint effusions. No gross deformities Neurological: Pt is alert and oriented to person, place, and time. No cranial nerve deficit. Coordination, balance, strength, speech and gait are normal.  Skin: Skin is warm and dry. No rash noted. No erythema.  Psychiatric: Patient has a normal mood and affect. behavior is normal. Judgment and thought content normal.  No results found for this or any previous visit (from the past 72 hour(s)).  PHQ2/9: Depression screen [facility] 2/9 01/30/2019 07/26/2018  01/24/2018 10/19/2017 04/19/2017  Decreased Interest 0 0 0 0 0  Down, Depressed, Hopeless 0 0 0 0 0  PHQ - 2 Score 0 0 0 0 0  Altered sleeping 0 0 - - -  Tired, decreased energy 0 0 - - -  Change in appetite 0 0 - - -  Feeling bad or failure about yourself  0 0 - - -  Trouble concentrating 0 0 - - -  Moving slowly or fidgety/restless 0 0 - - -  Suicidal thoughts 0 0 - - -  PHQ-9 Score 0 0 - - -  Difficult doing work/chores Not difficult at all Not difficult at all - - -   PHQ-2/9 Result is  negative.    Fall Risk: Fall Risk  01/30/2019 07/26/2018 01/24/2018 10/19/2017 04/19/2017  Falls in the past year? 0 0 No Yes No  Number falls in past yr: 0 0 - 1 -  Injury with Fall? 0 0 - Yes -    Functional Status Survey: Is the patient deaf or have difficulty hearing?: No Does the patient have difficulty seeing, even when wearing glasses/contacts?: No Does the patient have difficulty concentrating, remembering, or making decisions?: No Does the patient have difficulty walking or climbing stairs?: No Does the patient have difficulty dressing or bathing?: No Does the patient have difficulty doing errands alone such as visiting a doctor's office or shopping?: No   Assessment & Plan  1. Type 2 diabetes mellitus without complication, without long-term current use of insulin (HCC) - COMPLETE METABOLIC PANEL WITH GFR - Hemoglobin A1c - Doing well on diet controlled regimen only.   2. CKD (chronic kidney disease) stage 2, GFR 60-89 ml/min - COMPLETE METABOLIC PANEL WITH GFR  3. Essential hypertension - COMPLETE METABOLIC PANEL WITH GFR - Continue current regimen  4. Mixed hyperlipidemia - Lipid panel - Continue current regimen.

## 2019-01-31 LAB — COMPLETE METABOLIC PANEL WITH GFR
AG Ratio: 1.2 (calc) (ref 1.0–2.5)
ALT: 12 U/L (ref 6–29)
AST: 36 U/L — ABNORMAL HIGH (ref 10–35)
Albumin: 4 g/dL (ref 3.6–5.1)
Alkaline phosphatase (APISO): 69 U/L (ref 37–153)
BUN/Creatinine Ratio: 15 (calc) (ref 6–22)
BUN: 15 mg/dL (ref 7–25)
CO2: 28 mmol/L (ref 20–32)
Calcium: 9.3 mg/dL (ref 8.6–10.4)
Chloride: 103 mmol/L (ref 98–110)
Creat: 0.99 mg/dL — ABNORMAL HIGH (ref 0.60–0.88)
GFR, Est African American: 62 mL/min/{1.73_m2} (ref >=60)
GFR, Est Non African American: 54 mL/min/{1.73_m2} — ABNORMAL LOW (ref >=60)
Globulin: 3.4 g/dL (calc) (ref 1.9–3.7)
Glucose, Bld: 108 mg/dL — ABNORMAL HIGH (ref 65–99)
Potassium: 3.6 mmol/L (ref 3.5–5.3)
Sodium: 139 mmol/L (ref 135–146)
Total Bilirubin: 0.6 mg/dL (ref 0.2–1.2)
Total Protein: 7.4 g/dL (ref 6.1–8.1)

## 2019-01-31 LAB — LIPID PANEL
Cholesterol: 109 mg/dL (ref <200)
HDL: 33 mg/dL — ABNORMAL LOW (ref >=50)
LDL Cholesterol (Calc): 62 mg/dL (calc)
Non-HDL Cholesterol (Calc): 76 mg/dL (calc) (ref <130)
Total CHOL/HDL Ratio: 3.3 (calc) (ref <5.0)
Triglycerides: 61 mg/dL (ref <150)

## 2019-01-31 LAB — HEMOGLOBIN A1C
Hgb A1c MFr Bld: 7.1 % of total Hgb — ABNORMAL HIGH (ref <5.7)
Mean Plasma Glucose: 157 (calc)
eAG (mmol/L): 8.7 (calc)

## 2019-02-27 DIAGNOSIS — H401131 Primary open-angle glaucoma, bilateral, mild stage: Secondary | ICD-10-CM | POA: Diagnosis not present

## 2019-04-02 ENCOUNTER — Other Ambulatory Visit: Payer: Self-pay | Admitting: Family Medicine

## 2019-04-02 NOTE — Telephone Encounter (Signed)
Requested medication (s) are due for refill today: yes  Requested medication (s) are on the active medication list: yes  Last refill:  01/01/2019  Future visit scheduled: no  Notes to clinic: review for refill   Requested Prescriptions  Pending Prescriptions Disp Refills   amLODipine (NORVASC) 10 MG tablet [Pharmacy Med Name: AMLODIPINE BESYLATE 10 MG TAB] 90 tablet 1    Sig: TAKE 1 TABLET BY MOUTH EVERY DAY     Cardiovascular:  Calcium Channel Blockers Passed - 04/02/2019  9:01 AM      Passed - Last BP in normal range    BP Readings from Last 1 Encounters:  01/30/19 118/68         Passed - Valid encounter within last 6 months    Recent Outpatient Visits          2 months ago Type 2 diabetes mellitus without complication, without long-term current use of insulin [facility])   [facility], Raquel Sarna E, FNP   8 months ago Type 2 diabetes mellitus without complication, without long-term current use of insulin [facility])   [facility], Satira Anis, MD   1 year ago Essential hypertension   [facility], Satira Anis, MD   1 year ago Type 2 diabetes mellitus without complication, without long-term current use of insulin [facility])   [facility], Satira Anis, MD   1 year ago Type 2 diabetes mellitus without complication, without long-term current use of insulin [facility])   [facility], Satira Anis, MD

## 2019-04-03 NOTE — Telephone Encounter (Signed)
LVM informing patient that the amlodipine rx has been [facility] in [facility].

## 2019-04-14 ENCOUNTER — Other Ambulatory Visit: Payer: Self-pay

## 2019-04-14 MED ORDER — ROSUVASTATIN CALCIUM 20 MG PO TABS: 20.0000 mg | ORAL_TABLET | Freq: Every day | ORAL | 1 refills | Status: DC

## 2019-04-14 MED ORDER — LOSARTAN POTASSIUM-HCTZ 50-12.5 MG PO TABS: 1.0000 | ORAL_TABLET | Freq: Every day | ORAL | 1 refills | Status: DC

## 2019-09-26 ENCOUNTER — Other Ambulatory Visit: Payer: Self-pay | Admitting: Family Medicine

## 2019-09-26 MED ORDER — LOSARTAN POTASSIUM-HCTZ 50-12.5 MG PO TABS: 1.0000 | ORAL_TABLET | Freq: Every day | ORAL | 0 refills | Status: DC

## 2019-09-26 MED ORDER — AMLODIPINE BESYLATE 10 MG PO TABS: 10.0000 mg | ORAL_TABLET | Freq: Every day | ORAL | 0 refills | Status: DC

## 2019-09-26 NOTE — Telephone Encounter (Signed)
Refilled BP meds, 90 days without refill. Needs F/u and lab checks, not seen since last July.

## 2019-09-26 NOTE — Telephone Encounter (Signed)
Medication: amLODipine (NORVASC) 10 MG tablet [id number] , losartan-hydrochlorothiazide (HYZAAR) 50-12.5 MG tablet [id number]   Has the patient contacted their pharmacy? Yes  (Agent: If no, request that the patient contact the pharmacy for the refill.) (Agent: If yes, when and what did the pharmacy advise?)  Preferred Pharmacy (with phone number or street name): [id number] - [place], [place] [address]  Phone:  [phone] Fax:  [phone]     Agent: Please be advised that RX refills may take up to 3 business days. We ask that you follow-up with your pharmacy.

## 2019-09-26 NOTE — Telephone Encounter (Signed)
Scripts are pend for refills

## 2019-09-26 NOTE — Telephone Encounter (Signed)
Hypertension medication request:  Last office visit pertaining to hypertension:01/30/19  BP Readings from Last 3 Encounters:  01/30/19 118/68  08/19/18 122/76  07/26/18 118/70    Lab Results  Component Value Date   CREATININE 0.99 (H) 01/30/2019   BUN 15 01/30/2019   NA 139 01/30/2019   K 3.6 01/30/2019   CL 103 01/30/2019   CO2 28 01/30/2019     No follow-ups on file.

## 2019-12-18 ENCOUNTER — Other Ambulatory Visit: Payer: Self-pay

## 2019-12-18 MED ORDER — LOSARTAN POTASSIUM-HCTZ 50-12.5 MG PO TABS: 1.0000 | ORAL_TABLET | Freq: Every day | ORAL | 0 refills | Status: DC

## 2019-12-23 ENCOUNTER — Other Ambulatory Visit: Payer: Self-pay | Admitting: Internal Medicine

## 2019-12-23 NOTE — Telephone Encounter (Signed)
Pt. Has an appointment in July.

## 2019-12-23 NOTE — Telephone Encounter (Signed)
[id number] - [place] [place] - [address]  [address] [place] [address]  Phone: [phone] Fax: [phone]   Pt has one pill left

## 2020-01-08 ENCOUNTER — Ambulatory Visit (INDEPENDENT_AMBULATORY_CARE_PROVIDER_SITE_OTHER): Payer: Medicare HMO

## 2020-01-08 DIAGNOSIS — Z0000 Encounter for general adult medical examination without abnormal findings: Secondary | ICD-10-CM

## 2020-01-08 NOTE — Patient Instructions (Signed)
Angela Horton , Thank you for taking time to come for your Medicare Wellness Visit. I appreciate your ongoing commitment to your health goals. Please review the following plan we discussed and let me know if I can assist you in the future.   Screening recommendations/referrals: Colonoscopy: no longer required  Mammogram: no longer required Bone Density: no longer required Recommended yearly ophthalmology/optometry visit for glaucoma screening and checkup Recommended yearly dental visit for hygiene and checkup  Vaccinations: Influenza vaccine: postponed Pneumococcal vaccine: done 04/29/14 Tdap vaccine: done 06/13/10 Shingles vaccine: Shingrix discussed. Please contact your pharmacy for coverage information.  Covid-19:done 10/01/19 & 10/22/19  Advanced directives: Please bring a copy of your health care power of attorney and living will to the office at your convenience.  Conditions/risks identified: Recommend increasing physical activity to 3 days per week  Next appointment: Follow up in one year for your annual wellness visit    Preventive Care 65 Years and Older, Female Preventive care refers to lifestyle choices and visits with your health care provider that can promote health and wellness. What does preventive care include?  A yearly physical exam. This is also called an annual well check.  Dental exams once or twice a year.  Routine eye exams. Ask your health care provider how often you should have your eyes checked.  Personal lifestyle choices, including:  Daily care of your teeth and gums.  Regular physical activity.  Eating a healthy diet.  Avoiding tobacco and drug use.  Limiting alcohol use.  Practicing safe sex.  Taking low-dose aspirin every day.  Taking vitamin and mineral supplements as recommended by your health care provider. What happens during an annual well check? The services and screenings done by your health care provider during your annual well  check will depend on your age, overall health, lifestyle risk factors, and family history of disease. Counseling  Your health care provider may ask you questions about your:  Alcohol use.  Tobacco use.  Drug use.  Emotional well-being.  Home and relationship well-being.  Sexual activity.  Eating habits.  History of falls.  Memory and ability to understand (cognition).  Work and work Statistician.  Reproductive health. Screening  You may have the following tests or measurements:  Height, weight, and BMI.  Blood pressure.  Lipid and cholesterol levels. These may be checked every 5 years, or more frequently if you are over 40 years old.  Skin check.  Lung cancer screening. You may have this screening every year starting at age 77 if you have a 30-pack-year history of smoking and currently smoke or have quit within the past 15 years.  Fecal occult blood test (FOBT) of the stool. You may have this test every year starting at age 59.  Flexible sigmoidoscopy or colonoscopy. You may have a sigmoidoscopy every 5 years or a colonoscopy every 10 years starting at age 30.  Hepatitis C blood test.  Hepatitis B blood test.  Sexually transmitted disease (STD) testing.  Diabetes screening. This is done by checking your blood sugar (glucose) after you have not eaten for a while (fasting). You may have this done every 1-3 years.  Bone density scan. This is done to screen for osteoporosis. You may have this done starting at age 48.  Mammogram. This may be done every 1-2 years. Talk to your health care provider about how often you should have regular mammograms. Talk with your health care provider about your test results, treatment options, and if necessary, the need for  more tests. Vaccines  Your health care provider may recommend certain vaccines, such as:  Influenza vaccine. This is recommended every year.  Tetanus, diphtheria, and acellular pertussis (Tdap, Td) vaccine. You  may need a Td booster every 10 years.  Zoster vaccine. You may need this after age 33.  Pneumococcal 13-valent conjugate (PCV13) vaccine. One dose is recommended after age 45.  Pneumococcal polysaccharide (PPSV23) vaccine. One dose is recommended after age 20. Talk to your health care provider about which screenings and vaccines you need and how often you need them. This information is not intended to replace advice given to you by your health care provider. Make sure you discuss any questions you have with your health care provider. Document Released: 07/16/2015 Document Revised: 03/08/2016 Document Reviewed: 04/20/2015 Elsevier Interactive Patient Education  2017 [employer] Prevention in the Home Falls can cause injuries. They can happen to people of all ages. There are many things you can do to make your home safe and to help prevent falls. What can I do on the outside of my home?  Regularly fix the edges of walkways and driveways and fix any cracks.  Remove anything that might make you trip as you walk through a door, such as a raised step or threshold.  Trim any bushes or trees on the path to your home.  Use bright outdoor lighting.  Clear any walking paths of anything that might make someone trip, such as rocks or tools.  Regularly check to see if handrails are loose or broken. Make sure that both sides of any steps have handrails.  Any raised decks and porches should have guardrails on the edges.  Have any leaves, snow, or ice cleared regularly.  Use sand or salt on walking paths during winter.  Clean up any spills in your garage right away. This includes oil or grease spills. What can I do in the bathroom?  Use night lights.  Install grab bars by the toilet and in the tub and shower. Do not use towel bars as grab bars.  Use non-skid mats or decals in the tub or shower.  If you need to sit down in the shower, use a plastic, non-slip stool.  Keep the floor  dry. Clean up any water that spills on the floor as soon as it happens.  Remove soap buildup in the tub or shower regularly.  Attach bath mats securely with double-sided non-slip rug tape.  Do not have throw rugs and other things on the floor that can make you trip. What can I do in the bedroom?  Use night lights.  Make sure that you have a light by your bed that is easy to reach.  Do not use any sheets or blankets that are too big for your bed. They should not hang down onto the floor.  Have a firm chair that has side arms. You can use this for support while you get dressed.  Do not have throw rugs and other things on the floor that can make you trip. What can I do in the kitchen?  Clean up any spills right away.  Avoid walking on wet floors.  Keep items that you use a lot in easy-to-reach places.  If you need to reach something above you, use a strong step stool that has a grab bar.  Keep electrical cords out of the way.  Do not use floor polish or wax that makes floors slippery. If you must use wax, use non-skid  floor wax.  Do not have throw rugs and other things on the floor that can make you trip. What can I do with my stairs?  Do not leave any items on the stairs.  Make sure that there are handrails on both sides of the stairs and use them. Fix handrails that are broken or loose. Make sure that handrails are as long as the stairways.  Check any carpeting to make sure that it is firmly attached to the stairs. Fix any carpet that is loose or worn.  Avoid having throw rugs at the top or bottom of the stairs. If you do have throw rugs, attach them to the floor with carpet tape.  Make sure that you have a light switch at the top of the stairs and the bottom of the stairs. If you do not have them, ask someone to add them for you. What else can I do to help prevent falls?  Wear shoes that:  Do not have high heels.  Have rubber bottoms.  Are comfortable and fit you  well.  Are closed at the toe. Do not wear sandals.  If you use a stepladder:  Make sure that it is fully opened. Do not climb a closed stepladder.  Make sure that both sides of the stepladder are locked into place.  Ask someone to hold it for you, if possible.  Clearly mark and make sure that you can see:  Any grab bars or handrails.  First and last steps.  Where the edge of each step is.  Use tools that help you move around (mobility aids) if they are needed. These include:  Canes.  Walkers.  Scooters.  Crutches.  Turn on the lights when you go into a dark area. Replace any light bulbs as soon as they burn out.  Set up your furniture so you have a clear path. Avoid moving your furniture around.  If any of your floors are uneven, fix them.  If there are any pets around you, be aware of where they are.  Review your medicines with your doctor. Some medicines can make you feel dizzy. This can increase your chance of falling. Ask your doctor what other things that you can do to help prevent falls. This information is not intended to replace advice given to you by your health care provider. Make sure you discuss any questions you have with your health care provider. Document Released: 04/15/2009 Document Revised: 11/25/2015 Document Reviewed: 07/24/2014 Elsevier Interactive Patient Education  2017 [employer].

## 2020-01-08 NOTE — Progress Notes (Signed)
Subjective:   Angela Horton is a 79 y.o. female who presents for an Initial Medicare Annual Wellness Visit.  Virtual Visit via Telephone Note  I connected with  Angela Horton on 01/08/20 at  2:50 PM EDT by telephone and verified that I am speaking with the correct person using two identifiers.  Medicare Annual Wellness visit completed telephonically due to Covid-19 pandemic.   Location: Patient: home Provider: office   I discussed the limitations, risks, security and privacy concerns of performing an evaluation and management service by telephone and the availability of in person appointments. The patient expressed understanding and agreed to proceed.  Unable to perform video visit due to video visit attempted and failed and/or patient does not have video capability.   Some vital signs may be absent or patient reported.   Clemetine Marker, LPN    Review of Systems     Cardiac Risk Factors include: advanced age (>20men, >59 women);diabetes mellitus;dyslipidemia;hypertension     Objective:    There were no vitals filed for this visit. There is no height or weight on file to calculate BMI.  Advanced Directives 04/19/2017 10/11/2016 03/23/2016 10/12/2015 05/20/2015 01/07/2015  Does Patient Have a Medical Advance Directive? No Yes Yes Yes No Yes  Type of Advance Directive - - Living will [place];Living will - [place];Living will  Does patient want to make changes to medical advance directive? - - - No - Patient declined - No - Patient declined  Copy of [place] in Chart? - - - No - copy requested - Yes    Current Medications (verified) Outpatient Encounter Medications as of 01/08/2020  Medication Sig  . amLODipine (NORVASC) 10 MG tablet TAKE 1 TABLET BY MOUTH EVERY DAY  . brimonidine (ALPHAGAN) 0.2 % ophthalmic solution Place 1 drop into both eyes 2 (two) times daily.  Marland Kitchen losartan-hydrochlorothiazide (HYZAAR) 50-12.5  MG tablet Take 1 tablet by mouth daily.  . Omega-3 Fatty Acids (FISH OIL) 1000 MG CAPS Take 1 capsule by mouth daily.  . rosuvastatin (CRESTOR) 20 MG tablet Take 1 tablet (20 mg total) by mouth daily. For cholesterol --- this replaces atorvastatin  . timolol (TIMOPTIC) 0.5 % ophthalmic solution INSTILL 1 DROP INTO BOTH EYES EVERY MORNING  . [DISCONTINUED] aspirin EC 81 MG tablet Take 1 tablet (81 mg total) by mouth daily.  . [DISCONTINUED] Multiple Vitamin (MULTIVITAMIN) tablet Take 1 tablet by mouth daily.   No facility-administered encounter medications on file as of 01/08/2020.    Allergies (verified) Metformin and related   History: Past Medical History:  Diagnosis Date  . Diabetes mellitus without complication ([place])   . Gout   . Hyperlipidemia   . Hypertension   . Obesity    Past Surgical History:  Procedure Laterality Date  . ABDOMINAL HYSTERECTOMY     Family History  Problem Relation Age of Onset  . Kidney disease Mother   . Kidney disease Sister   . Stroke Brother   . Heart disease Sister   . Stroke Sister    Social History   Socioeconomic History  . Marital status: Married    Spouse name: Not on file  . Number of children: 4  . Years of education: Not on file  . Highest education level: Not on file  Occupational History  . Not on file  Tobacco Use  . Smoking status: Never Smoker  . Smokeless tobacco: Never Used  Vaping Use  . Vaping Use:  Never used  Substance and Sexual Activity  . Alcohol use: No    Alcohol/week: 0.0 standard drinks  . Drug use: No  . Sexual activity: Yes  Other Topics Concern  . Not on file  Social History Narrative  . Not on file   Social Determinants of Health   Financial Resource Strain: Low Risk   . Difficulty of Paying Living Expenses: Not hard at all  Food Insecurity: No Food Insecurity  . Worried About Charity fundraiser in the Last Year: Never true  . Ran Out of Food in the Last Year: Never true  Transportation  Needs: No Transportation Needs  . Lack of Transportation (Medical): No  . Lack of Transportation (Non-Medical): No  Physical Activity: Inactive  . Days of Exercise per Week: 0 days  . Minutes of Exercise per Session: 0 min  Stress:   . Feeling of Stress :   Social Connections: Moderately Integrated  . Frequency of Communication with Friends and Family: More than three times a week  . Frequency of Social Gatherings with Friends and Family: More than three times a week  . Attends Religious Services: More than 4 times per year  . Active Member of Clubs or Organizations: No  . Attends Archivist Meetings: Never  . Marital Status: Married    Tobacco Counseling Counseling given: Not Answered   Clinical Intake:  Pre-visit preparation completed: Yes  Pain : No/denies pain     Nutritional Risks: None Diabetes: Yes CBG done?: No Did pt. bring in CBG monitor from home?: No  How often do you need to have someone help you when you read instructions, pamphlets, or other written materials from your doctor or pharmacy?: 1 - Never  Nutrition Risk Assessment:  Has the patient had any N/V/D within the last 2 months?  No  Does the patient have any non-healing wounds?  No  Has the patient had any unintentional weight loss or weight gain?  No   Diabetes:  Is the patient diabetic?  Yes  If diabetic, was a CBG obtained today?  No  Did the patient bring in their glucometer from home?  No  How often do you monitor your CBG's? approx twice a month.   Financial Strains and Diabetes Management:  Are you having any financial strains with the device, your supplies or your medication? No .  Does the patient want to be seen by Chronic Care Management for management of their diabetes?  No  Would the patient like to be referred to a Nutritionist or for Diabetic Management?  No   Diabetic Exams:  Diabetic Eye Exam: Completed 01/25/18. Overdue for diabetic eye exam. Pt has been advised  about the importance in completing this exam.  Diabetic Foot Exam: Completed 07/26/18. Pt has been advised about the importance in completing this exam. Pt is scheduled for diabetic foot exam on 01/22/20.    Interpreter Needed?: No  Information entered by :: Clemetine Marker LPN   Activities of Daily Living In your present state of health, do you have any difficulty performing the following activities: 01/08/2020 01/30/2019  Hearing? N N  Comment declines hearing aids -  Vision? N N  Difficulty concentrating or making decisions? N N  Walking or climbing stairs? N N  Dressing or bathing? N N  Doing errands, shopping? N N  Preparing Food and eating ? N -  Using the Toilet? N -  In the past six months, have you accidently leaked urine?  N -  Do you have problems with loss of bowel control? N -  Managing your Medications? N -  Managing your Finances? N -  Housekeeping or managing your Housekeeping? N -  Some recent data might be hidden    Patient Care Team: Delsa Grana, PA-C as PCP - General (Family Medicine)  Indicate any recent Medical Services you may have received from other than [facility] providers in the past year (date may be approximate).     Assessment:   This is a routine wellness examination for [facility].  Hearing/Vision screen  Hearing Screening   125Hz  250Hz  500Hz  1000Hz  2000Hz  3000Hz  4000Hz  6000Hz  8000Hz   Right ear:           Left ear:           Comments: Pt denies hearing difficulty  Vision Screening Comments: Past due for [facility]   Dietary issues and exercise activities discussed: Current Exercise Habits: The patient does not participate in regular exercise at present, Exercise limited by: None identified  Goals    . Increase physical activity     Recommend increasing physical activity to at least 3 days per week      Depression Screen PHQ 2/9 Scores 01/08/2020 01/30/2019 07/26/2018 01/24/2018 10/19/2017 04/19/2017 10/11/2016  PHQ - 2 Score 0 0 0 0  0 0 0  PHQ- 9 Score - 0 0 - - - -    Fall Risk Fall Risk  01/08/2020 01/30/2019 07/26/2018 01/24/2018 10/19/2017  Falls in the past year? 1 0 0 No Yes  Number falls in past yr: 0 0 0 - 1  Injury with Fall? 0 0 0 - Yes  Risk for fall due to : No Fall Risks - - - -  Follow up Falls prevention discussed - - - -    Any stairs in or around the home? Yes  If so, are there any without handrails? Yes  Home free of loose throw rugs in walkways, pet beds, electrical cords, etc? Yes  Adequate lighting in your home to reduce risk of falls? Yes   ASSISTIVE DEVICES UTILIZED TO PREVENT FALLS:  Life alert? No  Use of a cane, walker or w/c? No  Grab bars in the bathroom? No  Shower chair or bench in shower? No  Elevated toilet seat or a handicapped toilet? No   TIMED UP AND GO:  Was the test performed? No . Telephonic visit.   Cognitive Function: pt declined 6CIT for 2021 AWV; states no memory issues        Immunizations Immunization History  Administered Date(s) Administered  . PFIZER SARS-COV-2 Vaccination 10/01/2019, 10/22/2019  . Pneumococcal Conjugate-13 04/29/2014  . Pneumococcal Polysaccharide-23 06/13/2010  . Td 06/13/2010    TDAP status: Up to date   Flu Vaccine status: Declined, Education has been provided regarding the importance of this vaccine but patient still declined. Advised may receive this vaccine at local pharmacy or Health Dept. Aware to provide a copy of the vaccination record if obtained from local pharmacy or Health Dept. Verbalized acceptance and understanding.   Pneumococcal vaccine status: Up to date   Covid-19 vaccine status: Completed vaccines  Qualifies for Shingles Vaccine? Yes   Zostavax completed No   Shingrix Completed?: No.    Education has been provided regarding the importance of this vaccine. Patient has been advised to call insurance company to determine out of pocket expense if they have not yet received this vaccine. Advised may also receive  vaccine at local pharmacy or Health Dept. Verbalized acceptance and understanding.  Screening Tests Health Maintenance  Topic Date Due  . OPHTHALMOLOGY EXAM  10/23/2018  . FOOT EXAM  07/27/2019  . HEMOGLOBIN A1C  08/02/2019  . INFLUENZA VACCINE  02/01/2020  . TETANUS/TDAP  06/13/2020  . DEXA SCAN  Completed  . COVID-19 Vaccine  Completed  . PNA vac Low Risk Adult  Completed    Health Maintenance  Health Maintenance Due  Topic Date Due  . OPHTHALMOLOGY EXAM  10/23/2018  . FOOT EXAM  07/27/2019  . HEMOGLOBIN A1C  08/02/2019    Colorectal cancer screening: No longer required.    Mammogram status: No longer required.    Bone Density status: Completed 12/16/12. Results reflect: Bone density results: NORMAL. Repeat every 2 years. pt declines repeat screening.   Lung Cancer Screening: (Low Dose CT Chest recommended if Age 46-80 years, 30 pack-year currently smoking OR have quit w/in 15years.) does not qualify.   Additional Screening:  Hepatitis C Screening: no longer required  Vision Screening: Recommended annual ophthalmology exams for early detection of glaucoma and other disorders of the eye. Is the patient up to date with their annual eye exam?  No  Who is the provider or what is the name of the office in which the patient attends annual eye exams? [place] Screening: Recommended annual dental exams for proper oral hygiene  Community Resource Referral / Chronic Care Management: CRR required this visit?  No   CCM required this visit?  No      Plan:     I have personally reviewed and noted the following in the patient's chart:   . Medical and social history . Use of alcohol, tobacco or illicit drugs  . Current medications and supplements . Functional ability and status . Nutritional status . Physical activity . Advanced directives . List of other physicians . Hospitalizations, surgeries, and ER visits in previous 12  months . Vitals . Screenings to include cognitive, depression, and falls . Referrals and appointments  In addition, I have reviewed and discussed with patient certain preventive protocols, quality metrics, and best practice recommendations. A written personalized care plan for preventive services as well as general preventive health recommendations were provided to patient.     Clemetine Marker, LPN   2/0/0379   Nurse Notes: none

## 2020-01-22 ENCOUNTER — Other Ambulatory Visit: Payer: Self-pay

## 2020-01-22 ENCOUNTER — Ambulatory Visit (INDEPENDENT_AMBULATORY_CARE_PROVIDER_SITE_OTHER): Payer: Medicare HMO | Admitting: Family Medicine

## 2020-01-22 ENCOUNTER — Encounter: Payer: Self-pay | Admitting: Family Medicine

## 2020-01-22 VITALS — BP 124/82 | HR 96 | Temp 97.8°F | Resp 16 | Ht 68.0 in | Wt 171.1 lb

## 2020-01-22 DIAGNOSIS — N182 Chronic kidney disease, stage 2 (mild): Secondary | ICD-10-CM | POA: Diagnosis not present

## 2020-01-22 DIAGNOSIS — E782 Mixed hyperlipidemia: Secondary | ICD-10-CM | POA: Diagnosis not present

## 2020-01-22 DIAGNOSIS — E119 Type 2 diabetes mellitus without complications: Secondary | ICD-10-CM

## 2020-01-22 DIAGNOSIS — I10 Essential (primary) hypertension: Secondary | ICD-10-CM | POA: Diagnosis not present

## 2020-01-22 DIAGNOSIS — Z5181 Encounter for therapeutic drug level monitoring: Secondary | ICD-10-CM

## 2020-01-22 MED ORDER — ROSUVASTATIN CALCIUM 20 MG PO TABS: 20.0000 mg | ORAL_TABLET | Freq: Every day | ORAL | 3 refills | Status: DC

## 2020-01-22 MED ORDER — LOSARTAN POTASSIUM-HCTZ 50-12.5 MG PO TABS: 1.0000 | ORAL_TABLET | Freq: Every day | ORAL | 3 refills | Status: DC

## 2020-01-22 MED ORDER — AMLODIPINE BESYLATE 10 MG PO TABS: 10.0000 mg | ORAL_TABLET | Freq: Every day | ORAL | 3 refills | Status: DC

## 2020-01-22 NOTE — Patient Instructions (Signed)
Diabetes Mellitus and Standards of Medical Care Managing diabetes (diabetes mellitus) can be complicated. Your diabetes treatment may be managed by a team of health care providers, including:  A physician who specializes in diabetes (endocrinologist).  A nurse practitioner or physician assistant.  Nurses.  A diet and nutrition specialist (registered dietitian).  A certified diabetes educator (CDE).  An exercise specialist.  A pharmacist.  An eye doctor.  A foot specialist (podiatrist).  A dentist.  A primary care provider.  A mental health provider. Your health care providers follow guidelines to help you get the best quality of care. The following schedule is a general guideline for your diabetes management plan. Your health care providers may give you more specific instructions. Physical exams Upon being diagnosed with diabetes mellitus, and each year after that, your health care provider will ask about your medical and family history. He or she will also do a physical exam. Your exam may include:  Measuring your height, weight, and body mass index (BMI).  Checking your blood pressure. This will be done at every routine medical visit. Your target blood pressure may vary depending on your medical conditions, your age, and other factors.  Thyroid gland exam.  Skin exam.  Screening for damage to your nerves (peripheral neuropathy). This may include checking the pulse in your legs and feet and checking the level of sensation in your hands and feet.  A complete foot exam to inspect the structure and skin of your feet, including checking for cuts, bruises, redness, blisters, sores, or other problems.  Screening for blood vessel (vascular) problems, which may include checking the pulse in your legs and feet and checking your temperature. Blood tests Depending on your treatment plan and your personal needs, you may have the following tests done:  HbA1c (hemoglobin A1c). This  test provides information about blood sugar (glucose) control over the previous 2-3 months. It is used to adjust your treatment plan, if needed. This test will be done: ? At least 2 times a year, if you are meeting your treatment goals. ? 4 times a year, if you are not meeting your treatment goals or if treatment goals have changed.  Lipid testing, including total, LDL, and HDL cholesterol and triglyceride levels. ? The goal for LDL is less than 100 mg/dL (5.5 mmol/L). If you are at high risk for complications, the goal is less than 70 mg/dL (3.9 mmol/L). ? The goal for HDL is 40 mg/dL (2.2 mmol/L) or higher for men and 50 mg/dL (2.8 mmol/L) or higher for women. An HDL cholesterol of 60 mg/dL (3.3 mmol/L) or higher gives some protection against heart disease. ? The goal for triglycerides is less than 150 mg/dL (8.3 mmol/L).  Liver function tests.  Kidney function tests.  Thyroid function tests. Dental and eye exams  Visit your dentist two times a year.  If you have type 1 diabetes, your health care provider may recommend an eye exam 3-5 years after you are diagnosed, and then once a year after your first exam. ? For children with type 1 diabetes, a health care provider may recommend an eye exam when your child is age 10 or older and has had diabetes for 3-5 years. After the first exam, your child should get an eye exam once a year.  If you have type 2 diabetes, your health care provider may recommend an eye exam as soon as you are diagnosed, and then once a year after your first exam. Immunizations   The   yearly flu (influenza) vaccine is recommended for everyone 6 months or older who has diabetes.  The pneumonia (pneumococcal) vaccine is recommended for everyone 2 years or older who has diabetes. If you are 65 or older, you may get the pneumonia vaccine as a series of two separate shots.  The hepatitis B vaccine is recommended for adults shortly after being diagnosed with  diabetes.  Adults and children with diabetes should receive all other vaccines according to age-specific recommendations from the Centers for Disease Control and Prevention (CDC). Mental and emotional health Screening for symptoms of eating disorders, anxiety, and depression is recommended at the time of diagnosis and afterward as needed. If your screening shows that you have symptoms (positive screening result), you may need more evaluation and you may work with a mental health care provider. Treatment plan Your treatment plan will be reviewed at every medical visit. You and your health care provider will discuss:  How you are taking your medicines, including insulin.  Any side effects you are experiencing.  Your blood glucose target goals.  The frequency of your blood glucose monitoring.  Lifestyle habits, such as activity level as well as tobacco, alcohol, and substance use. Diabetes self-management education Your health care provider will assess how well you are monitoring your blood glucose levels and whether you are taking your insulin correctly. He or she may refer you to:  A certified diabetes educator to manage your diabetes throughout your life, starting at diagnosis.  A registered dietitian who can create or review your personal nutrition plan.  An exercise specialist who can discuss your activity level and exercise plan. Summary  Managing diabetes (diabetes mellitus) can be complicated. Your diabetes treatment may be managed by a team of health care providers.  Your health care providers follow guidelines in order to help you get the best quality of care.  Standards of care including having regular physical exams, blood tests, blood pressure monitoring, immunizations, screening tests, and education about how to manage your diabetes.  Your health care providers may also give you more specific instructions based on your individual health. This information is not intended  to replace advice given to you by your health care provider. Make sure you discuss any questions you have with your health care provider. Document Revised: 03/08/2018 Document Reviewed: 03/17/2016 Elsevier Patient Education  2020 [employer]  

## 2020-01-22 NOTE — Progress Notes (Signed)
Name: Angela Horton   MRN: [id number]    DOB: [birth year]   Date:01/22/2020       Progress Note  Chief Complaint  Patient presents with  . Follow-up  . Hypertension  . Hyperlipidemia     Subjective:   Angela Horton is a 79 y.o. female, presents to clinic for   HLD - crestor 20 mg, she is taking daily, tolerating Denies CP, SOB, claudication sx, myalgias  DM:    Currently managing with diet/lifestyle Denies: Polyuria, polydipsia, vision changes, neuropathy, hypoglycemia Recent pertinent labs: Lab Results  Component Value Date   HGBA1C 7.1 (H) 01/30/2019   HGBA1C 6.6 (H) 07/26/2018   HGBA1C 6.4 (A) 01/24/2018    She isn't eating as much, lost a few lbs- reviewed weights, only -3 lbs in the past year  Wt Readings from Last 5 Encounters:  01/22/20 171 lb 1.6 oz (77.6 kg)  01/30/19 174 lb 3.2 oz (79 kg)  07/26/18 160 lb 9.6 oz (72.8 kg)  01/24/18 161 lb 6.4 oz (73.2 kg)  10/19/17 164 lb 8 oz (74.6 kg)   BMI Readings from Last 5 Encounters:  01/22/20 26.02 kg/m  01/30/19 26.49 kg/m  07/26/18 24.42 kg/m  01/24/18 24.54 kg/m  10/19/17 25.01 kg/m   Hypertension:  Currently managed on losartan HCTZ amlodipine She checks BP occasionally at home she recalls readings -  136/69 Pt reports good med compliance and denies any SE.  No lightheadedness, hypotension, syncope. Blood pressure today is well controlled. BP Readings from Last 3 Encounters:  01/22/20 124/82  01/30/19 118/68  08/19/18 122/76   Pt denies CP, SOB, exertional sx, LE edema, palpitation, Ha's, visual disturbance    Current Outpatient Medications:  .  amLODipine (NORVASC) 10 MG tablet, TAKE 1 TABLET BY MOUTH EVERY DAY, Disp: 90 tablet, Rfl: 0 .  brimonidine (ALPHAGAN) 0.2 % ophthalmic solution, Place 1 drop into both eyes 2 (two) times daily., Disp: , Rfl: 3 .  losartan-hydrochlorothiazide (HYZAAR) 50-12.5 MG tablet, Take 1 tablet by mouth daily., Disp: 90 tablet, Rfl: 0 .  Omega-3 Fatty  Acids (FISH OIL) 1000 MG CAPS, Take 1 capsule by mouth daily., Disp: , Rfl:  .  rosuvastatin (CRESTOR) 20 MG tablet, Take 1 tablet (20 mg total) by mouth daily. For cholesterol --- this replaces atorvastatin, Disp: 90 tablet, Rfl: 1 .  timolol (TIMOPTIC) 0.5 % ophthalmic solution, INSTILL 1 DROP INTO BOTH EYES EVERY MORNING, Disp: , Rfl: 3  Patient Active Problem List   Diagnosis Date Noted  . CKD (chronic kidney disease) stage 2, GFR 60-89 ml/min 07/26/2018  . Premature atrial contractions 10/12/2015  . Type 2 diabetes mellitus without complication ([place]) 50/03/3817  . Essential hypertension 01/07/2015  . Hyperlipidemia 01/07/2015    Past Surgical History:  Procedure Laterality Date  . ABDOMINAL HYSTERECTOMY      Family History  Problem Relation Age of Onset  . Kidney disease Mother   . Kidney disease Sister   . Stroke Brother   . Heart disease Sister   . Stroke Sister     Social History   Tobacco Use  . Smoking status: Never Smoker  . Smokeless tobacco: Never Used  Vaping Use  . Vaping Use: Never used  Substance Use Topics  . Alcohol use: No    Alcohol/week: 0.0 standard drinks  . Drug use: No     Allergies  Allergen Reactions  . Metformin And Related     Upset stomach headaches    Health Maintenance  Topic Date Due  . OPHTHALMOLOGY EXAM  10/23/2018  . FOOT EXAM  07/27/2019  . HEMOGLOBIN A1C  08/02/2019  . INFLUENZA VACCINE  02/01/2020  . TETANUS/TDAP  06/13/2020  . DEXA SCAN  Completed  . COVID-19 Vaccine  Completed  . PNA vac Low Risk Adult  Completed    Chart Review Today: I personally reviewed active problem list, medication list, allergies, family history, social history, health maintenance, notes from last encounter, lab results, imaging with the patient/caregiver today.   Review of Systems  10 Systems reviewed and are negative for acute change except as noted in the HPI.  Objective:   Vitals:   01/22/20 0953  BP: 124/82  Pulse: 96  Resp:  16  Temp: 97.8 F (36.6 C)  TempSrc: Temporal  SpO2: 96%  Weight: 171 lb 1.6 oz (77.6 kg)  Height: 5\' 8"  (1.727 m)    Body mass index is 26.02 kg/m.  Physical Exam Vitals and nursing note reviewed.  Constitutional:      General: She is not in acute distress.    Appearance: Normal appearance. She is well-developed. She is not ill-appearing, toxic-appearing or diaphoretic.     Interventions: Face mask in place.  HENT:     Head: Normocephalic and atraumatic.     Right Ear: External ear normal.     Left Ear: External ear normal.  Eyes:     General: Lids are normal. No scleral icterus.       Right eye: No discharge.        Left eye: No discharge.     Conjunctiva/sclera: Conjunctivae normal.  Neck:     Trachea: Phonation normal. No tracheal deviation.  Cardiovascular:     Rate and Rhythm: Normal rate and regular rhythm.     Pulses: Normal pulses.          Radial pulses are 2+ on the right side and 2+ on the left side.       Posterior tibial pulses are 2+ on the right side and 2+ on the left side.     Heart sounds: Normal heart sounds. No murmur heard.  No friction rub. No gallop.   Pulmonary:     Effort: Pulmonary effort is normal. No respiratory distress.     Breath sounds: Normal breath sounds. No stridor. No wheezing, rhonchi or rales.  Chest:     Chest wall: No tenderness.  Abdominal:     General: Bowel sounds are normal. There is no distension.     Palpations: Abdomen is soft.  Musculoskeletal:        General: No deformity.     Cervical back: Normal range of motion and neck supple.     Right lower leg: No edema.     Left lower leg: No edema.  Lymphadenopathy:     Cervical: No cervical adenopathy.  Skin:    General: Skin is warm and dry.     Coloration: Skin is not jaundiced or pale.     Findings: No rash.  Neurological:     Mental Status: She is alert. Mental status is at baseline.     Motor: No abnormal muscle tone.     Gait: Gait normal.  Psychiatric:         Mood and Affect: Mood normal.        Speech: Speech normal.        Behavior: Behavior normal.      Diabetic Foot Exam - Simple   Simple Foot Form Diabetic  Foot exam was performed with the following findings: Yes 01/22/2020 10:30 AM  Visual Inspection Sensation Testing Pulse Check Comments       Assessment & Plan:     ICD-10-CM   1. Essential hypertension  U02 COMPLETE METABOLIC PANEL WITH GFR    amLODipine (NORVASC) 10 MG tablet    losartan-hydrochlorothiazide (HYZAAR) 50-12.5 MG tablet   stable, well controlled BP on current meds, recheck labs, meds refilled  2. Mixed hyperlipidemia  E78.2 Lipid panel    rosuvastatin (CRESTOR) 20 MG tablet   compliant with statin, no SE or concerns, denies myalgias, discussed possibly d/c meds due to age, she will continue statins for now, labs due  3. Type 2 diabetes mellitus without complication, without long-term current use of insulin (HCC)  E11.9 Lipid panel    Hemoglobin A1C    COMPLETE METABOLIC PANEL WITH GFR    Microalbumin, urine   has been well controlled with diet/lifestyle, recheck labs, no concerning findings or sx today  4. CKD (chronic kidney disease) stage 2, GFR 60-89 ml/min  B34.3 COMPLETE METABOLIC PANEL WITH GFR   monitoring, discussed monitoring and management with pt, control bp, avoid nephrotoxic meds, careful with nsaids, stay hydrated etc.  5. Medication monitoring encounter  Z51.81 Lipid panel    Hemoglobin A1C    COMPLETE METABOLIC PANEL WITH GFR    Microalbumin, urine    CBC with Differential/Platelet     Return in about 6 months (around 07/24/2020) for Routine follow-up.   Delsa Grana, PA-C 01/22/20 10:10 AM

## 2020-01-23 LAB — CBC WITH DIFFERENTIAL/PLATELET
Absolute Monocytes: 479 cells/uL (ref 200–950)
Basophils Absolute: 21 cells/uL (ref 0–200)
Basophils Relative: 0.5 %
Eosinophils Absolute: 59 cells/uL (ref 15–500)
Eosinophils Relative: 1.4 %
HCT: 34.1 % — ABNORMAL LOW (ref 35.0–45.0)
Hemoglobin: 11 g/dL — ABNORMAL LOW (ref 11.7–15.5)
Lymphs Abs: 1949 cells/uL (ref 850–3900)
MCH: 27.4 pg (ref 27.0–33.0)
MCHC: 32.3 g/dL (ref 32.0–36.0)
MCV: 84.8 fL (ref 80.0–100.0)
MPV: 11.6 fL (ref 7.5–12.5)
Monocytes Relative: 11.4 %
Neutro Abs: 1693 cells/uL (ref 1500–7800)
Neutrophils Relative %: 40.3 %
Platelets: 266 10*3/uL (ref 140–400)
RBC: 4.02 10*6/uL (ref 3.80–5.10)
RDW: 13.1 % (ref 11.0–15.0)
Total Lymphocyte: 46.4 %
WBC: 4.2 10*3/uL (ref 3.8–10.8)

## 2020-01-23 LAB — COMPLETE METABOLIC PANEL WITH GFR
AG Ratio: 1.1 (calc) (ref 1.0–2.5)
ALT: 10 U/L (ref 6–29)
AST: 36 U/L — ABNORMAL HIGH (ref 10–35)
Albumin: 4.2 g/dL (ref 3.6–5.1)
Alkaline phosphatase (APISO): 82 U/L (ref 37–153)
BUN/Creatinine Ratio: 12 (calc) (ref 6–22)
BUN: 12 mg/dL (ref 7–25)
CO2: 27 mmol/L (ref 20–32)
Calcium: 9.6 mg/dL (ref 8.6–10.4)
Chloride: 103 mmol/L (ref 98–110)
Creat: 1.03 mg/dL — ABNORMAL HIGH (ref 0.60–0.88)
GFR, Est African American: 59 mL/min/{1.73_m2} — ABNORMAL LOW (ref >=60)
GFR, Est Non African American: 51 mL/min/{1.73_m2} — ABNORMAL LOW (ref >=60)
Globulin: 3.9 g/dL (calc) — ABNORMAL HIGH (ref 1.9–3.7)
Glucose, Bld: 118 mg/dL — ABNORMAL HIGH (ref 65–99)
Potassium: 4.1 mmol/L (ref 3.5–5.3)
Sodium: 140 mmol/L (ref 135–146)
Total Bilirubin: 0.5 mg/dL (ref 0.2–1.2)
Total Protein: 8.1 g/dL (ref 6.1–8.1)

## 2020-01-23 LAB — MICROALBUMIN, URINE: Microalb, Ur: 0.9 mg/dL

## 2020-01-23 LAB — HEMOGLOBIN A1C
Hgb A1c MFr Bld: 7 % of total Hgb — ABNORMAL HIGH (ref <5.7)
Mean Plasma Glucose: 154 (calc)
eAG (mmol/L): 8.5 (calc)

## 2020-01-23 LAB — LIPID PANEL
Cholesterol: 121 mg/dL (ref <200)
HDL: 36 mg/dL — ABNORMAL LOW (ref >=50)
LDL Cholesterol (Calc): 70 mg/dL (calc)
Non-HDL Cholesterol (Calc): 85 mg/dL (calc) (ref <130)
Total CHOL/HDL Ratio: 3.4 (calc) (ref <5.0)
Triglycerides: 71 mg/dL (ref <150)

## 2020-02-16 ENCOUNTER — Encounter: Payer: Self-pay | Admitting: Family Medicine

## 2020-02-26 ENCOUNTER — Telehealth: Payer: Self-pay | Admitting: *Deleted

## 2020-02-26 NOTE — Chronic Care Management (AMB) (Signed)
  Chronic Care Management   Outreach Note  02/26/2020 Name: JARRETT CHICOINE MRN: [id number] DOB: 27-Mar-1941  SHAWNEE GAMBONE is a 79 y.o. year old female who is a primary care patient of Delsa Grana, [place]. I reached out to [name] by phone today in response to a referral sent by Ms. [employer] health plan.     An unsuccessful telephone outreach was attempted today. The patient was referred to the case management team for assistance with care management and care coordination.   Follow Up Plan: The care management team will reach out to the patient again over the next 7 days. If patient returns call to provider office, please advise to call Lindy at [phone].  [facility]

## 2020-03-04 NOTE — Chronic Care Management (AMB) (Signed)
  Chronic Care Management   Note  03/04/2020 Name: [name] MRN: [id number] DOB: 12-30-1940  Angela Horton is a 79 y.o. year old female who is a primary care patient of Delsa Grana, [place]. I reached out to [name] by phone today in response to a referral sent by [name] health plan.     [name] was given information about Chronic Care Management services today including:  1. CCM service includes personalized support from designated clinical staff supervised by her physician, including individualized plan of care and coordination with other care providers 2. 24/7 contact phone numbers for assistance for urgent and routine care needs. 3. Service will only be billed when office clinical staff spend 20 minutes or more in a month to coordinate care. 4. Only one practitioner may furnish and bill the service in a calendar month. 5. The patient may stop CCM services at any time (effective at the end of the month) by phone call to the office staff. 6. The patient will be responsible for cost sharing (co-pay) of up to 20% of the service fee (after annual deductible is met).  Patient agreed to services and verbal consent obtained.   Follow up plan: Telephone appointment with care management team member scheduled for: 03/17/2020  [facility]

## 2020-03-17 ENCOUNTER — Telehealth: Payer: Medicare HMO

## 2020-03-17 NOTE — Chronic Care Management (AMB) (Deleted)
Chronic Care Management Pharmacy  Name: Angela Horton  MRN: [id number] DOB: 02-14-1941  Chief Complaint/ HPI  Angela Horton,  79 y.o. , female presents for their Initial CCM visit with the clinical pharmacist via telephone due to COVID-19 Pandemic.  PCP : Angela Grana, PA-C  Their chronic conditions include: ***  Office Visits: 7/22 HTN, [place]ia, BP 124/82 P 96 Wt 171 BMI 26.0, A1c 7.1, d/c HLD meds due to age?  Consult Visit:***  Medications: Outpatient Encounter Medications as of 03/17/2020  Medication Sig  . amLODipine (NORVASC) 10 MG tablet Take 1 tablet (10 mg total) by mouth daily.  . brimonidine (ALPHAGAN) 0.2 % ophthalmic solution Place 1 drop into both eyes 2 (two) times daily.  Marland Kitchen losartan-hydrochlorothiazide (HYZAAR) 50-12.5 MG tablet Take 1 tablet by mouth daily.  . Omega-3 Fatty Acids (FISH OIL) 1000 MG CAPS Take 1 capsule by mouth daily.  . rosuvastatin (CRESTOR) 20 MG tablet Take 1 tablet (20 mg total) by mouth at bedtime.  . timolol (TIMOPTIC) 0.5 % ophthalmic solution INSTILL 1 DROP INTO BOTH EYES EVERY MORNING   No facility-administered encounter medications on file as of 03/17/2020.     Current Diagnosis/Assessment:  Goals Addressed   None    Hypertension   BP goal is:  <140/90  Office blood pressures are  BP Readings from Last 3 Encounters:  01/22/20 124/82  01/30/19 118/68  08/19/18 122/76   Patient checks BP at home {CHL HP BP Monitoring Frequency:[phone]} Patient home BP readings are ranging: ***  Patient has failed these meds in the past: *** Patient is currently {CHL Controlled/Uncontrolled:9127973595} on the following medications:  . ***  We discussed {CHL HP Upstream Pharmacy discussion:[phone]}  Plan  Continue {CHL HP Upstream Pharmacy [phone]}     Diabetes   Recent Relevant Labs: Lab Results  Component Value Date/Time   HGBA1C 7.0 (H) 01/22/2020 10:36 AM   HGBA1C 7.1 (H) 01/30/2019 10:47 AM   MICROALBUR  0.9 01/22/2020 10:36 AM   MICROALBUR 1.1 07/26/2018 11:13 AM   MICROALBUR 20 01/07/2015 09:57 AM     Checking BG: {CHL HP Blood Glucose Monitoring Frequency:[phone]}  Recent FBG Readings: Recent pre-meal BG readings: *** Recent 2hr PP BG readings:  *** Recent HS BG readings: *** Patient has failed these meds in past: *** Patient is currently {CHL Controlled/Uncontrolled:9127973595} on the following medications: ***  Last diabetic Foot exam:  Lab Results  Component Value Date/Time   HMDIABEYEEXA No Retinopathy 10/22/2017 12:00 AM    Last diabetic Eye exam: No results found for: HMDIABFOOTEX   We discussed: {CHL HP Upstream Pharmacy discussion:[phone]}  Plan  Continue {CHL HP Upstream Pharmacy [phone]}   Hyperlipidemia   LDL goal < ***  Lipid Panel     Component Value Date/Time   CHOL 121 01/22/2020 1036   CHOL 198 01/07/2015 1117   TRIG 71 01/22/2020 1036   HDL 36 (L) 01/22/2020 1036   HDL 39 (L) 01/07/2015 1117   LDLCALC 70 01/22/2020 1036    Hepatic Function Latest Ref Rng & Units 01/22/2020 01/30/2019 07/26/2018  Total Protein 6.1 - 8.1 g/dL 8.1 7.4 7.6  Albumin 3.6 - 5.1 g/dL - - -  AST 10 - 35 U/L 36(H) 36(H) 34  ALT 6 - 29 U/L $Remo'10 12 9  'qDqBi$ Alk Phosphatase 33 - 130 U/L - - -  Total Bilirubin 0.2 - 1.2 mg/dL 0.5 0.6 0.4  Bilirubin, Direct 0.0 - 0.2 mg/dL - - -     The ASCVD Risk  score Mikey Bussing DC Jr., et al., 2013) failed to calculate for the following reasons:   The 2013 ASCVD risk score is only valid for ages 57 to 27   Patient has failed these meds in past: *** Patient is currently {CHL Controlled/Uncontrolled:5403940695} on the following medications:  . ***  We discussed:  {CHL HP Upstream Pharmacy discussion:[phone]}  Plan  Continue {CHL HP Upstream Pharmacy Plans:[phone]}   Medication Management   [facility] medications Uses pill box? {Yes or If no, why not?:20788} Pt endorses ***% compliance  We discussed:  ***  Plan  {US Pharmacy YBOF:75102}    Follow up: *** month phone visit  ***

## 2020-04-08 ENCOUNTER — Telehealth: Payer: Medicare HMO

## 2020-04-08 NOTE — Chronic Care Management (AMB) (Incomplete)
Chronic Care Management Pharmacy  Name: Angela Horton  MRN: [id number] DOB: Jan 07, 1941  Chief Complaint/ HPI  Angela Horton,  79 y.o. , female presents for their Initial CCM visit with the clinical pharmacist via telephone due to COVID-19 Pandemic.  PCP : Delsa Grana, PA-C  Their chronic conditions include: {CHL AMB CHRONIC MEDICAL CONDITIONS:[phone]}  Office Visits: 7/22 HTN, Tapia, BP 124/82 P 96 Wt 171 BMI 26.0, d/c statin due to age?  Consult Visit:***  Medications: Outpatient Encounter Medications as of 04/08/2020  Medication Sig  . amLODipine (NORVASC) 10 MG tablet Take 1 tablet (10 mg total) by mouth daily.  . brimonidine (ALPHAGAN) 0.2 % ophthalmic solution Place 1 drop into both eyes 2 (two) times daily.  Marland Kitchen losartan-hydrochlorothiazide (HYZAAR) 50-12.5 MG tablet Take 1 tablet by mouth daily.  . Omega-3 Fatty Acids (FISH OIL) 1000 MG CAPS Take 1 capsule by mouth daily.  . rosuvastatin (CRESTOR) 20 MG tablet Take 1 tablet (20 mg total) by mouth at bedtime.  . timolol (TIMOPTIC) 0.5 % ophthalmic solution INSTILL 1 DROP INTO BOTH EYES EVERY MORNING   No facility-administered encounter medications on file as of 04/08/2020.     Current Diagnosis/Assessment:  Goals Addressed   None    Diabetes   Recent Relevant Labs: Lab Results  Component Value Date/Time   HGBA1C 7.0 (H) 01/22/2020 10:36 AM   HGBA1C 7.1 (H) 01/30/2019 10:47 AM   MICROALBUR 0.9 01/22/2020 10:36 AM   MICROALBUR 1.1 07/26/2018 11:13 AM   MICROALBUR 20 01/07/2015 09:57 AM     Checking BG: {CHL HP Blood Glucose Monitoring Frequency:[phone]}  Recent FBG Readings: Recent pre-meal BG readings: *** Recent 2hr PP BG readings:  *** Recent HS BG readings: *** Patient has failed these meds in past: *** Patient is currently {CHL Controlled/Uncontrolled:[phone]} on the following medications: ***  Last diabetic Foot exam:  Lab Results  Component Value Date/Time   HMDIABEYEEXA No  Retinopathy 10/22/2017 12:00 AM    Last diabetic Eye exam: No results found for: HMDIABFOOTEX   We discussed: {CHL HP Upstream Pharmacy discussion:[phone]}  Plan  Continue {CHL HP Upstream Pharmacy [phone]} Hyperlipidemia   LDL goal < ***  Lipid Panel     Component Value Date/Time   CHOL 121 01/22/2020 1036   CHOL 198 01/07/2015 1117   TRIG 71 01/22/2020 1036   HDL 36 (L) 01/22/2020 1036   HDL 39 (L) 01/07/2015 1117   LDLCALC 70 01/22/2020 1036    Hepatic Function Latest Ref Rng & Units 01/22/2020 01/30/2019 07/26/2018  Total Protein 6.1 - 8.1 g/dL 8.1 7.4 7.6  Albumin 3.6 - 5.1 g/dL - - -  AST 10 - 35 U/L 36(H) 36(H) 34  ALT 6 - 29 U/L $Remo'10 12 9  'EyjOS$ Alk Phosphatase 33 - 130 U/L - - -  Total Bilirubin 0.2 - 1.2 mg/dL 0.5 0.6 0.4  Bilirubin, Direct 0.0 - 0.2 mg/dL - - -     The ASCVD Risk score Mikey Bussing DC Jr., et al., 2013) failed to calculate for the following reasons:   The 2013 ASCVD risk score is only valid for ages 96 to 109   Patient has failed these meds in past: *** Patient is currently {CHL Controlled/Uncontrolled:[phone]} on the following medications:  . ***  We discussed:  {CHL HP Upstream Pharmacy discussion:[phone]}  Plan  Continue {CHL HP Upstream Pharmacy [phone]}   Medication Management   [facility] Uses pill box? {Yes or If no, why not?:20788} Pt  endorses ***% compliance  We discussed: {Pharmacy options:24294}  Plan  {US Pharmacy CLEX:51700}    Follow up: *** month phone visit  ***

## 2020-04-27 ENCOUNTER — Telehealth: Payer: Self-pay | Admitting: Pharmacist

## 2020-04-27 NOTE — Progress Notes (Signed)
    Chronic Care Management Pharmacy Assistant   Name: Angela Horton  MRN: [id number] DOB: 04/15/1941  Reason for Encounter: Medication Review  Patient Questions:  1.  Have you seen any other providers since your last visit? No  2.  Any changes in your medicines or health? No   Angela Horton,  79 y.o. , female presents for their Follow-Up CCM visit with the clinical pharmacist via telephone.  PCP : Delsa Grana, PA-C  Allergies:   Allergies  Allergen Reactions  . Metformin And Related     Upset stomach headaches    Medications: Outpatient Encounter Medications as of 04/27/2020  Medication Sig  . amLODipine (NORVASC) 10 MG tablet Take 1 tablet (10 mg total) by mouth daily.  . brimonidine (ALPHAGAN) 0.2 % ophthalmic solution Place 1 drop into both eyes 2 (two) times daily.  Marland Kitchen losartan-hydrochlorothiazide (HYZAAR) 50-12.5 MG tablet Take 1 tablet by mouth daily.  . Omega-3 Fatty Acids (FISH OIL) 1000 MG CAPS Take 1 capsule by mouth daily.  . rosuvastatin (CRESTOR) 20 MG tablet Take 1 tablet (20 mg total) by mouth at bedtime.  . timolol (TIMOPTIC) 0.5 % ophthalmic solution INSTILL 1 DROP INTO BOTH EYES EVERY MORNING   No facility-administered encounter medications on file as of 04/27/2020.    Current Diagnosis: Patient Active Problem List   Diagnosis Date Noted  . CKD (chronic kidney disease) stage 2, GFR 60-89 ml/min 07/26/2018  . Premature atrial contractions 10/12/2015  . Type 2 diabetes mellitus without complication ([place]) 49/44/9675  . Essential hypertension 01/07/2015  . Hyperlipidemia 01/07/2015    Goals Addressed   None     Follow-Up:  Coordination of Enhanced Pharmacy Services   Reviewed chart and adherence measures. Per insurance data no adherence data available.

## 2020-07-27 ENCOUNTER — Other Ambulatory Visit: Payer: Self-pay

## 2020-07-27 ENCOUNTER — Encounter: Payer: Self-pay | Admitting: Family Medicine

## 2020-07-27 ENCOUNTER — Ambulatory Visit (INDEPENDENT_AMBULATORY_CARE_PROVIDER_SITE_OTHER): Payer: Medicare HMO | Admitting: Family Medicine

## 2020-07-27 VITALS — BP 130/82 | Temp 98.4°F | Ht 68.0 in | Wt 167.2 lb

## 2020-07-27 DIAGNOSIS — N182 Chronic kidney disease, stage 2 (mild): Secondary | ICD-10-CM

## 2020-07-27 DIAGNOSIS — I10 Essential (primary) hypertension: Secondary | ICD-10-CM | POA: Diagnosis not present

## 2020-07-27 DIAGNOSIS — Z5181 Encounter for therapeutic drug level monitoring: Secondary | ICD-10-CM | POA: Diagnosis not present

## 2020-07-27 DIAGNOSIS — E782 Mixed hyperlipidemia: Secondary | ICD-10-CM | POA: Diagnosis not present

## 2020-07-27 DIAGNOSIS — E119 Type 2 diabetes mellitus without complications: Secondary | ICD-10-CM | POA: Diagnosis not present

## 2020-07-27 NOTE — Progress Notes (Signed)
Name: Angela Horton   MRN: [id number]    DOB: 1941/04/16   Date:07/27/2020       Progress Note  Chief Complaint  Patient presents with  . Follow-up  . Hypertension  . Hyperlipidemia  . Diabetes     Subjective:   Angela Horton is a 80 y.o. female, presents to clinic for   Hypertension:  Currently managed on Amlodopine, Losartan/HCTZ Pt reports good med compliance and denies any SE.   Blood pressure today is well controlled. BP Readings from Last 3 Encounters:  01/22/20 124/82  01/30/19 118/68  08/19/18 122/76   Pt denies CP, SOB, exertional sx, LE edema, palpitation, Ha's, visual disturbances, lightheadedness, hypotension, syncope.   Hyperlipidemia: Crestor Currently treated with Crestor 20 mg, pt reports good med compliance Last Lipids: Lab Results  Component Value Date   CHOL 121 01/22/2020   HDL 36 (L) 01/22/2020   LDLCALC 70 01/22/2020   TRIG 71 01/22/2020   CHOLHDL 3.4 01/22/2020   - Denies: Chest pain, shortness of breath, myalgias, claudication  DM:   Pt managing DM with No meds Blood sugars not monitoring Denies: Polyuria, polydipsia, vision changes, neuropathy, hypoglycemia Recent pertinent labs: Lab Results  Component Value Date   HGBA1C 7.0 (H) 01/22/2020   HGBA1C 7.1 (H) 01/30/2019   HGBA1C 6.6 (H) 07/26/2018   Standard of care and health maintenance: Foot exam:  01/22/20 DM eye exam: Due ACEI/ARB: Yes on losartan Statin:  Crestor   Current Outpatient Medications:  .  amLODipine (NORVASC) 10 MG tablet, Take 1 tablet (10 mg total) by mouth daily., Disp: 90 tablet, Rfl: 3 .  brimonidine (ALPHAGAN) 0.2 % ophthalmic solution, Place 1 drop into both eyes 2 (two) times daily., Disp: , Rfl: 3 .  losartan-hydrochlorothiazide (HYZAAR) 50-12.5 MG tablet, Take 1 tablet by mouth daily., Disp: 90 tablet, Rfl: 3 .  Omega-3 Fatty Acids (FISH OIL) 1000 MG CAPS, Take 1 capsule by mouth daily., Disp: , Rfl:  .  rosuvastatin (CRESTOR) 20 MG tablet, Take 1  tablet (20 mg total) by mouth at bedtime., Disp: 90 tablet, Rfl: 3 .  timolol (TIMOPTIC) 0.5 % ophthalmic solution, INSTILL 1 DROP INTO BOTH EYES EVERY MORNING, Disp: , Rfl: 3  Patient Active Problem List   Diagnosis Date Noted  . CKD (chronic kidney disease) stage 2, GFR 60-89 ml/min 07/26/2018  . Premature atrial contractions 10/12/2015  . Type 2 diabetes mellitus without complication ([place]) [id number]  . Essential hypertension 01/07/2015  . Hyperlipidemia 01/07/2015    Past Surgical History:  Procedure Laterality Date  . ABDOMINAL HYSTERECTOMY      Family History  Problem Relation Age of Onset  . Kidney disease Mother   . Kidney disease Sister   . Stroke Brother   . Heart disease Sister   . Stroke Sister     Social History   Tobacco Use  . Smoking status: Never Smoker  . Smokeless tobacco: Never Used  Vaping Use  . Vaping Use: Never used  Substance Use Topics  . Alcohol use: No    Alcohol/week: 0.0 standard drinks  . Drug use: No     Allergies  Allergen Reactions  . Metformin And Related     Upset stomach headaches    Health Maintenance  Topic Date Due  . OPHTHALMOLOGY EXAM  10/23/2018  . INFLUENZA VACCINE  Never done  . TETANUS/TDAP  06/13/2020  . HEMOGLOBIN A1C  07/24/2020  . FOOT EXAM  01/21/2021  . DEXA SCAN  Completed  .  COVID-19 Vaccine  Completed  . PNA vac Low Risk Adult  Completed    Chart Review Today: I personally reviewed active problem list, medication list, allergies, family history, social history, health maintenance, notes from last encounter, lab results, imaging with the patient/caregiver today.   Review of Systems  10 Systems reviewed and are negative for acute change except as noted in the HPI.  Objective:   Vitals:   07/27/20 1003  BP: 130/82  Temp: 98.4 F (36.9 C)  Weight: 167 lb 3.2 oz (75.8 kg)  Height: 5\' 8"  (1.727 m)    Body mass index is 25.42 kg/m.  Physical Exam Vitals and nursing note reviewed.   Constitutional:      General: She is not in acute distress.    Appearance: Normal appearance. She is well-developed. She is not ill-appearing, toxic-appearing or diaphoretic.     Interventions: Face mask in place.  HENT:     Head: Normocephalic and atraumatic.     Right Ear: External ear normal.     Left Ear: External ear normal.  Eyes:     General: Lids are normal. No scleral icterus.       Right eye: No discharge.        Left eye: No discharge.     Conjunctiva/sclera: Conjunctivae normal.  Neck:     Trachea: Phonation normal. No tracheal deviation.  Cardiovascular:     Rate and Rhythm: Normal rate and regular rhythm.     Pulses: Normal pulses.          Radial pulses are 2+ on the right side and 2+ on the left side.       Posterior tibial pulses are 2+ on the right side and 2+ on the left side.     Heart sounds: Normal heart sounds. No murmur heard. No friction rub. No gallop.   Pulmonary:     Effort: Pulmonary effort is normal. No respiratory distress.     Breath sounds: Normal breath sounds. No stridor. No wheezing, rhonchi or rales.  Chest:     Chest wall: No tenderness.  Abdominal:     General: Bowel sounds are normal. There is no distension.     Palpations: Abdomen is soft.  Musculoskeletal:     Right lower leg: No edema.     Left lower leg: No edema.  Skin:    General: Skin is warm and dry.     Coloration: Skin is not jaundiced or pale.     Findings: No rash.  Neurological:     Mental Status: She is alert.     Motor: No abnormal muscle tone.     Gait: Gait normal.  Psychiatric:        Mood and Affect: Mood normal.        Speech: Speech normal.        Behavior: Behavior normal.         Assessment & Plan:     ICD-10-CM   1. Essential hypertension  H82 COMPLETE METABOLIC PANEL WITH GFR   Stable, well-controlled on current medications, blood pressure at goal today  2. Mixed hyperlipidemia  E78.2 CANCELED: Lipid panel   Lipid panel recently done and at  goal, good Crestor compliance without myalgias side effects or concerns  3. Type 2 diabetes mellitus without complication, without long-term current use of insulin (HCC)  E11.9 Hemoglobin A1C   Due for recheck of A1c, has been well controlled in the past with only diet and lifestyle efforts, due  for DM eye exam  4. CKD (chronic kidney disease) stage 2, GFR 60-89 ml/min  V56.4 COMPLETE METABOLIC PANEL WITH GFR  5. Medication monitoring encounter  P32.95 COMPLETE METABOLIC PANEL WITH GFR    CANCELED: Lipid panel     Return in about 6 months (around 01/24/2021).   Delsa Grana, PA-C 07/27/20 9:51 AM

## 2020-07-28 LAB — COMPLETE METABOLIC PANEL WITH GFR
AG Ratio: 1.1 (calc) (ref 1.0–2.5)
ALT: 12 U/L (ref 6–29)
AST: 34 U/L (ref 10–35)
Albumin: 3.8 g/dL (ref 3.6–5.1)
Alkaline phosphatase (APISO): 75 U/L (ref 37–153)
BUN/Creatinine Ratio: 17 (calc) (ref 6–22)
BUN: 16 mg/dL (ref 7–25)
CO2: 29 mmol/L (ref 20–32)
Calcium: 9.1 mg/dL (ref 8.6–10.4)
Chloride: 103 mmol/L (ref 98–110)
Creat: 0.96 mg/dL — ABNORMAL HIGH (ref 0.60–0.88)
GFR, Est African American: 64 mL/min/{1.73_m2} (ref >=60)
GFR, Est Non African American: 55 mL/min/{1.73_m2} — ABNORMAL LOW (ref >=60)
Globulin: 3.6 g/dL (calc) (ref 1.9–3.7)
Glucose, Bld: 118 mg/dL — ABNORMAL HIGH (ref 65–99)
Potassium: 3.8 mmol/L (ref 3.5–5.3)
Sodium: 140 mmol/L (ref 135–146)
Total Bilirubin: 0.5 mg/dL (ref 0.2–1.2)
Total Protein: 7.4 g/dL (ref 6.1–8.1)

## 2020-07-28 LAB — HEMOGLOBIN A1C
Hgb A1c MFr Bld: 7.3 % of total Hgb — ABNORMAL HIGH (ref <5.7)
Mean Plasma Glucose: 163 mg/dL
eAG (mmol/L): 9 mmol/L

## 2020-07-29 ENCOUNTER — Telehealth: Payer: Self-pay | Admitting: Family Medicine

## 2020-07-29 DIAGNOSIS — E119 Type 2 diabetes mellitus without complications: Secondary | ICD-10-CM

## 2020-07-29 NOTE — Telephone Encounter (Signed)
Patient returned call and was read lab note By Delsa Grana written 07/28/20.  She understands to come back for recheck of A1c in 3-4 months.

## 2020-07-29 NOTE — Telephone Encounter (Signed)
ordered

## 2021-01-11 ENCOUNTER — Other Ambulatory Visit: Payer: Self-pay

## 2021-01-11 ENCOUNTER — Ambulatory Visit (INDEPENDENT_AMBULATORY_CARE_PROVIDER_SITE_OTHER): Payer: Medicare HMO

## 2021-01-11 VITALS — BP 124/72 | HR 71 | Temp 98.4°F | Resp 16 | Ht 68.0 in | Wt 163.4 lb

## 2021-01-11 DIAGNOSIS — Z0000 Encounter for general adult medical examination without abnormal findings: Secondary | ICD-10-CM | POA: Diagnosis not present

## 2021-01-11 NOTE — Progress Notes (Signed)
Subjective:   Angela Horton is a 80 y.o. female who presents for Medicare Annual (Subsequent) preventive examination.  Review of Systems     Cardiac Risk Factors include: advanced age (>34men, >68 women);diabetes mellitus;dyslipidemia;hypertension     Objective:    Today's Vitals   01/11/21 1454  BP: 124/72  Pulse: 71  Resp: 16  Temp: 98.4 F (36.9 C)  TempSrc: Oral  SpO2: 98%  Weight: 163 lb 6.4 oz (74.1 kg)  Height: 5\' 8"  (1.727 m)   Body mass index is 24.84 kg/m.  Advanced Directives 01/11/2021 04/19/2017 10/11/2016 03/23/2016 10/12/2015 05/20/2015 01/07/2015  Does Patient Have a Medical Advance Directive? Yes No Yes Yes Yes No Yes  Type of Paramedic of [place];Living will - - Living will [place];Living will - [place];Living will  Does patient want to make changes to medical advance directive? - - - - No - Patient declined - No - Patient declined  Copy of [place] in Chart? No - copy requested - - - No - copy requested - Yes    Current Medications (verified) Outpatient Encounter Medications as of 01/11/2021  Medication Sig   amLODipine (NORVASC) 10 MG tablet Take 1 tablet (10 mg total) by mouth daily.   brimonidine (ALPHAGAN) 0.2 % ophthalmic solution Place 1 drop into both eyes 2 (two) times daily.   losartan-hydrochlorothiazide (HYZAAR) 50-12.5 MG tablet Take 1 tablet by mouth daily.   Omega-3 Fatty Acids (FISH OIL) 1000 MG CAPS Take 1 capsule by mouth daily.   rosuvastatin (CRESTOR) 20 MG tablet Take 1 tablet (20 mg total) by mouth at bedtime.   timolol (TIMOPTIC) 0.5 % ophthalmic solution INSTILL 1 DROP INTO BOTH EYES EVERY MORNING   No facility-administered encounter medications on file as of 01/11/2021.    Allergies (verified) Metformin and related   History: Past Medical History:  Diagnosis Date   Diabetes mellitus without complication ([place])    Gout    Hyperlipidemia     Hypertension    Obesity    Past Surgical History:  Procedure Laterality Date   ABDOMINAL HYSTERECTOMY     Family History  Problem Relation Age of Onset   Kidney disease Mother    Kidney disease Sister    Stroke Brother    Heart disease Sister    Stroke Sister    Social History   Socioeconomic History   Marital status: Married    Spouse name: Not on file   Number of children: 4   Years of education: Not on file   Highest education level: Not on file  Occupational History   Not on file  Tobacco Use   Smoking status: Never   Smokeless tobacco: Never   Tobacco comments:    Smoking cessation materials not required  Vaping Use   Vaping Use: Never used  Substance and Sexual Activity   Alcohol use: No    Alcohol/week: 0.0 standard drinks   Drug use: No   Sexual activity: Yes  Other Topics Concern   Not on file  Social History Narrative   Not on file   Social Determinants of Health   Financial Resource Strain: Low Risk    Difficulty of Paying Living Expenses: Not hard at all  Food Insecurity: No Food Insecurity   Worried About Charity fundraiser in the Last Year: Never true   Ran Out of Food in the Last Year: Never true  Transportation Needs: No Transportation  Needs   Lack of Transportation (Medical): No   Lack of Transportation (Non-Medical): No  Physical Activity: Inactive   Days of Exercise per Week: 0 days   Minutes of Exercise per Session: 0 min  Stress: No Stress Concern Present   Feeling of Stress : Not at all  Social Connections: Moderately Integrated   Frequency of Communication with Friends and Family: More than three times a week   Frequency of Social Gatherings with Friends and Family: More than three times a week   Attends Religious Services: More than 4 times per year   Active Member of [employer] or Organizations: No   Attends Music therapist: Never   Marital Status: Married    Tobacco Counseling Counseling given: Not  Answered Tobacco comments: Smoking cessation materials not required   Clinical Intake:  Pre-visit preparation completed: Yes  Pain : No/denies pain     BMI - recorded: 24.84 Nutritional Status: BMI of 19-24  Normal Nutritional Risks: None Diabetes: Yes CBG done?: No Did pt. bring in CBG monitor from home?: No  How often do you need to have someone help you when you read instructions, pamphlets, or other written materials from your doctor or pharmacy?: 1 - Never  Nutrition Risk Assessment:  Has the patient had any N/V/D within the last 2 months?  No  Does the patient have any non-healing wounds?  No  Has the patient had any unintentional weight loss or weight gain?  No   Diabetes:  Is the patient diabetic?  Yes  If diabetic, was a CBG obtained today?  No  Did the patient bring in their glucometer from home?  No  How often do you monitor your CBG's? Twice weekly.   Financial Strains and Diabetes Management:  Are you having any financial strains with the device, your supplies or your medication? No .  Does the patient want to be seen by Chronic Care Management for management of their diabetes?  No  Would the patient like to be referred to a Nutritionist or for Diabetic Management?  No   Diabetic Exams:  Diabetic Eye Exam: Overdue for diabetic eye exam. Pt has been advised about the importance in completing this exam.   Diabetic Foot Exam: Completed 01/22/20.  Interpreter Needed?: No  Information entered by :: Clemetine Marker LPN   Activities of Daily Living In your present state of health, do you have any difficulty performing the following activities: 01/11/2021 07/27/2020  Hearing? N N  Comment declines hearing aids -  Vision? N N  Difficulty concentrating or making decisions? N N  Walking or climbing stairs? N N  Dressing or bathing? N N  Doing errands, shopping? N N  Preparing Food and eating ? N -  Using the Toilet? N -  In the past six months, have you  accidently leaked urine? N -  Do you have problems with loss of bowel control? N -  Managing your Medications? N -  Managing your Finances? N -  Housekeeping or managing your Housekeeping? N -  Some recent data might be hidden    Patient Care Team: Delsa Grana, PA-C as PCP - General (Family Medicine)  Indicate any recent Medical Services you may have received from other than [facility] providers in the past year (date may be approximate).     Assessment:   This is a routine wellness examination for [facility].  Hearing/Vision screen Hearing Screening - Comments::  Pt denies hearing difficulty Vision Screening - Comments:: Past  [facility]   Dietary issues and exercise activities discussed: Current Exercise Habits: The patient does not participate in regular exercise at present, Exercise limited by: None identified   Goals Addressed             This Visit's Progress    Increase physical activity   Not on track    Recommend increasing physical activity to at least 3 days per week        Depression Screen PHQ 2/9 Scores 01/11/2021 07/27/2020 01/22/2020 01/08/2020 01/30/2019 07/26/2018 01/24/2018  PHQ - 2 Score 0 0 0 0 0 0 0  PHQ- 9 Score - 0 0 - 0 0 -    Fall Risk Fall Risk  01/11/2021 07/27/2020 01/22/2020 01/08/2020 01/30/2019  Falls in the past year? 0 0 0 1 0  Number falls in past yr: 0 0 0 0 0  Injury with Fall? 0 0 0 0 0  Risk for fall due to : No Fall Risks - - No Fall Risks -  Follow up Falls prevention discussed - Falls evaluation completed Falls prevention discussed -    FALL RISK PREVENTION PERTAINING TO THE HOME:  Any stairs in or around the home? No  If so, are there any without handrails? No  Home free of loose throw rugs in walkways, pet beds, electrical cords, etc? Yes  Adequate lighting in your home to reduce risk of falls? Yes   ASSISTIVE DEVICES UTILIZED TO PREVENT FALLS:  Life alert? No  Use of a cane, walker or w/c? No  Grab bars in  the bathroom? No  Shower chair or bench in shower? No  Elevated toilet seat or a handicapped toilet? No   TIMED UP AND GO:  Was the test performed? Yes .  Length of time to ambulate 10 feet: 5 sec.   Gait steady and fast without use of assistive device  Cognitive Function: Normal cognitive status assessed by direct observation by this Nurse Health Advisor. No abnormalities found.          Immunizations Immunization History  Administered Date(s) Administered   PFIZER(Purple Top)SARS-COV-2 Vaccination 10/01/2019, 10/22/2019, 06/23/2020   Pneumococcal Conjugate-13 04/29/2014   Pneumococcal Polysaccharide-23 06/13/2010   Td 06/13/2010    TDAP status: Due, Education has been provided regarding the importance of this vaccine. Advised may receive this vaccine at local pharmacy or Health Dept. Aware to provide a copy of the vaccination record if obtained from local pharmacy or Health Dept. Verbalized acceptance and understanding.  Flu Vaccine status: Declined, Education has been provided regarding the importance of this vaccine but patient still declined. Advised may receive this vaccine at local pharmacy or Health Dept. Aware to provide a copy of the vaccination record if obtained from local pharmacy or Health Dept. Verbalized acceptance and understanding.  Pneumococcal vaccine status: Up to date  Covid-19 vaccine status: Completed vaccines  Qualifies for Shingles Vaccine? Yes   Zostavax completed No   Shingrix Completed?: No.    Education has been provided regarding the importance of this vaccine. Patient has been advised to call insurance company to determine out of pocket expense if they have not yet received this vaccine. Advised may also receive vaccine at local pharmacy or Health Dept. Verbalized acceptance and understanding.  Screening Tests Health Maintenance  Topic Date Due   Zoster Vaccines- Shingrix (1 of 2) Never done   OPHTHALMOLOGY EXAM  10/23/2018   COVID-19 Vaccine  (4 - Booster for Pfizer series) 10/22/2020  FOOT EXAM  01/21/2021   HEMOGLOBIN A1C  01/24/2021   INFLUENZA VACCINE  01/31/2021   DEXA SCAN  Completed   PNA vac Low Risk Adult  Completed   HPV VACCINES  Aged Out   TETANUS/TDAP  Discontinued    Health Maintenance  Health Maintenance Due  Topic Date Due   Zoster Vaccines- Shingrix (1 of 2) Never done   OPHTHALMOLOGY EXAM  10/23/2018   COVID-19 Vaccine (4 - Booster for Pfizer series) 10/22/2020    Colorectal cancer screening: No longer required.   Mammogram status: No longer required due to age.  Bone Density status: Completed 12/16/12. Results reflect: Bone density results: NORMAL. Repeat every as directed years. No longer required  Lung Cancer Screening: (Low Dose CT Chest recommended if Age 51-80 years, 30 pack-year currently smoking OR have quit w/in 15years.) does not qualify.   Additional Screening:  Hepatitis C Screening: does not qualify.   Vision Screening: Recommended annual ophthalmology exams for early detection of glaucoma and other disorders of the eye. Is the patient up to date with their annual eye exam?  No  Who is the provider or what is the name of the office in which the patient attends annual eye exams? [facility].   Dental Screening: Recommended annual dental exams for proper oral hygiene  Community Resource Referral / Chronic Care Management: CRR required this visit?  No   CCM required this visit?  No      Plan:     I have personally reviewed and noted the following in the patient's chart:   Medical and social history Use of alcohol, tobacco or illicit drugs  Current medications and supplements including opioid prescriptions.  Functional ability and status Nutritional status Physical activity Advanced directives List of other physicians Hospitalizations, surgeries, and ER visits in previous 12 months Vitals Screenings to include cognitive, depression, and falls Referrals and  appointments  In addition, I have reviewed and discussed with patient certain preventive protocols, quality metrics, and best practice recommendations. A written personalized care plan for preventive services as well as general preventive health recommendations were provided to patient.     Clemetine Marker, LPN   03/31/2445   Nurse Notes: none

## 2021-01-11 NOTE — Patient Instructions (Signed)
Angela Horton , Thank you for taking time to come for your Medicare Wellness Visit. I appreciate your ongoing commitment to your health goals. Please review the following plan we discussed and let me know if I can assist you in the future.   Screening recommendations/referrals: Colonoscopy: no longer required Mammogram: no longer required Bone Density: no longer required Recommended yearly ophthalmology/optometry visit for glaucoma screening and checkup Recommended yearly dental visit for hygiene and checkup  Vaccinations: Influenza vaccine: declined Pneumococcal vaccine: done 04/29/14 Tdap vaccine: due Shingles vaccine: Shingrix discussed. Please contact your pharmacy for coverage information.  Covid-19:done 10/01/19, 10/22/19 & 06/23/20  Advanced directives: Please bring a copy of your health care power of attorney and living will to the office at your convenience.   Conditions/risks identified: Recommend increasing physical activity to at least 3 days per week   Next appointment: Follow up in one year for your annual wellness visit    Preventive Care 65 Years and Older, Female Preventive care refers to lifestyle choices and visits with your health care provider that can promote health and wellness. What does preventive care include? A yearly physical exam. This is also called an annual well check. Dental exams once or twice a year. Routine eye exams. Ask your health care provider how often you should have your eyes checked. Personal lifestyle choices, including: Daily care of your teeth and gums. Regular physical activity. Eating a healthy diet. Avoiding tobacco and drug use. Limiting alcohol use. Practicing safe sex. Taking low-dose aspirin every day. Taking vitamin and mineral supplements as recommended by your health care provider. What happens during an annual well check? The services and screenings done by your health care provider during your annual well check will  depend on your age, overall health, lifestyle risk factors, and family history of disease. Counseling  Your health care provider may ask you questions about your: Alcohol use. Tobacco use. Drug use. Emotional well-being. Home and relationship well-being. Sexual activity. Eating habits. History of falls. Memory and ability to understand (cognition). Work and work Statistician. Reproductive health. Screening  You may have the following tests or measurements: Height, weight, and BMI. Blood pressure. Lipid and cholesterol levels. These may be checked every 5 years, or more frequently if you are over 103 years old. Skin check. Lung cancer screening. You may have this screening every year starting at age 37 if you have a 30-pack-year history of smoking and currently smoke or have quit within the past 15 years. Fecal occult blood test (FOBT) of the stool. You may have this test every year starting at age 90. Flexible sigmoidoscopy or colonoscopy. You may have a sigmoidoscopy every 5 years or a colonoscopy every 10 years starting at age 75. Hepatitis C blood test. Hepatitis B blood test. Sexually transmitted disease (STD) testing. Diabetes screening. This is done by checking your blood sugar (glucose) after you have not eaten for a while (fasting). You may have this done every 1-3 years. Bone density scan. This is done to screen for osteoporosis. You may have this done starting at age 1. Mammogram. This may be done every 1-2 years. Talk to your health care provider about how often you should have regular mammograms. Talk with your health care provider about your test results, treatment options, and if necessary, the need for more tests. Vaccines  Your health care provider may recommend certain vaccines, such as: Influenza vaccine. This is recommended every year. Tetanus, diphtheria, and acellular pertussis (Tdap, Td) vaccine. You may need a  Td booster every 10 years. Zoster vaccine. You may  need this after age 42. Pneumococcal 13-valent conjugate (PCV13) vaccine. One dose is recommended after age 68. Pneumococcal polysaccharide (PPSV23) vaccine. One dose is recommended after age 53. Talk to your health care provider about which screenings and vaccines you need and how often you need them. This information is not intended to replace advice given to you by your health care provider. Make sure you discuss any questions you have with your health care provider. Document Released: 07/16/2015 Document Revised: 03/08/2016 Document Reviewed: 04/20/2015 Elsevier Interactive Patient Education  2017 [place] Prevention in the Home Falls can cause injuries. They can happen to people of all ages. There are many things you can do to make your home safe and to help prevent falls. What can I do on the outside of my home? Regularly fix the edges of walkways and driveways and fix any cracks. Remove anything that might make you trip as you walk through a door, such as a raised step or threshold. Trim any bushes or trees on the path to your home. Use bright outdoor lighting. Clear any walking paths of anything that might make someone trip, such as rocks or tools. Regularly check to see if handrails are loose or broken. Make sure that both sides of any steps have handrails. Any raised decks and porches should have guardrails on the edges. Have any leaves, snow, or ice cleared regularly. Use sand or salt on walking paths during winter. Clean up any spills in your garage right away. This includes oil or grease spills. What can I do in the bathroom? Use night lights. Install grab bars by the toilet and in the tub and shower. Do not use towel bars as grab bars. Use non-skid mats or decals in the tub or shower. If you need to sit down in the shower, use a plastic, non-slip stool. Keep the floor dry. Clean up any water that spills on the floor as soon as it happens. Remove soap buildup in  the tub or shower regularly. Attach bath mats securely with double-sided non-slip rug tape. Do not have throw rugs and other things on the floor that can make you trip. What can I do in the bedroom? Use night lights. Make sure that you have a light by your bed that is easy to reach. Do not use any sheets or blankets that are too big for your bed. They should not hang down onto the floor. Have a firm chair that has side arms. You can use this for support while you get dressed. Do not have throw rugs and other things on the floor that can make you trip. What can I do in the kitchen? Clean up any spills right away. Avoid walking on wet floors. Keep items that you use a lot in easy-to-reach places. If you need to reach something above you, use a strong step stool that has a grab bar. Keep electrical cords out of the way. Do not use floor polish or wax that makes floors slippery. If you must use wax, use non-skid floor wax. Do not have throw rugs and other things on the floor that can make you trip. What can I do with my stairs? Do not leave any items on the stairs. Make sure that there are handrails on both sides of the stairs and use them. Fix handrails that are broken or loose. Make sure that handrails are as long as the stairways. Check any  carpeting to make sure that it is firmly attached to the stairs. Fix any carpet that is loose or worn. Avoid having throw rugs at the top or bottom of the stairs. If you do have throw rugs, attach them to the floor with carpet tape. Make sure that you have a light switch at the top of the stairs and the bottom of the stairs. If you do not have them, ask someone to add them for you. What else can I do to help prevent falls? Wear shoes that: Do not have high heels. Have rubber bottoms. Are comfortable and fit you well. Are closed at the toe. Do not wear sandals. If you use a stepladder: Make sure that it is fully opened. Do not climb a closed  stepladder. Make sure that both sides of the stepladder are locked into place. Ask someone to hold it for you, if possible. Clearly mark and make sure that you can see: Any grab bars or handrails. First and last steps. Where the edge of each step is. Use tools that help you move around (mobility aids) if they are needed. These include: Canes. Walkers. Scooters. Crutches. Turn on the lights when you go into a dark area. Replace any light bulbs as soon as they burn out. Set up your furniture so you have a clear path. Avoid moving your furniture around. If any of your floors are uneven, fix them. If there are any pets around you, be aware of where they are. Review your medicines with your doctor. Some medicines can make you feel dizzy. This can increase your chance of falling. Ask your doctor what other things that you can do to help prevent falls. This information is not intended to replace advice given to you by your health care provider. Make sure you discuss any questions you have with your health care provider. Document Released: 04/15/2009 Document Revised: 11/25/2015 Document Reviewed: 07/24/2014 Elsevier Interactive Patient Education  2017 [employer].

## 2021-01-24 ENCOUNTER — Ambulatory Visit: Payer: Medicare HMO | Admitting: Family Medicine

## 2021-01-25 ENCOUNTER — Encounter: Payer: Self-pay | Admitting: Family Medicine

## 2021-01-25 ENCOUNTER — Ambulatory Visit (INDEPENDENT_AMBULATORY_CARE_PROVIDER_SITE_OTHER): Payer: Medicare HMO | Admitting: Family Medicine

## 2021-01-25 ENCOUNTER — Other Ambulatory Visit: Payer: Self-pay

## 2021-01-25 VITALS — BP 118/72 | HR 82 | Temp 98.1°F | Resp 14 | Ht 68.0 in | Wt 160.7 lb

## 2021-01-25 DIAGNOSIS — I10 Essential (primary) hypertension: Secondary | ICD-10-CM

## 2021-01-25 DIAGNOSIS — N182 Chronic kidney disease, stage 2 (mild): Secondary | ICD-10-CM | POA: Diagnosis not present

## 2021-01-25 DIAGNOSIS — E1169 Type 2 diabetes mellitus with other specified complication: Secondary | ICD-10-CM

## 2021-01-25 DIAGNOSIS — E782 Mixed hyperlipidemia: Secondary | ICD-10-CM

## 2021-01-25 DIAGNOSIS — E119 Type 2 diabetes mellitus without complications: Secondary | ICD-10-CM | POA: Diagnosis not present

## 2021-01-25 NOTE — Assessment & Plan Note (Signed)
Recheck labs today. 

## 2021-01-25 NOTE — Progress Notes (Signed)
    SUBJECTIVE:   CHIEF COMPLAINT / HPI:   Hypertension: - Medications: norvasc '10mg'$ , losartan-HCTZ 50-12.'5mg'$  - Compliance: good - Checking BP at home: yes, 130s SBP - Denies any SOB, CP, vision changes, LE edema, medication SEs, or symptoms of hypotension  Diabetes, Type 2 - Last A1c 7.3 07/2020 - Medications: none - Compliance: n/a - Checking BG at home: no - Eye exam: due - Foot exam: due - Microalbumin: n/a - Statin: yes  HLD - medications: crestor '20mg'$  - compliance: good - medication SEs: none  Health Maintenance - due for shingles vaccine, COVID booster   OBJECTIVE:   BP 118/72   Pulse 82   Temp 98.1 F (36.7 C) (Oral)   Resp 14   Ht '5\' 8"'$  (1.727 m)   Wt 160 lb 11.2 oz (72.9 kg)   SpO2 93%   BMI 24.43 kg/m   Gen: elderly, well appearing, in NAD Card: RRR Lungs: CTAB Ext: WWP, no edema  ASSESSMENT/PLAN:   Essential hypertension Low normal for age but without orthostatic sx, no changes made today. Obtaining labs. F/u in 6 months.  Type 2 diabetes mellitus with other specified complication, without long-term current use of insulin (HCC) Recheck a1c today and treat as indicated with goal <8%. Encouraged to make f/u eye appointment.   Hyperlipidemia Compliant with statin, rechecking labs today.  CKD (chronic kidney disease) stage 2, GFR 60-89 ml/min Recheck labs today.     Myles Gip, DO

## 2021-01-25 NOTE — Assessment & Plan Note (Signed)
Recheck a1c today and treat as indicated with goal <8%. Encouraged to make f/u eye appointment.

## 2021-01-25 NOTE — Patient Instructions (Signed)
It was great to see you!  Our plans for today:  - No changes to your medications today.  - Make an appointment with your eye doctor soon. - Talk to your pharmacy about getting your shingles vaccine and COVID booster.   We are checking some labs today, we will release these results to your MyChart.  Take care and seek immediate care sooner if you develop any concerns.   Dr. Ky Barban

## 2021-01-25 NOTE — Assessment & Plan Note (Signed)
Low normal for age but without orthostatic sx, no changes made today. Obtaining labs. F/u in 6 months.

## 2021-01-25 NOTE — Assessment & Plan Note (Signed)
Compliant with statin, rechecking labs today.

## 2021-01-26 LAB — HEMOGLOBIN A1C
Hgb A1c MFr Bld: 7.1 % of total Hgb — ABNORMAL HIGH (ref <5.7)
Mean Plasma Glucose: 157 mg/dL
eAG (mmol/L): 8.7 mmol/L

## 2021-01-26 LAB — LIPID PANEL
Cholesterol: 86 mg/dL (ref <200)
HDL: 26 mg/dL — ABNORMAL LOW (ref >=50)
LDL Cholesterol (Calc): 45 mg/dL (calc)
Non-HDL Cholesterol (Calc): 60 mg/dL (calc) (ref <130)
Total CHOL/HDL Ratio: 3.3 (calc) (ref <5.0)
Triglycerides: 72 mg/dL (ref <150)

## 2021-01-26 LAB — BASIC METABOLIC PANEL
BUN/Creatinine Ratio: 16 (calc) (ref 6–22)
BUN: 17 mg/dL (ref 7–25)
CO2: 28 mmol/L (ref 20–32)
Calcium: 9.4 mg/dL (ref 8.6–10.4)
Chloride: 102 mmol/L (ref 98–110)
Creat: 1.07 mg/dL — ABNORMAL HIGH (ref 0.60–0.95)
Glucose, Bld: 121 mg/dL — ABNORMAL HIGH (ref 65–99)
Potassium: 3.7 mmol/L (ref 3.5–5.3)
Sodium: 139 mmol/L (ref 135–146)

## 2021-02-13 ENCOUNTER — Other Ambulatory Visit: Payer: Self-pay | Admitting: Family Medicine

## 2021-02-13 DIAGNOSIS — E782 Mixed hyperlipidemia: Secondary | ICD-10-CM

## 2021-03-08 ENCOUNTER — Other Ambulatory Visit: Payer: Self-pay | Admitting: Family Medicine

## 2021-03-08 DIAGNOSIS — I10 Essential (primary) hypertension: Secondary | ICD-10-CM

## 2021-03-09 ENCOUNTER — Other Ambulatory Visit: Payer: Self-pay | Admitting: Family Medicine

## 2021-03-09 DIAGNOSIS — I10 Essential (primary) hypertension: Secondary | ICD-10-CM

## 2021-06-07 ENCOUNTER — Ambulatory Visit: Payer: Self-pay | Admitting: *Deleted

## 2021-06-07 ENCOUNTER — Encounter: Payer: Self-pay | Admitting: Nurse Practitioner

## 2021-06-07 ENCOUNTER — Other Ambulatory Visit: Payer: Self-pay

## 2021-06-07 ENCOUNTER — Ambulatory Visit (INDEPENDENT_AMBULATORY_CARE_PROVIDER_SITE_OTHER): Payer: Medicare HMO | Admitting: Nurse Practitioner

## 2021-06-07 VITALS — BP 110/70 | HR 90 | Temp 98.2°F | Resp 14 | Ht 68.0 in | Wt 141.9 lb

## 2021-06-07 DIAGNOSIS — Z23 Encounter for immunization: Secondary | ICD-10-CM

## 2021-06-07 DIAGNOSIS — R42 Dizziness and giddiness: Secondary | ICD-10-CM | POA: Diagnosis not present

## 2021-06-07 DIAGNOSIS — E46 Unspecified protein-calorie malnutrition: Secondary | ICD-10-CM | POA: Diagnosis not present

## 2021-06-07 DIAGNOSIS — R1011 Right upper quadrant pain: Secondary | ICD-10-CM | POA: Diagnosis not present

## 2021-06-07 NOTE — Progress Notes (Signed)
BP 110/70   Pulse 90   Temp 98.2 F (36.8 C) (Oral)   Resp 14   Ht 5\' 8"  (1.727 m)   Wt 141 lb 14.4 oz (64.4 kg)   SpO2 94%   BMI 21.58 kg/m    Subjective:    Patient ID: Angela Horton, female    DOB: 24-Nov-1940, 80 y.o.   MRN: [id number]  HPI: Angela Horton is a 80 y.o. female, here alone  Chief Complaint  Patient presents with   Abdominal Pain    For 3 days    Dizziness    For 1 week   Abdominal pain:  She says she started having pain last week. She says it comes and goes and happens after eating.  She points to her RUQ when showing where the pain is.  She describes the pain as sharp. She denies any fever, diarrhea or constipation.   She denies any nausea or vomiting.  Will get labs and ultrasound.  She is agreeable to plan.   Dizziness:  She says the dizziness has only happened a couple of times.  She says it is when her abdominal pain is present she gets a little dizzy.  She says it only last a few seconds and then it goes away.  She says it does not happen when she changes position. She denies any headache, confusion, chest pain or shortness of breath.  She says that her blood pressure has been fine at home.    Malnutrition: She has lost about 19 lbs since last visit.  January 25, 2021 she weighed 160 lbs, today she weights 141 lbs.  She says that she has not been trying to lose weight.  She says she eats but not as much as she used to.  Encouraged her to eat what she can tolerate while we work up her abdominal pain.  She is agreeable with that plan.   Relevant past medical, surgical, family and social history reviewed and updated as indicated. Interim medical history since our last visit reviewed. Allergies and medications reviewed and updated.  Review of Systems  Constitutional: Negative for fever, positive for weight change.  Respiratory: Negative for cough and shortness of breath.   Cardiovascular: Negative for chest pain or palpitations.   Gastrointestinal:Positive for abdominal pain, no bowel changes.  Musculoskeletal: Negative for gait problem or joint swelling.  Skin: Negative for rash.  Neurological: Positive for dizziness, negative for headache.  No other specific complaints in a complete review of systems (except as listed in HPI above).      Objective:    BP 110/70   Pulse 90   Temp 98.2 F (36.8 C) (Oral)   Resp 14   Ht 5\' 8"  (1.727 m)   Wt 141 lb 14.4 oz (64.4 kg)   SpO2 94%   BMI 21.58 kg/m   Wt Readings from Last 3 Encounters:  06/07/21 141 lb 14.4 oz (64.4 kg)  01/25/21 160 lb 11.2 oz (72.9 kg)  01/11/21 163 lb 6.4 oz (74.1 kg)    Physical Exam  Constitutional: Patient appears well-developed and well-nourished. No distress.  HEENT: head atraumatic, normocephalic, pupils equal and reactive to light, ears TMs clear, neck supple Cardiovascular: Normal rate, regular rhythm and normal heart sounds.  No murmur heard. No BLE edema. Pulmonary/Chest: Effort normal and breath sounds normal. No respiratory distress. Abdominal: Soft.  RUQ abdominal tenderness Neuro: cranial nerves intact, equal grip strength, romberg test negative Psychiatric: Patient has a normal mood and  affect. behavior is normal. Judgment and thought content normal.   Results for orders placed or performed in visit on 62/56/38  Basic Metabolic Panel (BMET)  Result Value Ref Range   Glucose, Bld 121 (H) 65 - 99 mg/dL   BUN 17 7 - 25 mg/dL   Creat 1.07 (H) 0.60 - 0.95 mg/dL   BUN/Creatinine Ratio 16 6 - 22 (calc)   Sodium 139 135 - 146 mmol/L   Potassium 3.7 3.5 - 5.3 mmol/L   Chloride 102 98 - 110 mmol/L   CO2 28 20 - 32 mmol/L   Calcium 9.4 8.6 - 10.4 mg/dL  Lipid panel  Result Value Ref Range   Cholesterol 86 <200 mg/dL   HDL 26 (L) > OR = 50 mg/dL   Triglycerides 72 <150 mg/dL   LDL Cholesterol (Calc) 45 mg/dL (calc)   Total CHOL/HDL Ratio 3.3 <5.0 (calc)   Non-HDL Cholesterol (Calc) 60 <130 mg/dL (calc)  Hemoglobin A1c   Result Value Ref Range   Hgb A1c MFr Bld 7.1 (H) <5.7 % of total Hgb   Mean Plasma Glucose 157 mg/dL   eAG (mmol/L) 8.7 mmol/L      Assessment & Plan:   1. RUQ abdominal pain  - CBC with Differential/Platelet - COMPLETE METABOLIC PANEL WITH GFR - Lipase - Amylase - US ABDOMEN LIMITED RUQ (LIVER/GB); Future  2. Dizziness   CBC with Differential/Platelet - COMPLETE METABOLIC PANEL WITH GFR - Lipase - Amylase - US ABDOMEN LIMITED RUQ (LIVER/GB); Future  3. Malnutrition, unspecified type ([place])   CBC with Differential/Platelet - COMPLETE METABOLIC PANEL WITH GFR - Lipase - Amylase - US ABDOMEN LIMITED RUQ (LIVER/GB); Future  4. Need for influenza vaccination  - Flu Vaccine QUAD High Dose(Fluad)   Follow up plan: Return in about 2 weeks (around 06/21/2021) for follow up.

## 2021-06-07 NOTE — Telephone Encounter (Signed)
Reason for Disposition  [1] MODERATE dizziness (e.g., interferes with normal activities) AND [2] has NOT been evaluated by physician for this  (Exception: dizziness caused by heat exposure, sudden standing, or poor fluid intake)  Answer Assessment - Initial Assessment Questions 1. DESCRIPTION: "Describe your dizziness."     I returned pt's call.  She is c/o being dizzy and her stomach is "boiling".   It started Sunday.   They both started at the same time the dizziness and stomach. 2. LIGHTHEADED: "Do you feel lightheaded?" (e.g., somewhat faint, woozy, weak upon standing)     I'm dizzy when I get up in the mornings from bed.  I'm a little dizzy when getting up from a chair also.    3. VERTIGO: "Do you feel like either you or the room is spinning or tilting?" (i.e. vertigo)     No 4. SEVERITY: "How bad is it?"  "Do you feel like you are going to faint?" "Can you stand and walk?"   - MILD: Feels slightly dizzy, but walking normally.   - MODERATE: Feels unsteady when walking, but not falling; interferes with normal activities (e.g., school, work).   - SEVERE: Unable to walk without falling, or requires assistance to walk without falling; feels like passing out now.      The dizziness makes me weak a little bit. 5. ONSET:  "When did the dizziness begin?"     Sunday 6. AGGRAVATING FACTORS: "Does anything make it worse?" (e.g., standing, change in head position)     Getting up to walk around.   7. HEART RATE: "Can you tell me your heart rate?" "How many beats in 15 seconds?"  (Note: not all patients can do this)       No heart problems. 8. CAUSE: "What do you think is causing the dizziness?"     I don't know 9. RECURRENT SYMPTOM: "Have you had dizziness before?" If Yes, ask: "When was the last time?" "What happened that time?"     I had diarrhea Sunday but not now.   It lasted only on Sunday.   No vomiting.   It makes me feel weak.    10. OTHER SYMPTOMS: "Do you have any other symptoms?" (e.g.,  fever, chest pain, vomiting, diarrhea, bleeding)       No runny nose or sore throat, no fever.   My BP is alright.   Does not have diabetes.     C/o her stomach boiling and making her feel weak.   I can drink Ginger Ale and it helps the boiling.   Eating helps a little.   I'm eating soup which helps.   My stomach started boiling on Sunday.   Never had stomach boiling before.  I'm burping more than my usual.   Having normal BMs now.   BMs look my normal not dark looking.  I've been taking heart burn relief medicine and it helped a little bit. 11. PREGNANCY: "Is there any chance you are pregnant?" "When was your last menstrual period?"       N/A due to age  Protocols used: Dizziness - Lightheadedness-A-AH

## 2021-06-07 NOTE — Telephone Encounter (Signed)
I returned pt's call.   She had called in earlier today c/o issues with her stomach bubbling.    She is c/o her stomach "boiling" and being dizzy both since Sunday.   She is burping more than usual also.   No diarrhea at this point but did have diarrhea on Sunday.   She is c/o feeling weak since Sunday.  Denies any other symptoms.   Has not been sick recently with flu or Covid.  See triage notes.   I warm transferred her into [place] to be scheduled as [facility] does not schedule for any of their new providers.

## 2021-06-08 LAB — CBC WITH DIFFERENTIAL/PLATELET
Absolute Monocytes: 495 cells/uL (ref 200–950)
Basophils Absolute: 18 cells/uL (ref 0–200)
Basophils Relative: 0.4 %
Eosinophils Absolute: 9 cells/uL — ABNORMAL LOW (ref 15–500)
Eosinophils Relative: 0.2 %
HCT: 35.7 % (ref 35.0–45.0)
Hemoglobin: 11.9 g/dL (ref 11.7–15.5)
Lymphs Abs: 1953 cells/uL (ref 850–3900)
MCH: 28.3 pg (ref 27.0–33.0)
MCHC: 33.3 g/dL (ref 32.0–36.0)
MCV: 85 fL (ref 80.0–100.0)
MPV: 12 fL (ref 7.5–12.5)
Monocytes Relative: 11 %
Neutro Abs: 2025 cells/uL (ref 1500–7800)
Neutrophils Relative %: 45 %
Platelets: 268 10*3/uL (ref 140–400)
RBC: 4.2 10*6/uL (ref 3.80–5.10)
RDW: 13.2 % (ref 11.0–15.0)
Total Lymphocyte: 43.4 %
WBC: 4.5 10*3/uL (ref 3.8–10.8)

## 2021-06-08 LAB — COMPLETE METABOLIC PANEL WITH GFR
AG Ratio: 1.2 (calc) (ref 1.0–2.5)
ALT: 16 U/L (ref 6–29)
AST: 43 U/L — ABNORMAL HIGH (ref 10–35)
Albumin: 4 g/dL (ref 3.6–5.1)
Alkaline phosphatase (APISO): 52 U/L (ref 37–153)
BUN/Creatinine Ratio: 12 (calc) (ref 6–22)
BUN: 17 mg/dL (ref 7–25)
CO2: 31 mmol/L (ref 20–32)
Calcium: 9.8 mg/dL (ref 8.6–10.4)
Chloride: 92 mmol/L — ABNORMAL LOW (ref 98–110)
Creat: 1.4 mg/dL — ABNORMAL HIGH (ref 0.60–0.95)
Globulin: 3.4 g/dL (calc) (ref 1.9–3.7)
Glucose, Bld: 161 mg/dL — ABNORMAL HIGH (ref 65–99)
Potassium: 3.1 mmol/L — ABNORMAL LOW (ref 3.5–5.3)
Sodium: 134 mmol/L — ABNORMAL LOW (ref 135–146)
Total Bilirubin: 0.9 mg/dL (ref 0.2–1.2)
Total Protein: 7.4 g/dL (ref 6.1–8.1)
eGFR: 38 mL/min/{1.73_m2} — ABNORMAL LOW (ref >=60)

## 2021-06-08 LAB — LIPASE: Lipase: 108 U/L — ABNORMAL HIGH (ref 7–60)

## 2021-06-08 LAB — AMYLASE: Amylase: 69 U/L (ref 21–101)

## 2021-06-10 ENCOUNTER — Ambulatory Visit: Payer: Self-pay | Admitting: *Deleted

## 2021-06-10 ENCOUNTER — Telehealth: Payer: Self-pay | Admitting: Family Medicine

## 2021-06-10 ENCOUNTER — Other Ambulatory Visit: Payer: Self-pay | Admitting: Nurse Practitioner

## 2021-06-10 DIAGNOSIS — E876 Hypokalemia: Secondary | ICD-10-CM

## 2021-06-10 DIAGNOSIS — I10 Essential (primary) hypertension: Secondary | ICD-10-CM

## 2021-06-10 MED ORDER — VALSARTAN 40 MG PO TABS: 40.0000 mg | ORAL_TABLET | Freq: Every day | ORAL | 0 refills | Status: DC

## 2021-06-10 NOTE — Telephone Encounter (Signed)
Patient says received call from [facility]. Does she need to schedule an appt. Please call back.

## 2021-06-10 NOTE — Telephone Encounter (Signed)
I opened the chart to answer a question for the agent.  Pt was calling in returning a call.   No record of anyone calling her that I could find.  She is c/o her "stomach boiling" per agent.   I see where I triaged this pt for the same thing on 06/07/2021 and she was seen by Serafina Royals.   I let the agent know it must have been someone from the practice trying to contact the pt since there was no record of the [facility] trying to contact her.

## 2021-06-10 NOTE — Telephone Encounter (Signed)
I tried to assist the pt, did not want to schedule an appt, only wanted to speak directly with Bonnita Nasuti, please advise.

## 2021-06-13 NOTE — Telephone Encounter (Signed)
Done, spoke to patient

## 2021-06-14 ENCOUNTER — Ambulatory Visit: Payer: Medicare HMO

## 2021-06-20 ENCOUNTER — Ambulatory Visit: Payer: Medicare HMO

## 2021-06-20 NOTE — Telephone Encounter (Signed)
Copied from [place] [id number]. Topic: General - Other >> Jun 20, 2021  8:06 AM Fields, Museum/gallery conservator R wrote: Reason for CRM: pt daughter calling in to let pcp know that pt tested positive for covid so she canceled her ultrasound appt.

## 2021-06-20 NOTE — Telephone Encounter (Signed)
Called pt to inform her to reschedule [place] appointment when feels better.

## 2021-06-26 ENCOUNTER — Encounter: Payer: Self-pay | Admitting: Emergency Medicine

## 2021-06-26 ENCOUNTER — Other Ambulatory Visit: Payer: Self-pay

## 2021-06-26 ENCOUNTER — Emergency Department
Admission: EM | Admit: 2021-06-26 | Discharge: 2021-06-26 | Disposition: A | Discharge status: Home / Self Care | Payer: Medicare HMO | Attending: Emergency Medicine | Admitting: Emergency Medicine

## 2021-06-26 ENCOUNTER — Emergency Department: Payer: Medicare HMO

## 2021-06-26 DIAGNOSIS — Z79899 Other long term (current) drug therapy: Secondary | ICD-10-CM | POA: Diagnosis not present

## 2021-06-26 DIAGNOSIS — R42 Dizziness and giddiness: Secondary | ICD-10-CM | POA: Diagnosis not present

## 2021-06-26 DIAGNOSIS — I129 Hypertensive chronic kidney disease with stage 1 through stage 4 chronic kidney disease, or unspecified chronic kidney disease: Secondary | ICD-10-CM | POA: Insufficient documentation

## 2021-06-26 DIAGNOSIS — R519 Headache, unspecified: Secondary | ICD-10-CM | POA: Diagnosis not present

## 2021-06-26 DIAGNOSIS — E876 Hypokalemia: Secondary | ICD-10-CM | POA: Diagnosis not present

## 2021-06-26 DIAGNOSIS — N182 Chronic kidney disease, stage 2 (mild): Secondary | ICD-10-CM | POA: Insufficient documentation

## 2021-06-26 DIAGNOSIS — E1122 Type 2 diabetes mellitus with diabetic chronic kidney disease: Secondary | ICD-10-CM | POA: Diagnosis not present

## 2021-06-26 LAB — BASIC METABOLIC PANEL WITH GFR
Anion gap: 8 (ref 5–15)
BUN: 10 mg/dL (ref 8–23)
CO2: 28 mmol/L (ref 22–32)
Calcium: 9.3 mg/dL (ref 8.9–10.3)
Chloride: 94 mmol/L — ABNORMAL LOW (ref 98–111)
Creatinine, Ser: 1.09 mg/dL — ABNORMAL HIGH (ref 0.44–1.00)
GFR, Estimated: 51 mL/min — ABNORMAL LOW
Glucose, Bld: 152 mg/dL — ABNORMAL HIGH (ref 70–99)
Potassium: 2.9 mmol/L — ABNORMAL LOW (ref 3.5–5.1)
Sodium: 130 mmol/L — ABNORMAL LOW (ref 135–145)

## 2021-06-26 LAB — CBC
HCT: 33.1 % — ABNORMAL LOW (ref 36.0–46.0)
Hemoglobin: 11.1 g/dL — ABNORMAL LOW (ref 12.0–15.0)
MCH: 28.3 pg (ref 26.0–34.0)
MCHC: 33.5 g/dL (ref 30.0–36.0)
MCV: 84.4 fL (ref 80.0–100.0)
Platelets: 263 K/uL (ref 150–400)
RBC: 3.92 MIL/uL (ref 3.87–5.11)
RDW: 15 % (ref 11.5–15.5)
WBC: 3.8 K/uL — ABNORMAL LOW (ref 4.0–10.5)
nRBC: 0 % (ref 0.0–0.2)

## 2021-06-26 LAB — HEPATIC FUNCTION PANEL
ALT: 21 U/L (ref 0–44)
AST: 61 U/L — ABNORMAL HIGH (ref 15–41)
Albumin: 3.6 g/dL (ref 3.5–5.0)
Alkaline Phosphatase: 45 U/L (ref 38–126)
Bilirubin, Direct: 0.1 mg/dL (ref 0.0–0.2)
Indirect Bilirubin: 0.7 mg/dL (ref 0.3–0.9)
Total Bilirubin: 0.8 mg/dL (ref 0.3–1.2)
Total Protein: 7.7 g/dL (ref 6.5–8.1)

## 2021-06-26 LAB — MAGNESIUM: Magnesium: 2.1 mg/dL (ref 1.7–2.4)

## 2021-06-26 IMAGING — CT CT HEAD W/O CM
4 series · 17 of 47 positions shown, 19 images · non-contrast
Comparison: None

CLINICAL DATA: Dizziness, mild headache, and shakiness since last
night, dizziness in the mornings when she stands up, some shortness
of breath, on blood pressure medications, history diabetes mellitus,
hypertension

EXAM:
CT HEAD WITHOUT CONTRAST
TECHNIQUE: Contiguous axial images were obtained from the base of the skull
through the vertex without intravenous contrast.

[Series 2: head wo · axial · 0.43mm/px · z∈[+327,+442]mm · 6 of 33 slices shown, 8 images]
[im 5/33  brain]
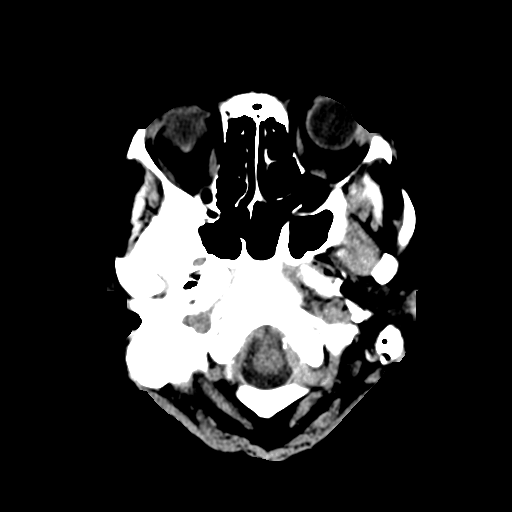
[im 5/33  bone]
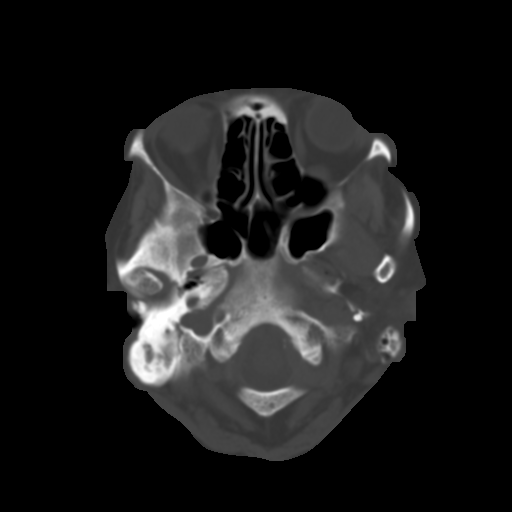
[im 10/33  brain]
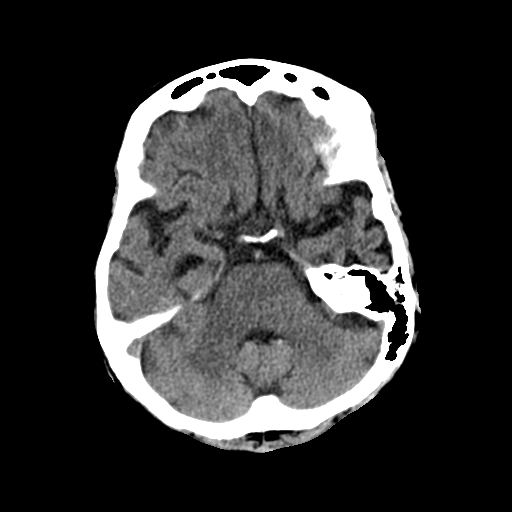
[im 14/33  brain]
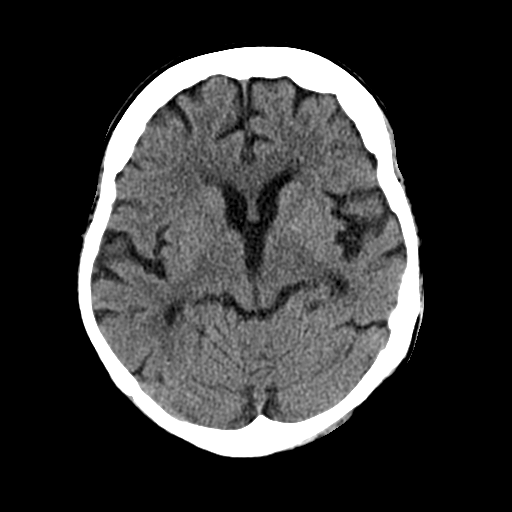
[im 19/33  brain]
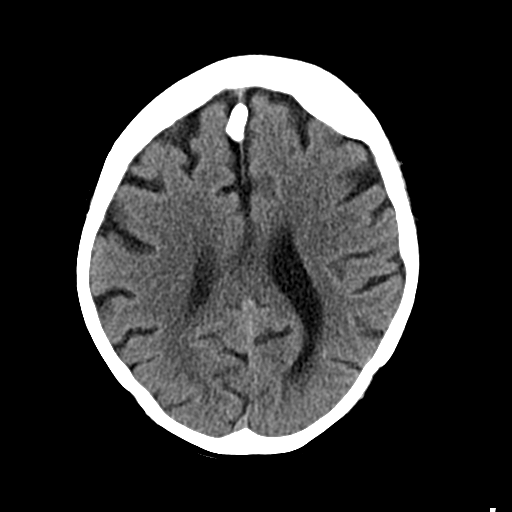
[im 23/33  brain]
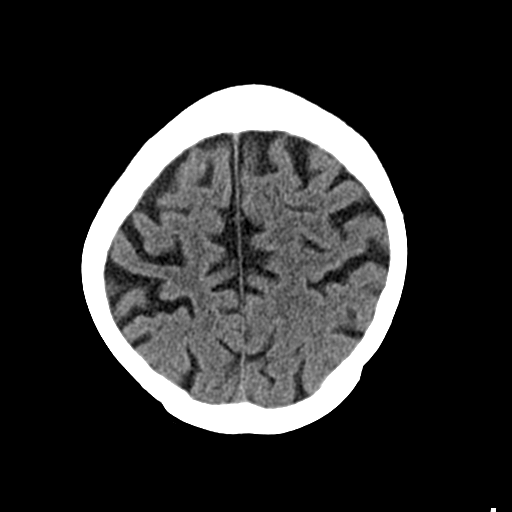
[im 23/33  bone]
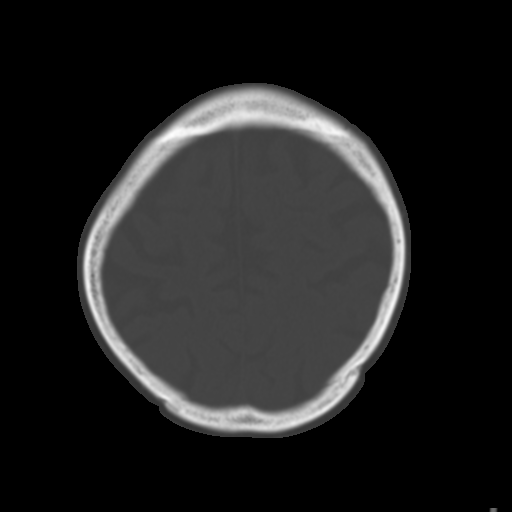
[im 28/33  brain]
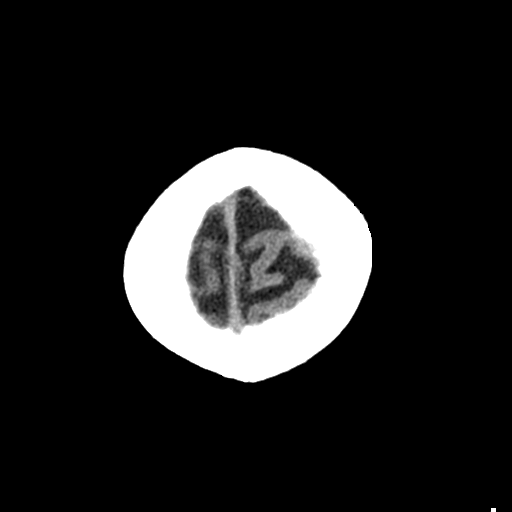

[Series 3: head bone · axial · 0.43mm/px · z∈[+321,+401]mm · 5 of 84 slices shown]
[im 8/84  bone]
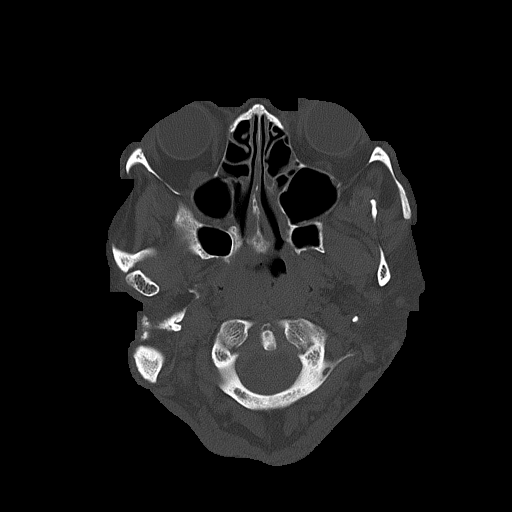
[im 16/84  bone]
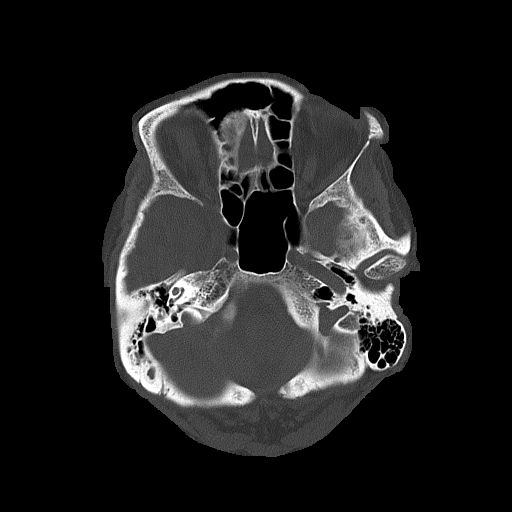
[im 28/84  bone]
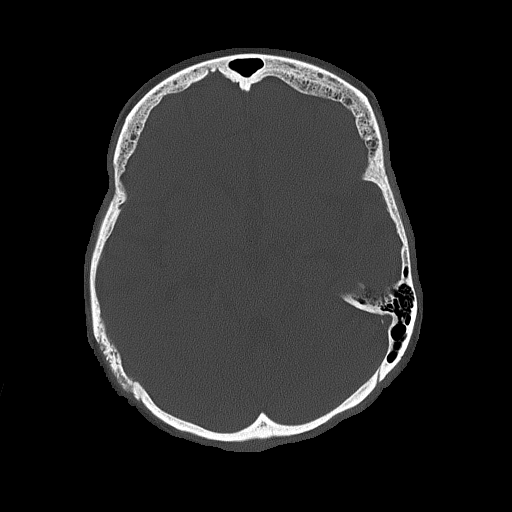
[im 36/84  bone]
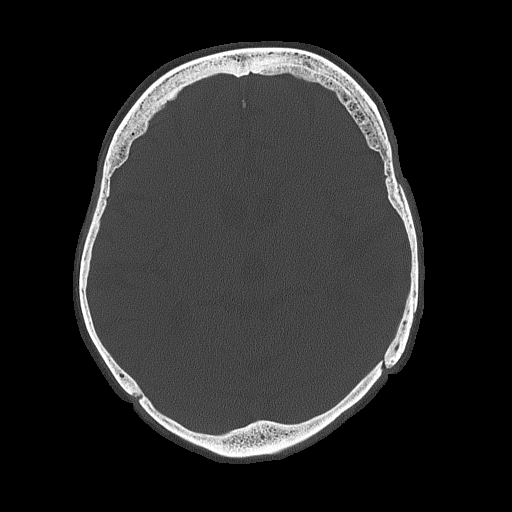
[im 48/84  bone]
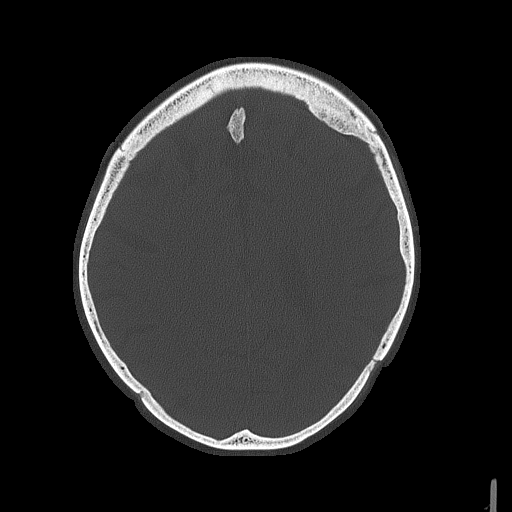

[Series 4: coronal soft tissue · coronal · 0.34mm/px · 3 of 71 slices shown]
[im 24/71  brain]
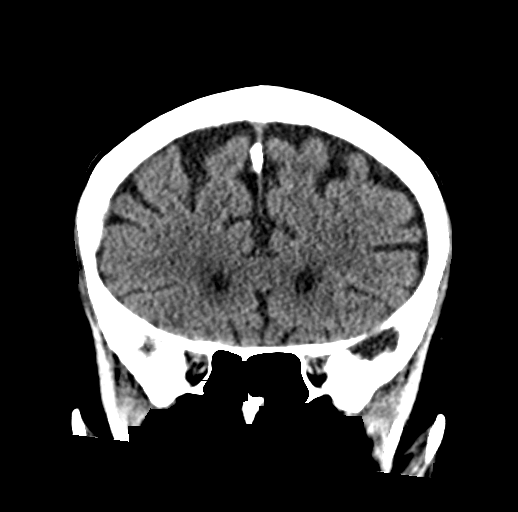
[im 32/71  brain]
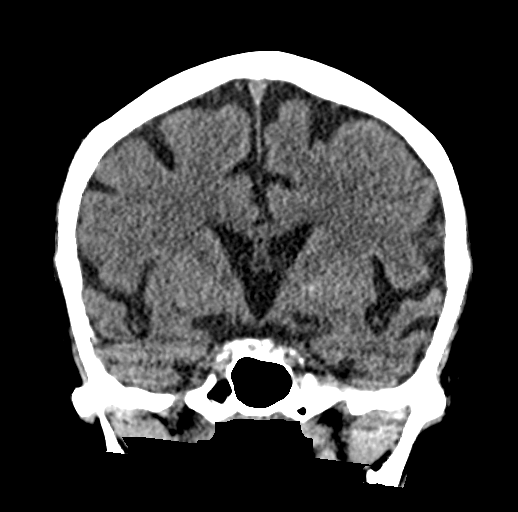
[im 39/71  brain]
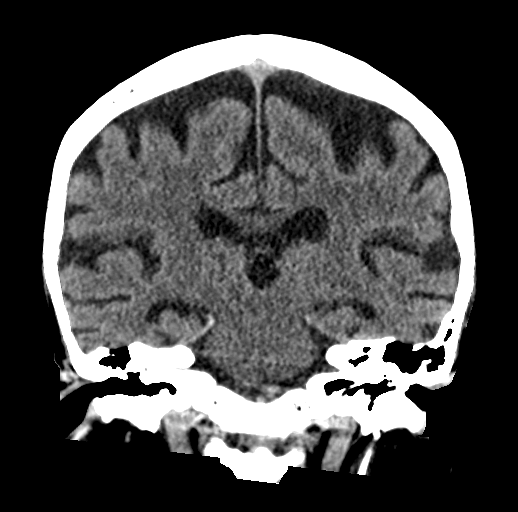

[Series 5: sagittal soft tissue · sagittal · 0.35mm/px · 3 of 56 slices shown]
[im 19/56  brain]
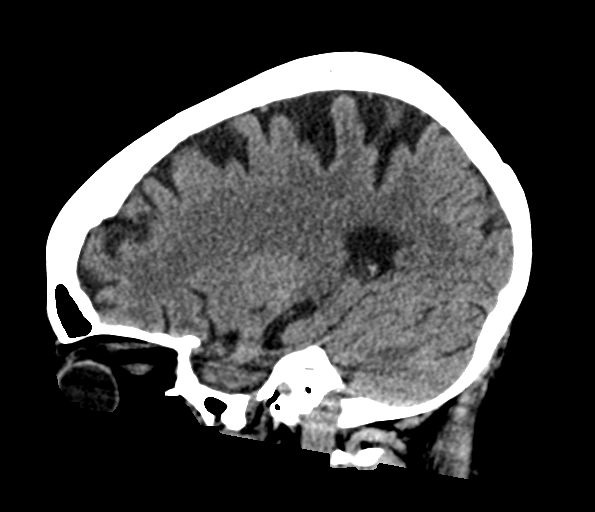
[im 28/56  brain]
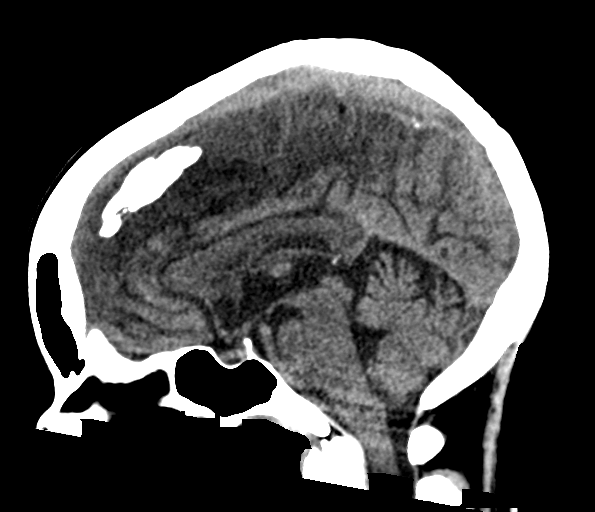
[im 37/56  brain]
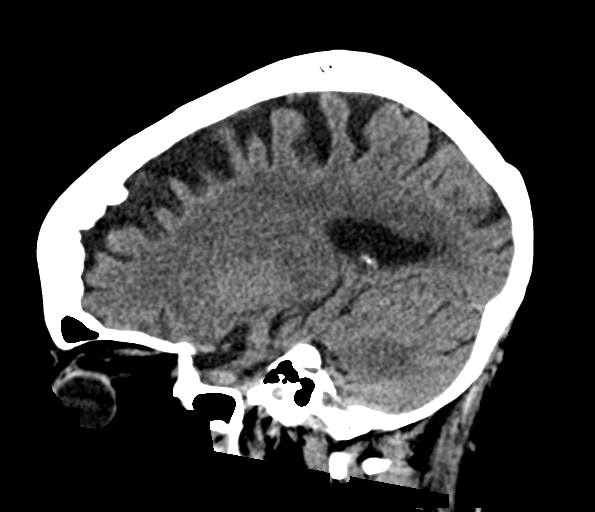

[17 of 47 positions shown; findings below may reference images not displayed]

FINDINGS: Brain: Generalized atrophy. Normal ventricular morphology. No
midline shift or mass effect. Old appearing lacunar infarct LEFT
thalamus. Mild small vessel chronic ischemic changes of deep
cerebral white matter. No intracranial hemorrhage, mass lesion, or
evidence of acute infarction. No extra-axial fluid collections.
Dense ossification within anterior falx.

Vascular: No hyperdense vessels. Mild atherosclerotic calcification
of internal carotid and vertebral arteries at skull base

Skull: Intact

Sinuses/Orbits: Clear

Other: N/A
IMPRESSION: Atrophy with small vessel chronic ischemic changes of deep cerebral
white matter.

Old appearing lacunar infarct LEFT thalamus.

No acute intracranial abnormalities.

## 2021-06-26 MED ORDER — POTASSIUM CHLORIDE 20 MEQ PO PACK
40.0000 meq | PACK | Freq: Once | ORAL | Status: AC
  Administered 2021-06-26: 22:00:00 40 meq via ORAL
  Filled 2021-06-26: qty 2

## 2021-06-26 MED ORDER — POTASSIUM CHLORIDE 10 MEQ/100ML IV SOLN
10.0000 meq | INTRAVENOUS | Status: DC
  Filled 2021-06-26: qty 100

## 2021-06-26 MED ORDER — POTASSIUM CHLORIDE CRYS ER 20 MEQ PO TBCR
40.0000 meq | EXTENDED_RELEASE_TABLET | Freq: Once | ORAL | Status: AC
  Administered 2021-06-26: 21:00:00 40 meq via ORAL
  Filled 2021-06-26: qty 2

## 2021-06-26 MED ORDER — LACTATED RINGERS IV BOLUS: 1000.0000 mL | Freq: Once | INTRAVENOUS | Status: DC

## 2021-06-26 NOTE — ED Triage Notes (Signed)
Pt via [facility] c/o dizziness with mild headache and "shakiness", denies LOC. Symptoms started last night when pt got up to go to the restroom. Her husband notes that she has been feeling dizzy in the mornings when she stands up. She also reports some SOB but denies diaphoresis, nausea, CP. Pt takes BP medications including amlodipine and one other BP med, not sure of the name. She is noticing her dizziness before her morning meds. Her typical BP is usually around 120/60 after meds, unsure of pre-medication baseline BP.

## 2021-06-26 NOTE — ED Notes (Signed)
IV attempts x 2 in the right and left AC's without success. Another RN notified, pt tolerated well

## 2021-06-26 NOTE — ED Notes (Signed)
Patient transported to CT 

## 2021-06-26 NOTE — ED Notes (Signed)
Dr. Jessup at the bedside 

## 2021-06-26 NOTE — ED Notes (Signed)
Report received from Stephen, RN 

## 2021-06-26 NOTE — ED Provider Notes (Signed)
[facility] Emergency Department Provider Note   ____________________________________________   Event Date/Time   First MD Initiated Contact with Patient 06/26/21 1805     (approximate)  I have reviewed the triage vital signs and the nursing notes.   HISTORY  Chief Complaint Dizziness    HPI Angela Horton is a 80 y.o. female with past medical history of hypertension, hyperlipidemia, diabetes, and CKD who presents to the ED complaining of dizziness.  Patient reports that she has been feeling shaky with intermittent dizziness for about the past week.  She reports feeling tremulous in her extremities and when she goes to stand up, begins to feel dizzy.  She denies feeling like she is going to pass out, describes symptoms more as like things are spinning around her.  She denies any vision changes, speech changes, numbness, or weakness.  She has never had similar symptoms in the past, denies any history of vertigo.  Her husband at bedside does state that she has not been eating and drinking very well lately.  She denies any fevers, cough, chest pain, shortness of breath, abdominal pain, nausea, vomiting, or dysuria.        Past Medical History:  Diagnosis Date   Diabetes mellitus without complication ([place])    Gout    Hyperlipidemia    Hypertension    Obesity     Patient Active Problem List   Diagnosis Date Noted   CKD (chronic kidney disease) stage 2, GFR 60-89 ml/min 07/26/2018   Premature atrial contractions 10/12/2015   Essential hypertension 01/07/2015   Hyperlipidemia 01/07/2015   Type 2 diabetes mellitus with other specified complication, without long-term current use of insulin ([place])     Past Surgical History:  Procedure Laterality Date   ABDOMINAL HYSTERECTOMY      Prior to Admission medications   Medication Sig Start Date End Date Taking? Authorizing Provider  amLODipine (NORVASC) 10 MG tablet TAKE 1 TABLET BY MOUTH EVERY DAY 03/09/21    Myles Gip, DO  brimonidine (ALPHAGAN) 0.2 % ophthalmic solution Place 1 drop into both eyes 2 (two) times daily. 01/01/15   [provider]  Omega-3 Fatty Acids (FISH OIL) 1000 MG CAPS Take 1 capsule by mouth daily.    [provider]  rosuvastatin (CRESTOR) 20 MG tablet TAKE 1 TABLET BY MOUTH EVERYDAY AT BEDTIME 02/14/21   Delsa Grana, PA-C  timolol (TIMOPTIC) 0.5 % ophthalmic solution INSTILL 1 DROP INTO BOTH EYES EVERY MORNING 09/01/15   [provider]  valsartan (DIOVAN) 40 MG tablet Take 1 tablet (40 mg total) by mouth daily. 06/10/21 07/10/21  Bo Merino, FNP    Allergies Metformin and related  Family History  Problem Relation Age of Onset   Kidney disease Mother    Kidney disease Sister    Stroke Brother    Heart disease Sister    Stroke Sister     Social History Social History   Tobacco Use   Smoking status: Never   Smokeless tobacco: Never   Tobacco comments:    Smoking cessation materials not required  Vaping Use   Vaping Use: Never used  Substance Use Topics   Alcohol use: No    Alcohol/week: 0.0 standard drinks   Drug use: No    Review of Systems  Constitutional: No fever/chills.  Positive for generalized weakness. Eyes: No visual changes. ENT: No sore throat. Cardiovascular: Denies chest pain. Respiratory: Denies shortness of breath. Gastrointestinal: No abdominal pain.  No nausea,  no vomiting.  No diarrhea.  No constipation. Genitourinary: Negative for dysuria. Musculoskeletal: Negative for back pain. Skin: Negative for rash. Neurological: Negative for headaches, focal weakness or numbness.  Positive for dizziness and tremors.  ____________________________________________   PHYSICAL EXAM:  VITAL SIGNS: ED Triage Vitals  Enc Vitals Group     BP 06/26/21 1734 120/76     Pulse Rate 06/26/21 1734 98     Resp 06/26/21 1734 15     Temp 06/26/21 1734 98.4 F (36.9 C)     Temp Source 06/26/21 1734 Oral     SpO2  06/26/21 1734 99 %     Weight 06/26/21 1735 160 lb (72.6 kg)     Height 06/26/21 1735 5\' 8"  (1.727 m)     Head Circumference --      Peak Flow --      Pain Score 06/26/21 1735 0     Pain Loc --      Pain Edu? --      Excl. in [place]? --     Constitutional: Alert and oriented. Eyes: Conjunctivae are normal. Head: Atraumatic. Nose: No congestion/rhinnorhea. Mouth/Throat: Mucous membranes are moist. Neck: Normal ROM Cardiovascular: Normal rate, regular rhythm. Grossly normal heart sounds.  2+ radial pulses bilaterally. Respiratory: Normal respiratory effort.  No retractions. Lungs CTAB. Gastrointestinal: Soft and nontender. No distention. Genitourinary: deferred Musculoskeletal: No lower extremity tenderness nor edema. Neurologic:  Normal speech and language. No gross focal neurologic deficits are appreciated.  Mild tremors noted to bilateral arms. Skin:  Skin is warm, dry and intact. No rash noted. Psychiatric: Mood and affect are normal. Speech and behavior are normal.  ____________________________________________   LABS (all labs ordered are listed, but only abnormal results are displayed)  Labs Reviewed  BASIC METABOLIC PANEL - Abnormal; Notable for the following components:      Result Value   Sodium 130 (*)    Potassium 2.9 (*)    Chloride 94 (*)    Glucose, Bld 152 (*)    Creatinine, Ser 1.09 (*)    GFR, Estimated 51 (*)    All other components within normal limits  CBC - Abnormal; Notable for the following components:   WBC 3.8 (*)    Hemoglobin 11.1 (*)    HCT 33.1 (*)    All other components within normal limits  HEPATIC FUNCTION PANEL - Abnormal; Notable for the following components:   AST 61 (*)    All other components within normal limits  MAGNESIUM  URINALYSIS, ROUTINE W REFLEX MICROSCOPIC  CBG MONITORING, ED   ____________________________________________  EKG  ED ECG REPORT I, Blake Divine, the attending physician, personally viewed and interpreted  this ECG.   Date: 06/26/2021  EKG Time: 17:42  Rate: 97  Rhythm: normal sinus rhythm  Axis: Normal  Intervals:none  ST&T Change: None   PROCEDURES  Procedure(s) performed (including Critical Care):  Procedures   ____________________________________________   INITIAL IMPRESSION / ASSESSMENT AND PLAN / ED COURSE      80 year old female with past medical history of hypertension, hyperlipidemia, diabetes, and CKD who presents to the ED complaining of 1 week of tremors with associated dizziness and sensation of the room spinning around her when she changes certain positions.  She has no focal neurologic deficits on exam, symptoms sound most consistent with a peripheral vertigo and I have lower suspicion for central cause.  However, given her advanced age and risk factors, we will check CT head for evidence of stroke or other acute  process.  Also plan to check orthostatic vital signs with her reported diminished p.o. intake, plan to hydrate with IV fluids.  Labs thus far remarkable for hypokalemia, which we will replete and add on magnesium level.  Magnesium level within normal limits, orthostatic vital signs are unremarkable and patient is now ambulatory without dizziness or other difficulty.  CT head is remarkable for old lacunar stroke but no evidence of acute stroke.  Given apparent resolution of symptoms, very low suspicion for acute stroke not seen on CT.  Patient has been a difficult IV stick and we have been unable to give IV fluids.  At this point, she and family wish to return home, which is reasonable given reassuring work-up.  We will give additional oral potassium and patient counseled to follow-up with her PCP for recheck of potassium levels.  She was counseled to return to the ED for new worsening symptoms, patient agrees with plan.      ____________________________________________   FINAL CLINICAL IMPRESSION(S) / ED DIAGNOSES  Final diagnoses:  Dizziness  Hypokalemia      ED Discharge Orders     None        Note:  This document was prepared using Dragon voice recognition software and may include unintentional dictation errors.    Blake Divine, MD 06/26/21 2156

## 2021-06-26 NOTE — ED Notes (Signed)
Orthostatic VS   2015--lying--126/68, HR--79  2020--sitting--127/67, HR-- 82  2025--Standing with walker--126/77, HR--96  ** Patient reports no dizziness while sitting or standing **  Dr. Charna Archer notified and aware.

## 2021-06-29 ENCOUNTER — Encounter: Payer: Self-pay | Admitting: Family Medicine

## 2021-07-01 ENCOUNTER — Other Ambulatory Visit: Payer: Self-pay

## 2021-07-01 ENCOUNTER — Encounter: Payer: Self-pay | Admitting: Emergency Medicine

## 2021-07-01 ENCOUNTER — Emergency Department: Payer: Medicare HMO

## 2021-07-01 ENCOUNTER — Emergency Department
Admission: EM | Admit: 2021-07-01 | Discharge: 2021-07-01 | Disposition: A | Discharge status: Home / Self Care | Payer: Medicare HMO | Attending: Emergency Medicine | Admitting: Emergency Medicine

## 2021-07-01 DIAGNOSIS — R0789 Other chest pain: Secondary | ICD-10-CM | POA: Diagnosis not present

## 2021-07-01 DIAGNOSIS — R079 Chest pain, unspecified: Secondary | ICD-10-CM | POA: Diagnosis not present

## 2021-07-01 DIAGNOSIS — Z79899 Other long term (current) drug therapy: Secondary | ICD-10-CM | POA: Diagnosis not present

## 2021-07-01 DIAGNOSIS — E1122 Type 2 diabetes mellitus with diabetic chronic kidney disease: Secondary | ICD-10-CM | POA: Insufficient documentation

## 2021-07-01 DIAGNOSIS — I129 Hypertensive chronic kidney disease with stage 1 through stage 4 chronic kidney disease, or unspecified chronic kidney disease: Secondary | ICD-10-CM | POA: Diagnosis not present

## 2021-07-01 DIAGNOSIS — N182 Chronic kidney disease, stage 2 (mild): Secondary | ICD-10-CM | POA: Diagnosis not present

## 2021-07-01 DIAGNOSIS — R42 Dizziness and giddiness: Secondary | ICD-10-CM | POA: Diagnosis not present

## 2021-07-01 DIAGNOSIS — R531 Weakness: Secondary | ICD-10-CM | POA: Diagnosis not present

## 2021-07-01 LAB — CBC
HCT: 32.9 % — ABNORMAL LOW (ref 36.0–46.0)
Hemoglobin: 10.8 g/dL — ABNORMAL LOW (ref 12.0–15.0)
MCH: 28.1 pg (ref 26.0–34.0)
MCHC: 32.8 g/dL (ref 30.0–36.0)
MCV: 85.5 fL (ref 80.0–100.0)
Platelets: 286 10*3/uL (ref 150–400)
RBC: 3.85 MIL/uL — ABNORMAL LOW (ref 3.87–5.11)
RDW: 15.8 % — ABNORMAL HIGH (ref 11.5–15.5)
WBC: 2.9 10*3/uL — ABNORMAL LOW (ref 4.0–10.5)
nRBC: 0 % (ref 0.0–0.2)

## 2021-07-01 LAB — TROPONIN I (HIGH SENSITIVITY)
Troponin I (High Sensitivity): 7 ng/L (ref <18)
Troponin I (High Sensitivity): 7 ng/L (ref <18)

## 2021-07-01 LAB — URINALYSIS, ROUTINE W REFLEX MICROSCOPIC
Bacteria, UA: NONE SEEN
Bilirubin Urine: NEGATIVE
Glucose, UA: NEGATIVE mg/dL
Hgb urine dipstick: NEGATIVE
Ketones, ur: NEGATIVE mg/dL
Leukocytes,Ua: NEGATIVE
Nitrite: NEGATIVE
Protein, ur: NEGATIVE mg/dL
Specific Gravity, Urine: 1.014 (ref 1.005–1.030)
pH: 6 (ref 5.0–8.0)

## 2021-07-01 LAB — BASIC METABOLIC PANEL
Anion gap: 8 (ref 5–15)
BUN: 11 mg/dL (ref 8–23)
CO2: 23 mmol/L (ref 22–32)
Calcium: 8.8 mg/dL — ABNORMAL LOW (ref 8.9–10.3)
Chloride: 102 mmol/L (ref 98–111)
Creatinine, Ser: 0.95 mg/dL (ref 0.44–1.00)
GFR, Estimated: 60 mL/min — ABNORMAL LOW (ref >60)
Glucose, Bld: 144 mg/dL — ABNORMAL HIGH (ref 70–99)
Potassium: 3.8 mmol/L (ref 3.5–5.1)
Sodium: 133 mmol/L — ABNORMAL LOW (ref 135–145)

## 2021-07-01 IMAGING — CR DG CHEST 2V
1 series · 2 of 2 positions shown · non-contrast
Comparison: None.

CLINICAL DATA: Chest pain and weakness.

EXAM:
CHEST - 2 VIEW

[Series 1: dg chest 2 view · 0.14mm/px · 2 of 2 slices shown]
[im 1/2]
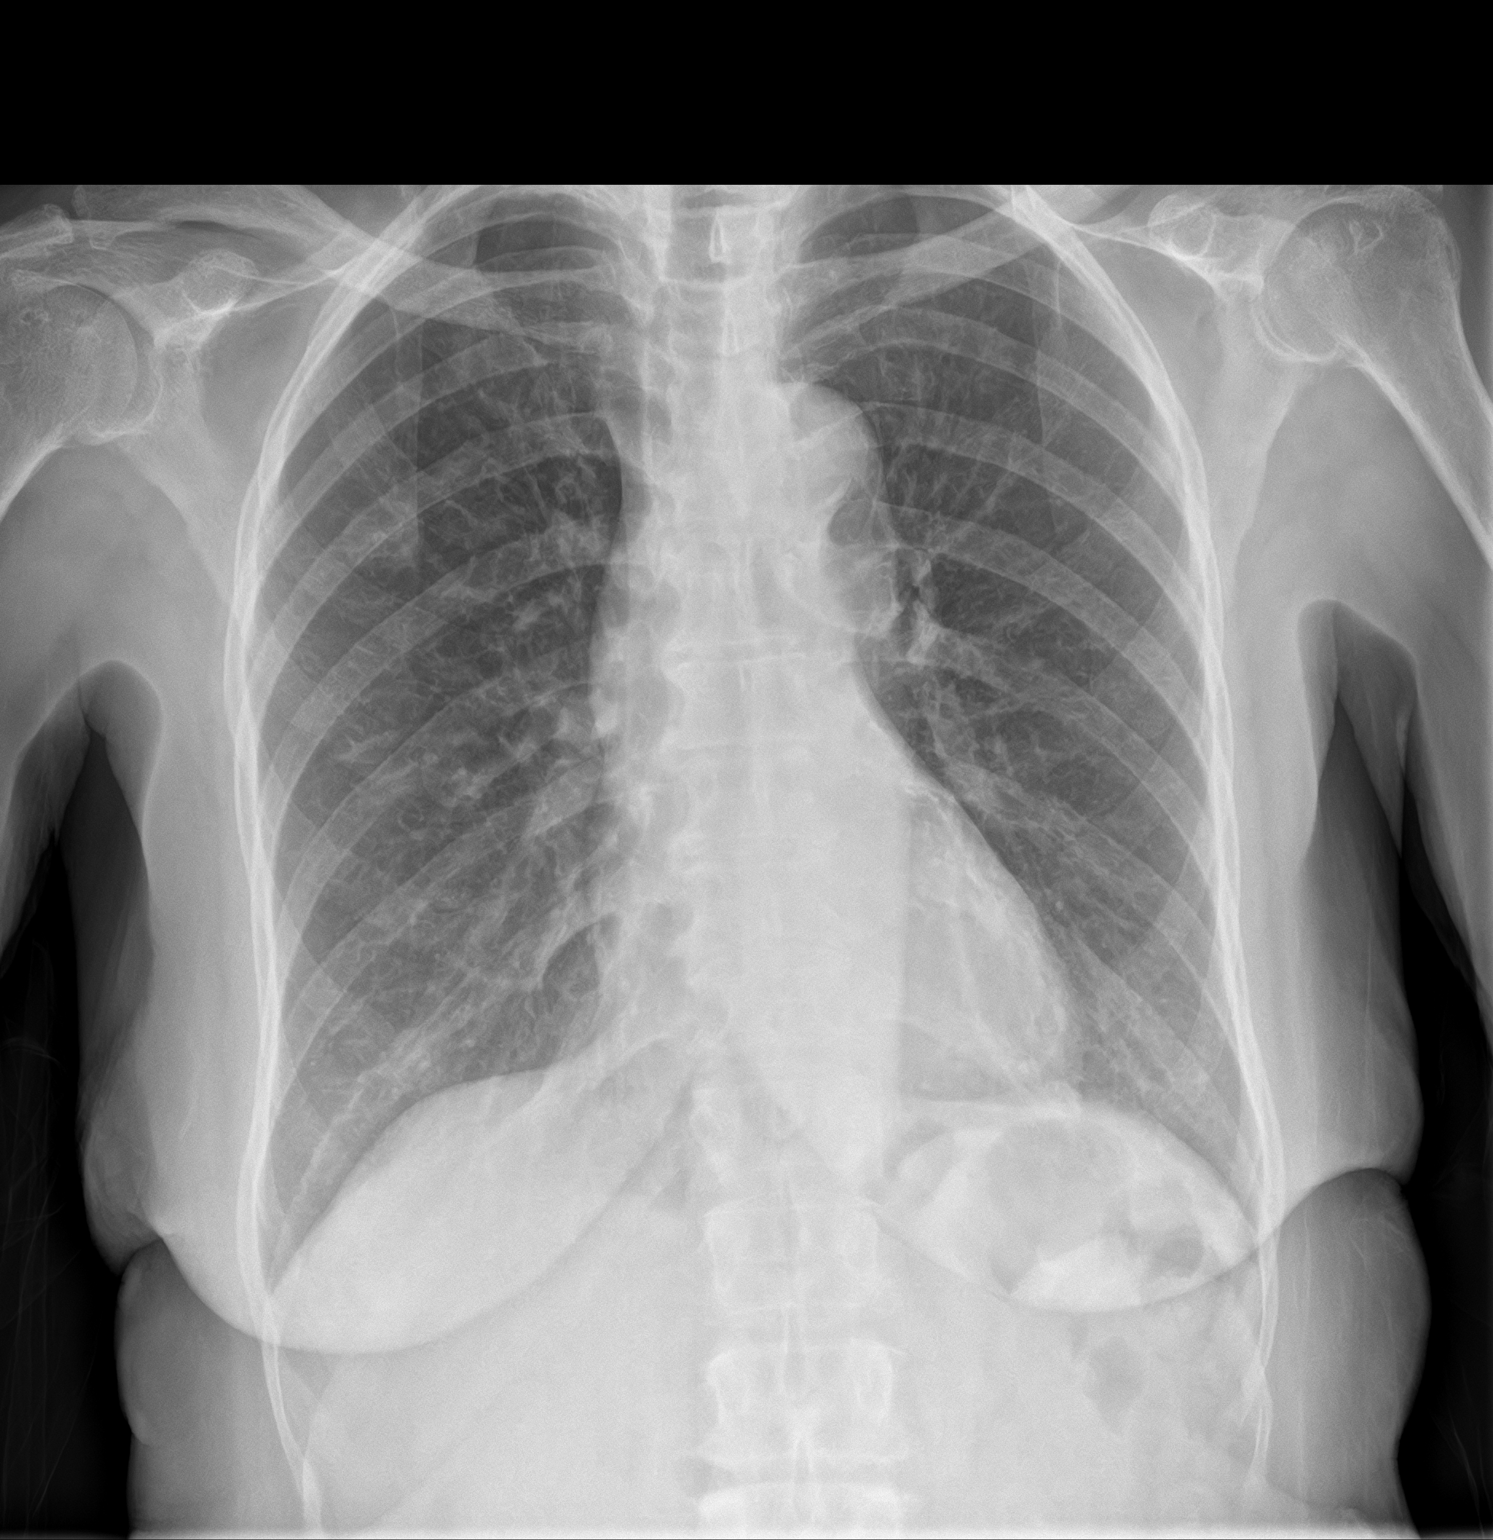
[im 2/2]
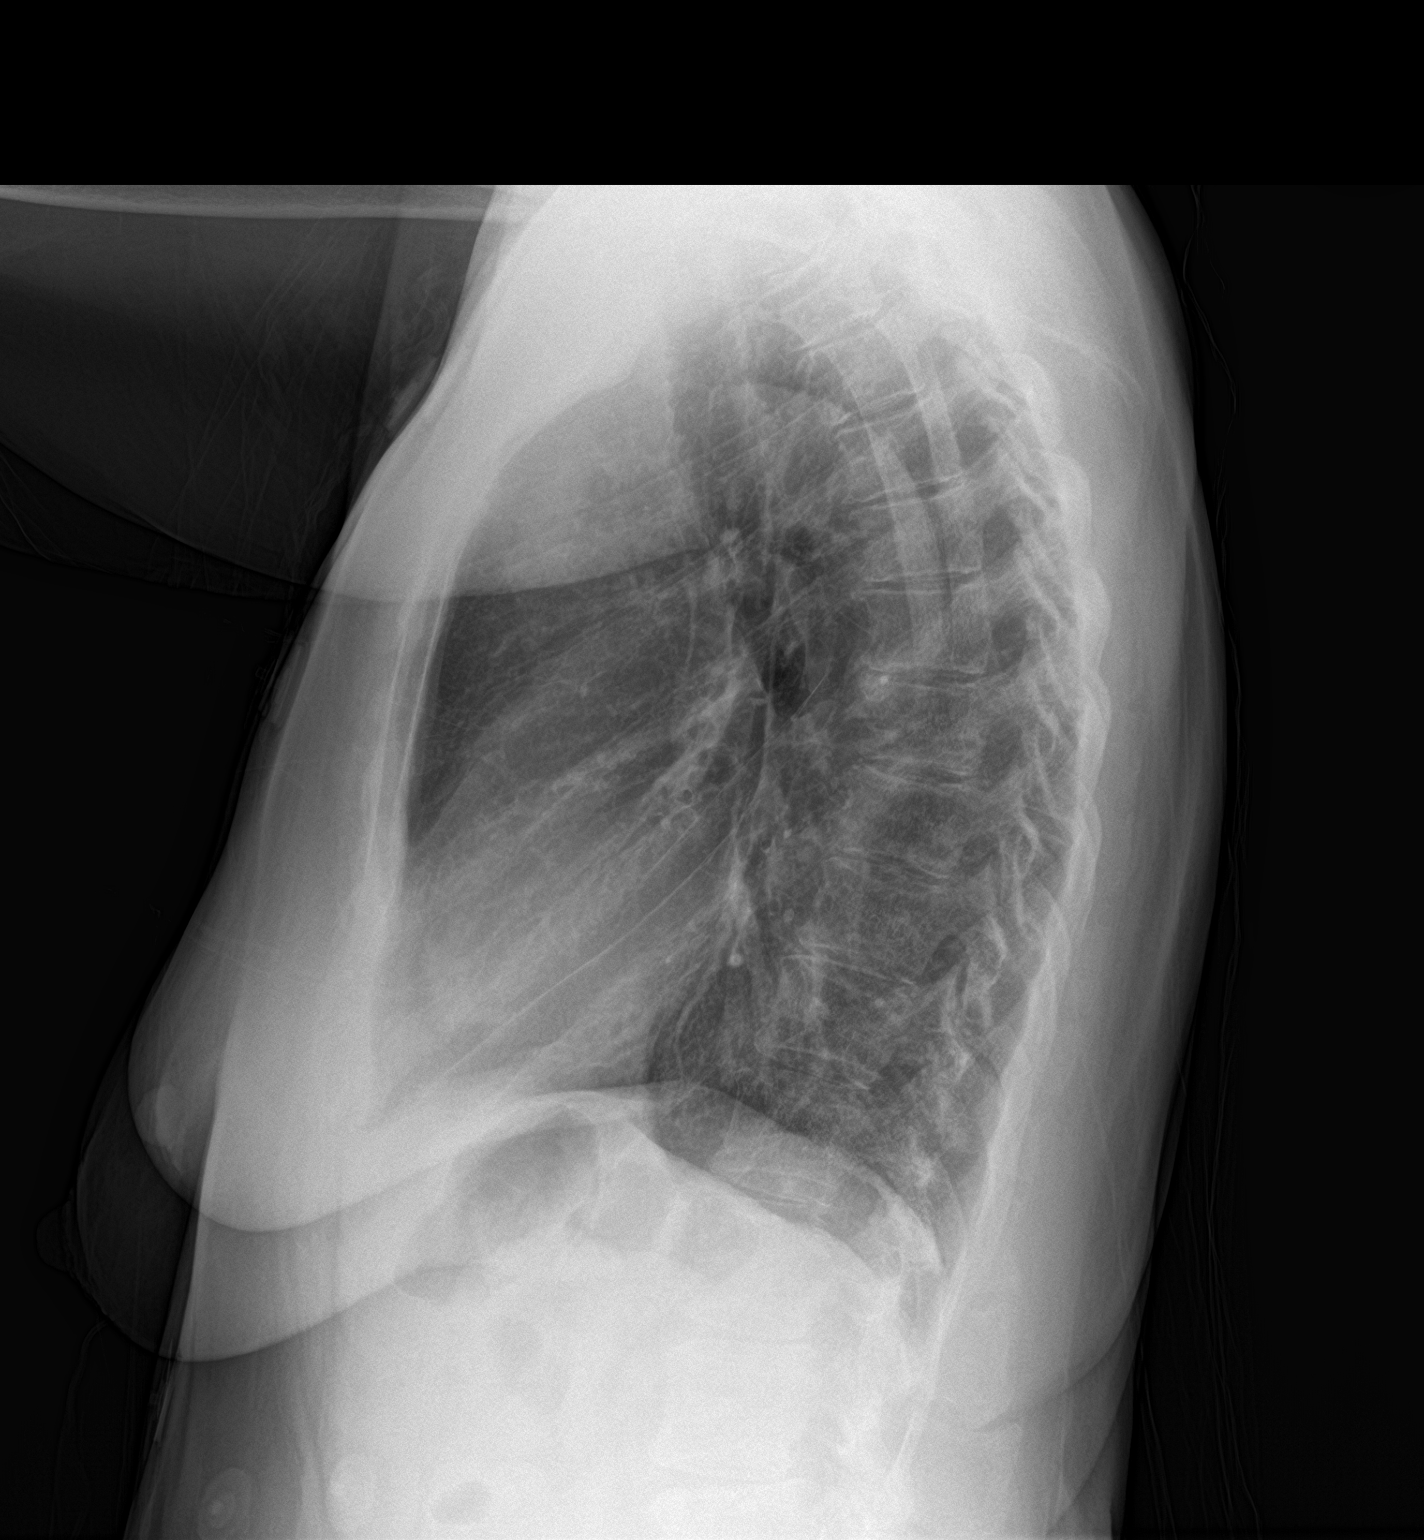

[2 of 2 positions shown; findings below may reference images not displayed]

FINDINGS: The heart size and mediastinal contours are within normal limits.
Normal pulmonary vascularity. ECG electrode artifact over the right
upper and lower lung. Mild left basilar atelectasis/scarring. No
focal consolidation, pleural effusion, or pneumothorax. No acute
osseous abnormality.
IMPRESSION: No active cardiopulmonary disease.

## 2021-07-01 NOTE — Discharge Instructions (Signed)
Your EKG, cardiac enzymes and blood work were all reassuring.  If you have recurrent chest pain, or develop any new symptoms such as shortness of breath nausea vomiting that are concerning to you, please return to the emergency department.  Please follow-up with your primary care provider.

## 2021-07-01 NOTE — Telephone Encounter (Signed)
Called patient Lvm to get her an appt set up for her about her agree with conducting the memory/mental test and potassium supplementation regimen.

## 2021-07-01 NOTE — Telephone Encounter (Signed)
See you on 07-13-2021 at 10

## 2021-07-01 NOTE — ED Provider Notes (Signed)
[facility]  ____________________________________________   Event Date/Time   First MD Initiated Contact with Patient 07/01/21 1221     (approximate)  I have reviewed the triage vital signs and the nursing notes.   HISTORY  Chief Complaint Weakness and Chest Pain    HPI Angela Horton is a 80 y.o. female with past medical history of hypertension, hyperlipidemia, diabetes and CKD who presents with chest pain and lightheadedness.  Patient notes that she woke up around 11 PM last night to use the bathroom when she felt somewhat lightheaded and shaky.  She also felt sensation of like things were moving in the center of her chest/upper abdomen.  She had no associated nausea vomiting or shortness of breath.  She was able to go back to sleep.  When she woke up this morning she felt similar symptoms which have since resolved.  She had an ED visit 5 days ago for dizziness and was ultimately diagnosed with hypokalemia.  CT that time did not show any acute stroke but did show an old lacunar stroke.  Patient denies any symptoms of vertigo or other neurologic symptoms.         Past Medical History:  Diagnosis Date   Diabetes mellitus without complication ([place])    Gout    Hyperlipidemia    Hypertension    Obesity     Patient Active Problem List   Diagnosis Date Noted   CKD (chronic kidney disease) stage 2, GFR 60-89 ml/min 07/26/2018   Premature atrial contractions 10/12/2015   Essential hypertension 01/07/2015   Hyperlipidemia 01/07/2015   Type 2 diabetes mellitus with other specified complication, without long-term current use of insulin ([place])     Past Surgical History:  Procedure Laterality Date   ABDOMINAL HYSTERECTOMY      Prior to Admission medications   Medication Sig Start Date End Date Taking? Authorizing Provider  amLODipine (NORVASC) 10 MG tablet TAKE 1 TABLET BY MOUTH EVERY DAY 03/09/21   Myles Gip, DO  brimonidine (ALPHAGAN) 0.2 %  ophthalmic solution Place 1 drop into both eyes 2 (two) times daily. 01/01/15   [provider]  Omega-3 Fatty Acids (FISH OIL) 1000 MG CAPS Take 1 capsule by mouth daily.    [provider]  rosuvastatin (CRESTOR) 20 MG tablet TAKE 1 TABLET BY MOUTH EVERYDAY AT BEDTIME 02/14/21   Delsa Grana, PA-C  timolol (TIMOPTIC) 0.5 % ophthalmic solution INSTILL 1 DROP INTO BOTH EYES EVERY MORNING 09/01/15   [provider]  valsartan (DIOVAN) 40 MG tablet Take 1 tablet (40 mg total) by mouth daily. 06/10/21 07/10/21  Bo Merino, FNP    Allergies Metformin and related  Family History  Problem Relation Age of Onset   Kidney disease Mother    Kidney disease Sister    Stroke Brother    Heart disease Sister    Stroke Sister     Social History Social History   Tobacco Use   Smoking status: Never   Smokeless tobacco: Never   Tobacco comments:    Smoking cessation materials not required  Vaping Use   Vaping Use: Never used  Substance Use Topics   Alcohol use: No    Alcohol/week: 0.0 standard drinks   Drug use: No    Review of Systems   Review of Systems  Constitutional:  Negative for chills and fever.  Respiratory:  Negative for shortness of breath.   Cardiovascular:  Positive for chest pain.  Gastrointestinal:  Positive  for abdominal pain. Negative for diarrhea, nausea and vomiting.  Neurological:  Positive for light-headedness.  All other systems reviewed and are negative.  Physical Exam Updated Vital Signs BP 118/70 (BP Location: Left Arm)    Pulse 88    Temp 98.5 F (36.9 C) (Oral)    Resp 18    Ht 5\' 8"  (1.727 m)    Wt 73.5 kg    SpO2 100%    BMI 24.63 kg/m   Physical Exam Vitals and nursing note reviewed.  Constitutional:      General: She is not in acute distress.    Appearance: Normal appearance.  HENT:     Head: Normocephalic and atraumatic.  Eyes:     General: No scleral icterus.    Conjunctiva/sclera: Conjunctivae normal.  Cardiovascular:      Rate and Rhythm: Normal rate and regular rhythm.  Pulmonary:     Effort: Pulmonary effort is normal. No respiratory distress.     Breath sounds: No stridor. No wheezing.  Abdominal:     Palpations: Abdomen is soft. There is no mass.     Tenderness: There is no abdominal tenderness. There is no guarding.  Musculoskeletal:        General: No deformity or signs of injury.     Cervical back: Normal range of motion.     Right lower leg: No edema.     Left lower leg: No edema.  Skin:    General: Skin is dry.     Coloration: Skin is not jaundiced or pale.  Neurological:     General: No focal deficit present.     Mental Status: She is alert and oriented to person, place, and time. Mental status is at baseline.  Psychiatric:        Mood and Affect: Mood normal.        Behavior: Behavior normal.     LABS (all labs ordered are listed, but only abnormal results are displayed)  Labs Reviewed  BASIC METABOLIC PANEL - Abnormal; Notable for the following components:      Result Value   Sodium 133 (*)    Glucose, Bld 144 (*)    Calcium 8.8 (*)    GFR, Estimated 60 (*)    All other components within normal limits  CBC - Abnormal; Notable for the following components:   WBC 2.9 (*)    RBC 3.85 (*)    Hemoglobin 10.8 (*)    HCT 32.9 (*)    RDW 15.8 (*)    All other components within normal limits  URINALYSIS, ROUTINE W REFLEX MICROSCOPIC - Abnormal; Notable for the following components:   Color, Urine YELLOW (*)    APPearance CLEAR (*)    All other components within normal limits  TROPONIN I (HIGH SENSITIVITY)  TROPONIN I (HIGH SENSITIVITY)   ____________________________________________  EKG  NSR, nml axis, nml intervals, no acute ischemic changes  ____________________________________________  RADIOLOGY Almeta Monas, personally viewed and evaluated these images (plain radiographs) as part of my medical decision making, as well as reviewing the written report by the  radiologist.  ED MD interpretation:  I reviewed the CXR which does not show any acute cardiopulmonary process      ____________________________________________   PROCEDURES  Procedure(s) performed (including Critical Care):  Procedures   ____________________________________________   INITIAL IMPRESSION / ASSESSMENT AND PLAN / ED COURSE     Is an 80 year old female presenting with chest discomfort and lightheadedness that started last night when she  woke from sleep to use the bathroom.  Vital signs within normal limits.  Patient has not had symptoms for several hours at this point.  She describes feeling lightheaded and shaky and denies vertigo or any neurologic symptoms.  She had a vague sensation like something was moving in the center of her chest but denies frank chest pain and did not have any other associated anginal symptoms including shortness of breath nausea or diaphoresis.  Currently she is asymptomatic.  Her EKG is nonischemic.  Her troponin is negative rest of her lab work is reassuring.  She did have a presentation 5 days ago for dizziness and at that time had a CT head that was negative and labs that showed mild hypokalemia.  Overall unclear what the cause of her symptoms was, suspicion for ACS is low given her reassuring work-up here and atypical nature of her symptoms.  Advised that she follow-up with her PCP.       ____________________________________________   FINAL CLINICAL IMPRESSION(S) / ED DIAGNOSES  Final diagnoses:  Lightheadedness  Chest pain, unspecified type     ED Discharge Orders     None        Note:  This document was prepared using Dragon voice recognition software and may include unintentional dictation errors.    Rada Hay, MD 07/01/21 Dorthula Perfect

## 2021-07-01 NOTE — ED Triage Notes (Signed)
Patient to ER from home for c/o generalized weakness and chest pain that patient describes as sharp in nature. States s/s symptoms started last night at approx 2300. +Mild shortness of breath.

## 2021-07-03 ENCOUNTER — Other Ambulatory Visit: Payer: Self-pay | Admitting: Nurse Practitioner

## 2021-07-03 DIAGNOSIS — I10 Essential (primary) hypertension: Secondary | ICD-10-CM

## 2021-07-05 NOTE — Telephone Encounter (Signed)
Requested Prescriptions  Pending Prescriptions Disp Refills   valsartan (DIOVAN) 40 MG tablet [Pharmacy Med Name: VALSARTAN 40 MG TABLET] 30 tablet 0    Sig: TAKE 1 TABLET BY MOUTH EVERY DAY     Cardiovascular:  Angiotensin Receptor Blockers Passed - 07/03/2021 11:35 AM      Passed - Cr in normal range and within 180 days    Creat  Date Value Ref Range Status  06/07/2021 1.40 (H) 0.60 - 0.95 mg/dL Final   Creatinine, Ser  Date Value Ref Range Status  07/01/2021 0.95 0.44 - 1.00 mg/dL Final   Creatinine, Urine  Date Value Ref Range Status  07/26/2018 207 20 - 275 mg/dL Final         Passed - K in normal range and within 180 days    Potassium  Date Value Ref Range Status  07/01/2021 3.8 3.5 - 5.1 mmol/L Final  06/15/2014 3.2 (L) 3.5 - 5.1 mmol/L Final         Passed - Patient is not pregnant      Passed - Last BP in normal range    BP Readings from Last 1 Encounters:  07/01/21 118/70         Passed - Valid encounter within last 6 months    Recent Outpatient Visits          4 weeks ago RUQ abdominal pain   [facility] Bo Merino, FNP   5 months ago Essential hypertension   [place], DO   11 months ago Essential hypertension   [facility] Delsa Grana, PA-C   1 year ago Essential hypertension   [facility] Delsa Grana, PA-C   2 years ago Type 2 diabetes mellitus without complication, without long-term current use of insulin [facility])   [place], [place]      Future Appointments            In 1 week Mecum, Dani Gobble, PA-C [facility], [place]   In 3 weeks Teodora Medici, [facility], [place]   In 6 months  [facility], [facility]

## 2021-07-13 ENCOUNTER — Ambulatory Visit (INDEPENDENT_AMBULATORY_CARE_PROVIDER_SITE_OTHER): Payer: Medicare HMO | Admitting: Physician Assistant

## 2021-07-13 ENCOUNTER — Encounter: Payer: Self-pay | Admitting: Physician Assistant

## 2021-07-13 VITALS — BP 104/62 | HR 75 | Temp 98.1°F | Resp 16 | Ht 68.0 in | Wt 133.3 lb

## 2021-07-13 DIAGNOSIS — I10 Essential (primary) hypertension: Secondary | ICD-10-CM

## 2021-07-13 DIAGNOSIS — E1169 Type 2 diabetes mellitus with other specified complication: Secondary | ICD-10-CM

## 2021-07-13 DIAGNOSIS — N182 Chronic kidney disease, stage 2 (mild): Secondary | ICD-10-CM

## 2021-07-13 DIAGNOSIS — R42 Dizziness and giddiness: Secondary | ICD-10-CM | POA: Diagnosis not present

## 2021-07-13 DIAGNOSIS — G3184 Mild cognitive impairment, so stated: Secondary | ICD-10-CM

## 2021-07-13 NOTE — Assessment & Plan Note (Signed)
CMP, CBC  ordered today as part of lightheadedness work up.  Results to dictate further management approach

## 2021-07-13 NOTE — Progress Notes (Signed)
Established Patient Office Visit  Subjective:  Patient ID: Angela Horton, female    DOB: 04-11-1941  Age: 81 y.o. MRN: [id number]  Introduced myself to the patient as a PA-C and provided education on APPs in clinical practice.    CC:  Chief Complaint  Patient presents with   Memory Loss    HPI FLORRIE Horton presents for recent ED visit for lightheadedness and dizziness She is here with her son She denies recent falls, confusion, symptoms of stroke, fatigue, weakness She lives with her husband - she states she manages her medications herself  She denies concerns for changes to her mood, concentration, memory Highest level of education - completed high school and attended college for a bit Patient started to close off after discussion of mild cognitive impairment.        Past Medical History:  Diagnosis Date   Diabetes mellitus without complication ([place])    Gout    Hyperlipidemia    Hypertension    Obesity     Past Surgical History:  Procedure Laterality Date   ABDOMINAL HYSTERECTOMY      Family History  Problem Relation Age of Onset   Kidney disease Mother    Kidney disease Sister    Stroke Brother    Heart disease Sister    Stroke Sister     Social History   Socioeconomic History   Marital status: Married    Spouse name: Not on file   Number of children: 4   Years of education: Not on file   Highest education level: Not on file  Occupational History   Not on file  Tobacco Use   Smoking status: Never   Smokeless tobacco: Never   Tobacco comments:    Smoking cessation materials not required  Vaping Use   Vaping Use: Never used  Substance and Sexual Activity   Alcohol use: No    Alcohol/week: 0.0 standard drinks   Drug use: No   Sexual activity: Yes  Other Topics Concern   Not on file  Social History Narrative   Not on file   Social Determinants of Health   Financial Resource Strain: Low Risk    Difficulty of Paying Living  Expenses: Not hard at all  Food Insecurity: No Food Insecurity   Worried About Charity fundraiser in the Last Year: Never true   Ran Out of Food in the Last Year: Never true  Transportation Needs: No Transportation Needs   Lack of Transportation (Medical): No   Lack of Transportation (Non-Medical): No  Physical Activity: Inactive   Days of Exercise per Week: 0 days   Minutes of Exercise per Session: 0 min  Stress: No Stress Concern Present   Feeling of Stress : Not at all  Social Connections: Moderately Integrated   Frequency of Communication with Friends and Family: More than three times a week   Frequency of Social Gatherings with Friends and Family: More than three times a week   Attends Religious Services: More than 4 times per year   Active Member of [employer] or Organizations: No   Attends Archivist Meetings: Never   Marital Status: Married  Human resources officer Violence: Not At Risk   Fear of Current or Ex-Partner: No   Emotionally Abused: No   Physically Abused: No   Sexually Abused: No    Outpatient Medications Prior to Visit  Medication Sig Dispense Refill   amLODipine (NORVASC) 10 MG tablet TAKE 1 TABLET BY MOUTH  EVERY DAY 90 tablet 3   brimonidine (ALPHAGAN) 0.2 % ophthalmic solution Place 1 drop into both eyes 2 (two) times daily.  3   Omega-3 Fatty Acids (FISH OIL) 1000 MG CAPS Take 1 capsule by mouth daily.     rosuvastatin (CRESTOR) 20 MG tablet TAKE 1 TABLET BY MOUTH EVERYDAY AT BEDTIME 90 tablet 3   timolol (TIMOPTIC) 0.5 % ophthalmic solution INSTILL 1 DROP INTO BOTH EYES EVERY MORNING  3   valsartan (DIOVAN) 40 MG tablet TAKE 1 TABLET BY MOUTH EVERY DAY 30 tablet 0   No facility-administered medications prior to visit.    Allergies  Allergen Reactions   Metformin And Related     Upset stomach headaches    ROS Review of Systems  Constitutional:  Negative for chills, fatigue and fever.  Respiratory:  Negative for shortness of breath.    Cardiovascular:  Negative for chest pain, palpitations and leg swelling.  Gastrointestinal:  Negative for diarrhea, nausea and vomiting.  Endocrine: Negative for polydipsia, polyphagia and polyuria.  Genitourinary:  Negative for dysuria, frequency and hematuria.  Neurological:  Negative for dizziness, tremors, weakness, light-headedness and headaches.  Psychiatric/Behavioral:  Negative for confusion, decreased concentration and sleep disturbance. The patient is not nervous/anxious.      Objective:    Physical Exam Constitutional:      Appearance: Normal appearance.  Neurological:     Mental Status: She is alert.  Psychiatric:        Attention and Perception: Attention normal.    BP 104/62    Pulse 75    Temp 98.1 F (36.7 C) (Oral)    Resp 16    Ht 5\' 8"  (1.727 m)    Wt 133 lb 4.8 oz (60.5 kg)    SpO2 97%    BMI 20.27 kg/m  Wt Readings from Last 3 Encounters:  07/13/21 133 lb 4.8 oz (60.5 kg)  07/01/21 162 lb (73.5 kg)  06/26/21 160 lb (72.6 kg)     Health Maintenance Due  Topic Date Due   Zoster Vaccines- Shingrix (1 of 2) Never done   OPHTHALMOLOGY EXAM  10/23/2018   COVID-19 Vaccine (4 - Booster for Pfizer series) 08/18/2020   FOOT EXAM  01/21/2021    There are no preventive care reminders to display for this patient.  Lab Results  Component Value Date   TSH 0.994 01/07/2015   Lab Results  Component Value Date   WBC 2.9 (L) 07/01/2021   HGB 10.8 (L) 07/01/2021   HCT 32.9 (L) 07/01/2021   MCV 85.5 07/01/2021   PLT 286 07/01/2021   Lab Results  Component Value Date   NA 133 (L) 07/01/2021   K 3.8 07/01/2021   CO2 23 07/01/2021   GLUCOSE 144 (H) 07/01/2021   BUN 11 07/01/2021   CREATININE 0.95 07/01/2021   BILITOT 0.8 06/26/2021   ALKPHOS 45 06/26/2021   AST 61 (H) 06/26/2021   ALT 21 06/26/2021   PROT 7.7 06/26/2021   ALBUMIN 3.6 06/26/2021   CALCIUM 8.8 (L) 07/01/2021   ANIONGAP 8 07/01/2021   EGFR 38 (L) 06/07/2021   Lab Results  Component  Value Date   CHOL 86 01/25/2021   Lab Results  Component Value Date   HDL 26 (L) 01/25/2021   Lab Results  Component Value Date   LDLCALC 45 01/25/2021   Lab Results  Component Value Date   TRIG 72 01/25/2021   Lab Results  Component Value Date   CHOLHDL 3.3 01/25/2021  Lab Results  Component Value Date   HGBA1C 7.1 (H) 01/25/2021      Assessment & Plan:   Problem List Items Addressed This Visit       Cardiovascular and Mediastinum   Essential hypertension (Chronic)    Appears to be normal range but low  Asking patient to record at home for 2 weeks to discuss at follow up to rule out potential hypotension         Endocrine   Type 2 diabetes mellitus with other specified complication, without long-term current use of insulin (HCC)    CMP, CBC and A1c ordered today for monitoring given recent lightheadedness episodes Discussed management strategies for hypoglycemia Recommend A1c goal of <8% at this time given age  Discussed diet and importance of maintaining stable glucose levels to avoid diabetes complications       Relevant Orders   Comprehensive Metabolic Panel (CMET)   HgB A1c     Nervous and Auditory   Mild cognitive impairment of uncertain or unknown etiology    Newly diagnosed- MMSE 22/30 indicating MCI  Provided patient and her son with information regarding MCI with plan to discuss management and monitoring at later time Patient seemed closed to discussing this today and denies difficulties concentrating, remembering things, or mood changes Recommend that management be centered on preserving cognitive function - exercise, diet and potential cognitive enhancers should be considered per patient and family preference         Genitourinary   CKD (chronic kidney disease) stage 2, GFR 60-89 ml/min (Chronic)    CMP, CBC  ordered today as part of lightheadedness work up.  Results to dictate further management approach      Relevant Orders    Comprehensive Metabolic Panel (CMET)   CBC w/Diff/Platelet     Other   Lightheadedness - Primary    Acute with recent evaluation in ED for lightheadedness.  ED visit on 12/ 25/2022 showed hypokalemia - CMP today to determine if Potassium levels are stabilizing  Discussed potential etiologies of lightheadedness with patient that include - postural hypotension, hypotension from HTN medications as her BP was low normal today, hypoglycemia, dehydration, frailty.  CMP, CBC, A1c for monitoring  Discussed taking BP records at home to determine continued need of hypertension regimen as it currently is  Discussed what to do if she becomes hypoglycemic  Discussed potential PT for fall prevention and strengthening - patient declined today but said she would consider it for the future.         No orders of the defined types were placed in this encounter.   Follow-up: No follow-ups on file.    Almon Register, PA-C [facility]

## 2021-07-13 NOTE — Assessment & Plan Note (Addendum)
Acute with recent evaluation in ED for lightheadedness.  ED visit on 12/ 25/2022 showed hypokalemia - CMP today to determine if Potassium levels are stabilizing  Discussed potential etiologies of lightheadedness with patient that include - postural hypotension, hypotension from HTN medications as her BP was low normal today, hypoglycemia, dehydration, frailty.  CMP, CBC, A1c for monitoring  Discussed taking BP records at home to determine continued need of hypertension regimen as it currently is  Discussed what to do if she becomes hypoglycemic  Discussed potential PT for fall prevention and strengthening - patient declined today but said she would consider it for the future.

## 2021-07-13 NOTE — Assessment & Plan Note (Addendum)
Appears to be normal range but low  Asking patient to record at home for 2 weeks to discuss at follow up to rule out potential hypotension

## 2021-07-13 NOTE — Patient Instructions (Addendum)
°  Today I would like to check your electrolytes to make sure they are stabilizing after your recent trip to the ER and help guide our management plan since your Potassium seems to be getting low periodically and may need supplementation to keep it at appropriate levels.   I would like you to check your Blood pressure at home - at least once per day for the next 2 weeks so we can make sure it is staying in an appropriate range  Please make sure you are staying well hydrated and eating regular meals several times per day as well as snacks to prevent a drop in blood sugar.  If you are feeling lightheaded again please make sure you are seated and safe from falling :  If possible, after that I would like you to take your blood pressure and record it, as well as drink some orange juice or a full sugar soda to help prevent your blood sugar from dropping too low  Please make sure to call EMS if the dizziness and lightheadedness persists/ becomes worse Go the ER if you lose consciousness, fall, feel increased weakness/ numbness, begin slurring your words, experience facial drooping   It was nice to meet you and I appreciate the opportunity to be involved in your care

## 2021-07-13 NOTE — Assessment & Plan Note (Signed)
Newly diagnosed- MMSE 22/30 indicating MCI  Provided patient and her son with information regarding MCI with plan to discuss management and monitoring at later time Patient seemed closed to discussing this today and denies difficulties concentrating, remembering things, or mood changes Recommend that management be centered on preserving cognitive function - exercise, diet and potential cognitive enhancers should be considered per patient and family preference

## 2021-07-13 NOTE — Assessment & Plan Note (Addendum)
CMP, CBC and A1c ordered today for monitoring given recent lightheadedness episodes Discussed management strategies for hypoglycemia Recommend A1c goal of <8% at this time given age  Discussed diet and importance of maintaining stable glucose levels to avoid diabetes complications

## 2021-07-14 LAB — CBC WITH DIFFERENTIAL/PLATELET
Absolute Monocytes: 407 cells/uL (ref 200–950)
Basophils Absolute: 11 cells/uL (ref 0–200)
Basophils Relative: 0.3 %
Eosinophils Absolute: 11 cells/uL — ABNORMAL LOW (ref 15–500)
Eosinophils Relative: 0.3 %
HCT: 33.3 % — ABNORMAL LOW (ref 35.0–45.0)
Hemoglobin: 10.9 g/dL — ABNORMAL LOW (ref 11.7–15.5)
Lymphs Abs: 1604 cells/uL (ref 850–3900)
MCH: 28.4 pg (ref 27.0–33.0)
MCHC: 32.7 g/dL (ref 32.0–36.0)
MCV: 86.7 fL (ref 80.0–100.0)
MPV: 12.7 fL — ABNORMAL HIGH (ref 7.5–12.5)
Monocytes Relative: 10.7 %
Neutro Abs: 1767 cells/uL (ref 1500–7800)
Neutrophils Relative %: 46.5 %
Platelets: 259 10*3/uL (ref 140–400)
RBC: 3.84 10*6/uL (ref 3.80–5.10)
RDW: 14.8 % (ref 11.0–15.0)
Total Lymphocyte: 42.2 %
WBC: 3.8 10*3/uL (ref 3.8–10.8)

## 2021-07-14 LAB — COMPREHENSIVE METABOLIC PANEL
AG Ratio: 1.2 (calc) (ref 1.0–2.5)
ALT: 18 U/L (ref 6–29)
AST: 49 U/L — ABNORMAL HIGH (ref 10–35)
Albumin: 3.8 g/dL (ref 3.6–5.1)
Alkaline phosphatase (APISO): 47 U/L (ref 37–153)
BUN: 9 mg/dL (ref 7–25)
CO2: 26 mmol/L (ref 20–32)
Calcium: 9.4 mg/dL (ref 8.6–10.4)
Chloride: 102 mmol/L (ref 98–110)
Creat: 0.88 mg/dL (ref 0.60–0.95)
Globulin: 3.1 g/dL (calc) (ref 1.9–3.7)
Glucose, Bld: 128 mg/dL — ABNORMAL HIGH (ref 65–99)
Potassium: 4 mmol/L (ref 3.5–5.3)
Sodium: 137 mmol/L (ref 135–146)
Total Bilirubin: 0.9 mg/dL (ref 0.2–1.2)
Total Protein: 6.9 g/dL (ref 6.1–8.1)

## 2021-07-14 LAB — HEMOGLOBIN A1C
Hgb A1c MFr Bld: 6.7 % of total Hgb — ABNORMAL HIGH (ref <5.7)
Mean Plasma Glucose: 146 mg/dL
eAG (mmol/L): 8.1 mmol/L

## 2021-07-28 ENCOUNTER — Encounter: Payer: Self-pay | Admitting: Internal Medicine

## 2021-07-28 ENCOUNTER — Ambulatory Visit: Payer: Self-pay

## 2021-07-28 ENCOUNTER — Ambulatory Visit (INDEPENDENT_AMBULATORY_CARE_PROVIDER_SITE_OTHER): Payer: Medicare HMO | Admitting: Internal Medicine

## 2021-07-28 ENCOUNTER — Ambulatory Visit: Payer: Medicare HMO | Admitting: Internal Medicine

## 2021-07-28 VITALS — BP 96/58 | HR 70 | Temp 98.0°F | Resp 16 | Ht 68.0 in | Wt 129.2 lb

## 2021-07-28 DIAGNOSIS — R531 Weakness: Secondary | ICD-10-CM | POA: Diagnosis not present

## 2021-07-28 DIAGNOSIS — I959 Hypotension, unspecified: Secondary | ICD-10-CM

## 2021-07-28 DIAGNOSIS — R42 Dizziness and giddiness: Secondary | ICD-10-CM | POA: Diagnosis not present

## 2021-07-28 NOTE — Progress Notes (Signed)
Acute Office Visit  Subjective:    Patient ID: Angela Horton, female    DOB: [birth year], 81 y.o.   MRN: [id number]  Chief Complaint  Patient presents with   Dizziness    With weakness started this am, she reports no other symptoms    HPI Patient is in today for feeling dizzy and weak. Chronic medical conditions include DM2, HTN, HLD, CKD. She went to the ED for this on 07/01/21 and was seen here for follow up on 07/13/21. A1c was 6.7, CBC and CMP virtually unremarkable. UA in the ED negative, trops negative. Chest x-ray and CT Head negative as well.   BP has been low - takes Amlodipine 10 mg, Valsartan 40 mg. Takes both at same time in the morning, hasn't taken them yet this morning.  Checks BP at home - 100/70 yesterday Lost about 20 pounds in the last several months - states appetite is fine but family says this is not the case, yesterday ate chicken, macaroni salad and greens for dinner, lunch soup, breakfast grits. No snacks. Hasn't eaten any breakfast or anything today yet.  DIZZINESS Duration: 1 month Description of symptoms: weak, good balance  Duration of episode: comes and goes, lasts 30 minutes Dizziness frequency:  happened before a few years ago, doesn't remember circumstances  Aggravating factors:   movement Triggered by rolling over in bed: no Triggered by bending over: no Aggravated by head movement: no Aggravated by exertion, coughing, loud noises: no Recent head injury: no Recent or current viral symptoms: no History of vasovagal episodes: no Nausea: no Vomiting: no Tinnitus: no Hearing loss: no Aural fullness: no Headache: yes, every morning, pain across forehead, goes away after taking Tylenol  Photophobia/phonophobia: no Unsteady gait: no Diaphoresis: no Dyspnea: no Chest pain: no   Past Medical History:  Diagnosis Date   Diabetes mellitus without complication ([place])    Gout    Hyperlipidemia    Hypertension    Obesity     Past Surgical  History:  Procedure Laterality Date   ABDOMINAL HYSTERECTOMY      Family History  Problem Relation Age of Onset   Kidney disease Mother    Kidney disease Sister    Stroke Brother    Heart disease Sister    Stroke Sister     Social History   Socioeconomic History   Marital status: Married    Spouse name: Not on file   Number of children: 4   Years of education: Not on file   Highest education level: Not on file  Occupational History   Not on file  Tobacco Use   Smoking status: Never   Smokeless tobacco: Never   Tobacco comments:    Smoking cessation materials not required  Vaping Use   Vaping Use: Never used  Substance and Sexual Activity   Alcohol use: No    Alcohol/week: 0.0 standard drinks   Drug use: No   Sexual activity: Yes  Other Topics Concern   Not on file  Social History Narrative   Not on file   Social Determinants of Health   Financial Resource Strain: Low Risk    Difficulty of Paying Living Expenses: Not hard at all  Food Insecurity: No Food Insecurity   Worried About Charity fundraiser in the Last Year: Never true   Arboriculturist in the Last Year: Never true  Transportation Needs: No Transportation Needs   Lack of Transportation (Medical): No   Lack of  Transportation (Non-Medical): No  Physical Activity: Inactive   Days of Exercise per Week: 0 days   Minutes of Exercise per Session: 0 min  Stress: No Stress Concern Present   Feeling of Stress : Not at all  Social Connections: Moderately Integrated   Frequency of Communication with Friends and Family: More than three times a week   Frequency of Social Gatherings with Friends and Family: More than three times a week   Attends Religious Services: More than 4 times per year   Active Member of [employer] or Organizations: No   Attends Archivist Meetings: Never   Marital Status: Married  Human resources officer Violence: Not At Risk   Fear of Current or Ex-Partner: No   Emotionally Abused: No    Physically Abused: No   Sexually Abused: No    Outpatient Medications Prior to Visit  Medication Sig Dispense Refill   amLODipine (NORVASC) 10 MG tablet TAKE 1 TABLET BY MOUTH EVERY DAY 90 tablet 3   brimonidine (ALPHAGAN) 0.2 % ophthalmic solution Place 1 drop into both eyes 2 (two) times daily.  3   Omega-3 Fatty Acids (FISH OIL) 1000 MG CAPS Take 1 capsule by mouth daily.     rosuvastatin (CRESTOR) 20 MG tablet TAKE 1 TABLET BY MOUTH EVERYDAY AT BEDTIME 90 tablet 3   timolol (TIMOPTIC) 0.5 % ophthalmic solution INSTILL 1 DROP INTO BOTH EYES EVERY MORNING  3   valsartan (DIOVAN) 40 MG tablet TAKE 1 TABLET BY MOUTH EVERY DAY 30 tablet 0   No facility-administered medications prior to visit.    Allergies  Allergen Reactions   Metformin And Related     Upset stomach headaches    Review of Systems  Constitutional:  Positive for activity change, appetite change, fatigue and unexpected weight change. Negative for chills, diaphoresis and fever.  Eyes:  Negative for visual disturbance.  Respiratory:  Negative for cough and shortness of breath.   Cardiovascular:  Negative for chest pain.  Gastrointestinal:  Negative for abdominal pain, nausea and vomiting.  Neurological:  Positive for dizziness, weakness, light-headedness and headaches.      Objective:    Physical Exam Constitutional:      Appearance: Normal appearance.  HENT:     Head: Normocephalic and atraumatic.     Mouth/Throat:     Mouth: Mucous membranes are moist.     Pharynx: Oropharynx is clear.  Eyes:     Extraocular Movements: Extraocular movements intact.     Conjunctiva/sclera: Conjunctivae normal.     Pupils: Pupils are equal, round, and reactive to light.  Cardiovascular:     Rate and Rhythm: Normal rate and regular rhythm.  Pulmonary:     Effort: Pulmonary effort is normal.     Breath sounds: Normal breath sounds.  Musculoskeletal:     Right lower leg: No edema.     Left lower leg: No edema.  Skin:     General: Skin is warm and dry.  Neurological:     General: No focal deficit present.     Mental Status: She is alert. Mental status is at baseline.  Psychiatric:        Mood and Affect: Mood normal.        Behavior: Behavior normal.    BP (!) 96/58    Pulse 70    Temp 98 F (36.7 C)    Resp 16    Ht $R'5\' 8"'FE$  (1.727 m)    Wt 129 lb 3.2 oz (58.6 kg)  BMI 19.64 kg/m  Wt Readings from Last 3 Encounters:  07/28/21 129 lb 3.2 oz (58.6 kg)  07/13/21 133 lb 4.8 oz (60.5 kg)  07/01/21 162 lb (73.5 kg)    Health Maintenance Due  Topic Date Due   Zoster Vaccines- Shingrix (1 of 2) Never done   OPHTHALMOLOGY EXAM  10/23/2018   COVID-19 Vaccine (4 - Booster for Pfizer series) 08/18/2020   FOOT EXAM  01/21/2021    There are no preventive care reminders to display for this patient.   Lab Results  Component Value Date   TSH 0.994 01/07/2015   Lab Results  Component Value Date   WBC 3.8 07/13/2021   HGB 10.9 (L) 07/13/2021   HCT 33.3 (L) 07/13/2021   MCV 86.7 07/13/2021   PLT 259 07/13/2021   Lab Results  Component Value Date   NA 137 07/13/2021   K 4.0 07/13/2021   CO2 26 07/13/2021   GLUCOSE 128 (H) 07/13/2021   BUN 9 07/13/2021   CREATININE 0.88 07/13/2021   BILITOT 0.9 07/13/2021   ALKPHOS 45 06/26/2021   AST 49 (H) 07/13/2021   ALT 18 07/13/2021   PROT 6.9 07/13/2021   ALBUMIN 3.6 06/26/2021   CALCIUM 9.4 07/13/2021   ANIONGAP 8 07/01/2021   EGFR 38 (L) 06/07/2021   Lab Results  Component Value Date   CHOL 86 01/25/2021   Lab Results  Component Value Date   HDL 26 (L) 01/25/2021   Lab Results  Component Value Date   LDLCALC 45 01/25/2021   Lab Results  Component Value Date   TRIG 72 01/25/2021   Lab Results  Component Value Date   CHOLHDL 3.3 01/25/2021   Lab Results  Component Value Date   HGBA1C 6.7 (H) 07/13/2021       Assessment & Plan:   1. Lightheadedness/Weakness/Hypotension, unspecified hypotension type: Reviewed ER note, office note,  imaging and labs. CBC showing chronic anemia, CMP virtually unremarkable, A1c 6.7. BP very low here, patient with symptoms of dizziness, hasn't eaten or taken medications yet. Given crackers and apple juice. Discontinue Amlodipine. Discussed eating 3 meals a day, focusing on protein intake and staying hydrated. Tried Ensure in the past and doesn't like them. Take Valsartan in the afternoon after eating and continue to check BP at home. Follow up here in 2 weeks for recheck. If dizziness/weakness persists with low blood pressures she will need to go to the ER for IV fluids and evaluation.    Teodora Medici, DO

## 2021-07-28 NOTE — Patient Instructions (Addendum)
It was great seeing you today!  Plan discussed at today's visit: -Discontinue Amlodipine  -Eat 3 meals a day focusing on protein intake. Eat breakfast in the morning, take Valsartan after breakfast, then check BP at lunch and write blood pressure down to bring to next appointment -Stay well hydrated and start a multivitamin -If weakness/dizziness persists and BP low (<100/60) at home, please present to the emergency department for evaluation   Follow up in: 2 weeks   Take care and let us know if you have any questions or concerns prior to your next visit.  Dr. Rosana Berger

## 2021-07-28 NOTE — Telephone Encounter (Signed)
°  Chief Complaint: medication question Symptoms: none Frequency: today Pertinent Negatives: NA Disposition: [] ED /[] Urgent Care (no appt availability in office) / [] Appointment(In office/virtual)/ []  Standard Virtual Care/ [x] Home Care/ [] Refused Recommended Disposition /[] [facility]/ []  Follow-up with PCP Additional Notes: Pt was advised that Dr. Rosana Berger told pt to quit taking amlodipine for BP d/t hypotension and lightheadedness  Summary: medication question   Patient called in asking about bp medication she was told to come off of from Dr Lucio Edward, but she doesn't remember the name of it. Please call back.       Reason for Disposition  Caller has medicine question only, adult not sick, AND triager answers question  Answer Assessment - Initial Assessment Questions 1. NAME of MEDICATION: "What medicine are you calling about?"     unsure 2. QUESTION: "What is your question?" (e.g., double dose of medicine, side effect)     Dr. Rosana Berger told her to quit taking a BP med this morning 3. PRESCRIBING HCP: "Who prescribed it?" Reason: if prescribed by specialist, call should be referred to that group.     Leisa  Protocols used: Medication Question Call-A-AH

## 2021-07-30 ENCOUNTER — Other Ambulatory Visit: Payer: Self-pay | Admitting: Family Medicine

## 2021-07-30 DIAGNOSIS — I10 Essential (primary) hypertension: Secondary | ICD-10-CM

## 2021-07-30 NOTE — Telephone Encounter (Signed)
Requested Prescriptions  Pending Prescriptions Disp Refills   valsartan (DIOVAN) 40 MG tablet [Pharmacy Med Name: VALSARTAN 40 MG TABLET] 30 tablet 0    Sig: TAKE 1 TABLET BY MOUTH EVERY DAY     Cardiovascular:  Angiotensin Receptor Blockers Passed - 07/30/2021  2:34 PM      Passed - Cr in normal range and within 180 days    Creat  Date Value Ref Range Status  07/13/2021 0.88 0.60 - 0.95 mg/dL Final   Creatinine, Urine  Date Value Ref Range Status  07/26/2018 207 20 - 275 mg/dL Final         Passed - K in normal range and within 180 days    Potassium  Date Value Ref Range Status  07/13/2021 4.0 3.5 - 5.3 mmol/L Final  06/15/2014 3.2 (L) 3.5 - 5.1 mmol/L Final         Passed - Patient is not pregnant      Passed - Last BP in normal range    BP Readings from Last 1 Encounters:  07/28/21 (!) 96/58         Passed - Valid encounter within last 6 months    Recent Outpatient Visits          2 days ago [place], DO   2 weeks ago [place], PA-C   1 month ago RUQ abdominal pain   [facility] Bo Merino, FNP   6 months ago Essential hypertension   [facility] Myles Gip, DO   1 year ago Essential hypertension   [facility] Delsa Grana, PA-C      Future Appointments            In 1 week Teodora Medici, [facility] [place]   In 5 months  [facility]

## 2021-08-02 ENCOUNTER — Ambulatory Visit: Payer: Medicare HMO | Admitting: Internal Medicine

## 2021-08-11 ENCOUNTER — Ambulatory Visit (INDEPENDENT_AMBULATORY_CARE_PROVIDER_SITE_OTHER): Payer: Medicare HMO | Admitting: Internal Medicine

## 2021-08-11 ENCOUNTER — Encounter: Payer: Self-pay | Admitting: Internal Medicine

## 2021-08-11 VITALS — BP 102/68 | HR 60 | Temp 98.2°F | Resp 16 | Ht 68.0 in | Wt 124.2 lb

## 2021-08-11 DIAGNOSIS — E46 Unspecified protein-calorie malnutrition: Secondary | ICD-10-CM | POA: Diagnosis not present

## 2021-08-11 DIAGNOSIS — R140 Abdominal distension (gaseous): Secondary | ICD-10-CM | POA: Diagnosis not present

## 2021-08-11 DIAGNOSIS — R634 Abnormal weight loss: Secondary | ICD-10-CM

## 2021-08-11 DIAGNOSIS — R42 Dizziness and giddiness: Secondary | ICD-10-CM

## 2021-08-11 DIAGNOSIS — I959 Hypotension, unspecified: Secondary | ICD-10-CM

## 2021-08-11 DIAGNOSIS — R630 Anorexia: Secondary | ICD-10-CM

## 2021-08-11 NOTE — Patient Instructions (Addendum)
It was great seeing you today!  Plan discussed at today's visit: -Start cutting blood pressure pill in half, take Valsartan 20 mg  -Continue to check blood pressure at home  -Abdominal ultrasound ordered   Follow up in: 1 month   Take care and let us know if you have any questions or concerns prior to your next visit.  Dr. Rosana Berger

## 2021-08-11 NOTE — Progress Notes (Signed)
Established Patient Office Visit  Subjective:  Patient ID: Angela Horton, female    DOB: Feb 04, 1941  Age: 81 y.o. MRN: [id number]  CC:  Chief Complaint  Patient presents with   Follow-up   Weight Loss    With appetite loss    Dizziness    HPI Angela Horton presents for follow up on lightheadedness and weakness. LOV 2 weeks ago. At that time her BP was 96/58. She normally takes Amlodipine 10 and Valsartan 40 for blood pressure but Amlodipine discontinued at last visit. Today she states that her symptoms are better but she does still feel weak in the mornings. Checking BP at home, usually about 108-122/80. Denies headache, changes in vision, chest pain, shortness of breath.  She has lost weight very quickly, lost about 30 pounds in about 6 weeks - states her appetite has decreased although she is eating 3 meals a day with fruit in between as a snack. According to her husband she is eating about 33% of her meals. She endorses epigastric abdominal pain and bloating after she eats. Tylenol helps the pain. It doesn't radiate and is only after she eats. She has a BM every other day, no blood in stools or dark stools, no constipation, no diarrhea, nausea/vomiting. No urinary symptoms, no fevers.   Past Medical History:  Diagnosis Date   Diabetes mellitus without complication (HCC)    Gout    Hyperlipidemia    Hypertension    Obesity     Past Surgical History:  Procedure Laterality Date   ABDOMINAL HYSTERECTOMY      Family History  Problem Relation Age of Onset   Kidney disease Mother    Kidney disease Sister    Stroke Brother    Heart disease Sister    Stroke Sister     Social History   Socioeconomic History   Marital status: Married    Spouse name: Not on file   Number of children: 4   Years of education: Not on file   Highest education level: Not on file  Occupational History   Not on file  Tobacco Use   Smoking status: Never   Smokeless tobacco: Never    Tobacco comments:    Smoking cessation materials not required  Vaping Use   Vaping Use: Never used  Substance and Sexual Activity   Alcohol use: No    Alcohol/week: 0.0 standard drinks   Drug use: No   Sexual activity: Yes  Other Topics Concern   Not on file  Social History Narrative   Not on file   Social Determinants of Health   Financial Resource Strain: Low Risk    Difficulty of Paying Living Expenses: Not hard at all  Food Insecurity: No Food Insecurity   Worried About Programme researcher, broadcasting/film/video in the Last Year: Never true   Ran Out of Food in the Last Year: Never true  Transportation Needs: No Transportation Needs   Lack of Transportation (Medical): No   Lack of Transportation (Non-Medical): No  Physical Activity: Inactive   Days of Exercise per Week: 0 days   Minutes of Exercise per Session: 0 min  Stress: No Stress Concern Present   Feeling of Stress : Not at all  Social Connections: Moderately Integrated   Frequency of Communication with Friends and Family: More than three times a week   Frequency of Social Gatherings with Friends and Family: More than three times a week   Attends Religious Services: More than 4  times per year   Active Member of Clubs or Organizations: No   Attends Archivist Meetings: Never   Marital Status: Married  Human resources officer Violence: Not At Risk   Fear of Current or Ex-Partner: No   Emotionally Abused: No   Physically Abused: No   Sexually Abused: No    Outpatient Medications Prior to Visit  Medication Sig Dispense Refill   brimonidine (ALPHAGAN) 0.2 % ophthalmic solution Place 1 drop into both eyes 2 (two) times daily.  3   Omega-3 Fatty Acids (FISH OIL) 1000 MG CAPS Take 1 capsule by mouth daily.     rosuvastatin (CRESTOR) 20 MG tablet TAKE 1 TABLET BY MOUTH EVERYDAY AT BEDTIME 90 tablet 3   timolol (TIMOPTIC) 0.5 % ophthalmic solution INSTILL 1 DROP INTO BOTH EYES EVERY MORNING  3   valsartan (DIOVAN) 40 MG tablet TAKE 1  TABLET BY MOUTH EVERY DAY 30 tablet 0   No facility-administered medications prior to visit.    Allergies  Allergen Reactions   Metformin And Related     Upset stomach headaches    ROS Review of Systems  Constitutional:  Positive for activity change, appetite change and unexpected weight change. Negative for chills and fever.  Eyes:  Negative for visual disturbance.  Respiratory:  Negative for cough and shortness of breath.   Cardiovascular:  Negative for chest pain.  Gastrointestinal:  Positive for abdominal distention and abdominal pain. Negative for blood in stool, constipation, diarrhea, nausea and vomiting.  Genitourinary:  Negative for dysuria, frequency and hematuria.  Neurological:  Positive for weakness and light-headedness. Negative for dizziness and headaches.     Objective:    Physical Exam Constitutional:      Appearance: Normal appearance.  HENT:     Head: Normocephalic and atraumatic.  Eyes:     Conjunctiva/sclera: Conjunctivae normal.  Cardiovascular:     Rate and Rhythm: Normal rate and regular rhythm.  Pulmonary:     Effort: Pulmonary effort is normal.     Breath sounds: Normal breath sounds.  Abdominal:     General: Bowel sounds are normal. There is no distension.     Palpations: Abdomen is soft.     Tenderness: There is no abdominal tenderness. There is no right CVA tenderness, left CVA tenderness, guarding or rebound.  Musculoskeletal:     Right lower leg: No edema.     Left lower leg: No edema.  Skin:    General: Skin is warm and dry.  Neurological:     General: No focal deficit present.     Mental Status: She is alert. Mental status is at baseline.  Psychiatric:        Mood and Affect: Mood normal.        Behavior: Behavior normal.    BP 102/68    Pulse 60    Temp 98.2 F (36.8 C)    Resp 16    Ht $R'5\' 8"'fH$  (1.727 m)    Wt 124 lb 3.2 oz (56.3 kg)    SpO2 96%    BMI 18.88 kg/m  Wt Readings from Last 3 Encounters:  07/28/21 129 lb 3.2 oz (58.6  kg)  07/13/21 133 lb 4.8 oz (60.5 kg)  07/01/21 162 lb (73.5 kg)     Health Maintenance Due  Topic Date Due   Zoster Vaccines- Shingrix (1 of 2) Never done   OPHTHALMOLOGY EXAM  10/23/2018   COVID-19 Vaccine (4 - Booster for Pfizer series) 08/18/2020   FOOT EXAM  01/21/2021    There are no preventive care reminders to display for this patient.  Lab Results  Component Value Date   TSH 0.994 01/07/2015   Lab Results  Component Value Date   WBC 3.8 07/13/2021   HGB 10.9 (L) 07/13/2021   HCT 33.3 (L) 07/13/2021   MCV 86.7 07/13/2021   PLT 259 07/13/2021   Lab Results  Component Value Date   NA 137 07/13/2021   K 4.0 07/13/2021   CO2 26 07/13/2021   GLUCOSE 128 (H) 07/13/2021   BUN 9 07/13/2021   CREATININE 0.88 07/13/2021   BILITOT 0.9 07/13/2021   ALKPHOS 45 06/26/2021   AST 49 (H) 07/13/2021   ALT 18 07/13/2021   PROT 6.9 07/13/2021   ALBUMIN 3.6 06/26/2021   CALCIUM 9.4 07/13/2021   ANIONGAP 8 07/01/2021   EGFR 38 (L) 06/07/2021   Lab Results  Component Value Date   CHOL 86 01/25/2021   Lab Results  Component Value Date   HDL 26 (L) 01/25/2021   Lab Results  Component Value Date   LDLCALC 45 01/25/2021   Lab Results  Component Value Date   TRIG 72 01/25/2021   Lab Results  Component Value Date   CHOLHDL 3.3 01/25/2021   Lab Results  Component Value Date   HGBA1C 6.7 (H) 07/13/2021      Assessment & Plan:   1. Hypotension, unspecified hypotension type/Lightheadedness: Blood pressure better but still low. Decrease Valsartan to 20 mg daily, recheck in 1 month.  2. Abdominal bloating/Loss of appetite/Weight loss/Malnutrition, unspecified type [facility]): Weight loss of about 40 pounds, appetite decreased. Labs at last visit unremarkable other than chronic anemia. No colon cancer screening that I can see. CT to rule out malignancies including colon/ovarian.   - CT ABDOMEN PELVIS W WO CONTRAST; Future    Follow-up: Return in about 4 weeks (around  09/08/2021).    Teodora Medici, DO

## 2021-08-24 ENCOUNTER — Other Ambulatory Visit: Payer: Self-pay | Admitting: Family Medicine

## 2021-08-24 DIAGNOSIS — I10 Essential (primary) hypertension: Secondary | ICD-10-CM

## 2021-08-25 ENCOUNTER — Ambulatory Visit
Admission: RE | Admit: 2021-08-25 | Discharge: 2021-08-25 | Disposition: A | Discharge status: Home / Self Care | Payer: Medicare HMO | Source: Ambulatory Visit | Attending: Internal Medicine | Admitting: Internal Medicine

## 2021-08-25 DIAGNOSIS — I700 Atherosclerosis of aorta: Secondary | ICD-10-CM | POA: Diagnosis not present

## 2021-08-25 DIAGNOSIS — R630 Anorexia: Secondary | ICD-10-CM | POA: Insufficient documentation

## 2021-08-25 DIAGNOSIS — R634 Abnormal weight loss: Secondary | ICD-10-CM | POA: Insufficient documentation

## 2021-08-25 DIAGNOSIS — R140 Abdominal distension (gaseous): Secondary | ICD-10-CM | POA: Insufficient documentation

## 2021-08-25 DIAGNOSIS — R109 Unspecified abdominal pain: Secondary | ICD-10-CM | POA: Diagnosis not present

## 2021-08-25 DIAGNOSIS — E46 Unspecified protein-calorie malnutrition: Secondary | ICD-10-CM | POA: Insufficient documentation

## 2021-08-25 DIAGNOSIS — K760 Fatty (change of) liver, not elsewhere classified: Secondary | ICD-10-CM | POA: Diagnosis not present

## 2021-08-25 LAB — POCT I-STAT CREATININE: Creatinine, Ser: 1.2 mg/dL — ABNORMAL HIGH (ref 0.44–1.00)

## 2021-08-25 IMAGING — CT CT ABD-PELV W/ CM
2 of 5 series · 14 of 46 positions shown, 16 images · IV contrast (APPLIED)
Comparison: None available.

CLINICAL DATA: Abdominal bloating. Loss of appetite. Unintentional
weight loss. Malnutrition. Epigastric pain.

EXAM:
CT ABDOMEN AND PELVIS WITH CONTRAST
TECHNIQUE: Multidetector CT imaging of the abdomen and pelvis was performed
using the standard protocol following bolus administration of
intravenous contrast.

[Series 2: routine abd/pel with · axial · 0.74mm/px · z∈[-627,-217]mm · 11 of 92 slices shown, 13 images]
[im 5/92  soft-tissue]
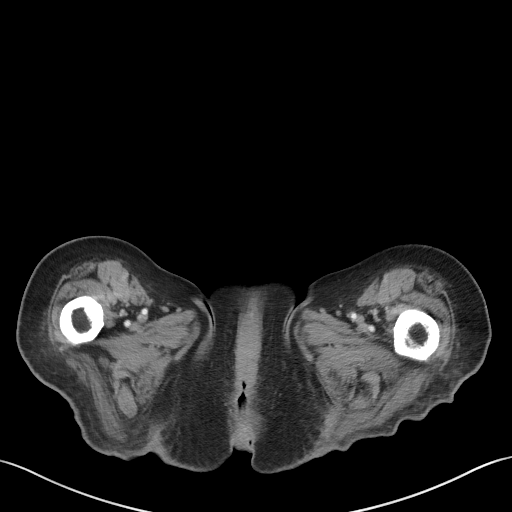
[im 5/92  bone]
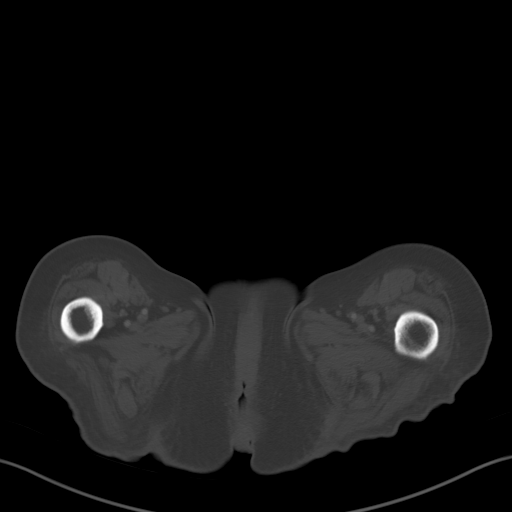
[im 15/92  soft-tissue]
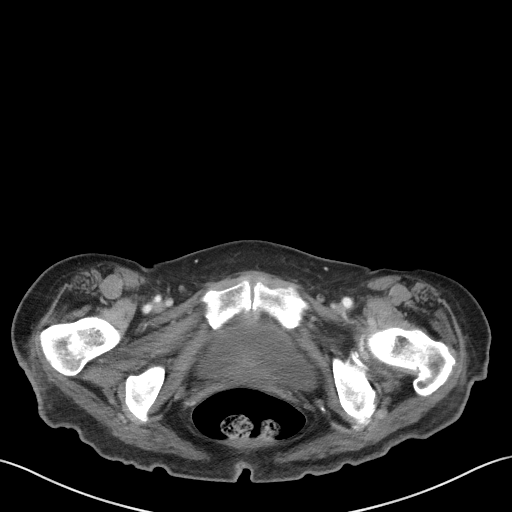
[im 24/92  soft-tissue]
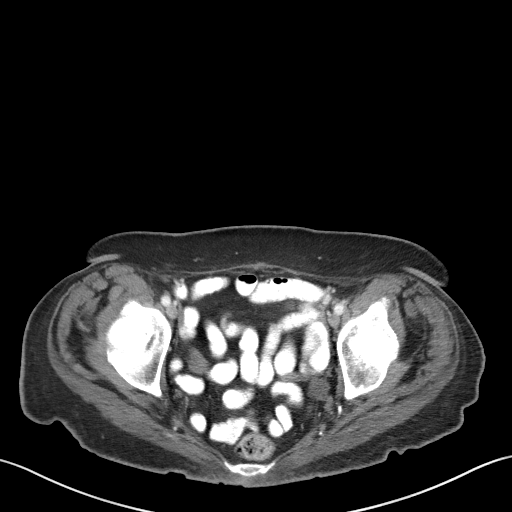
[im 29/92  soft-tissue]
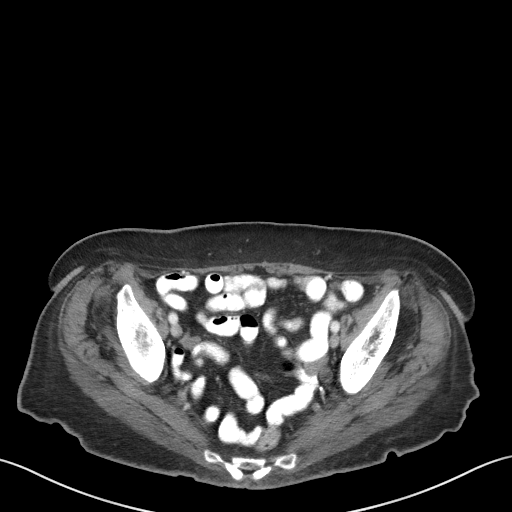
[im 39/92  soft-tissue]
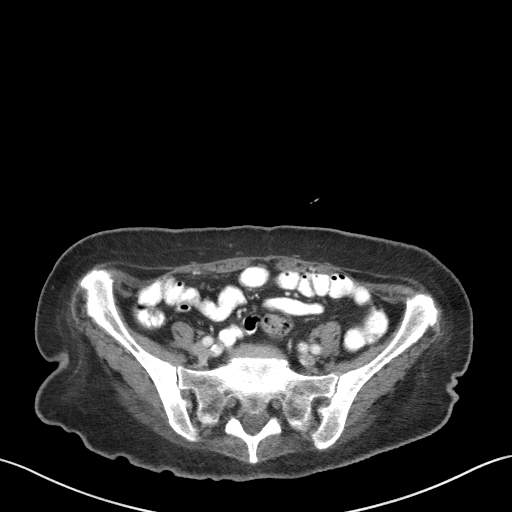
[im 48/92  soft-tissue]
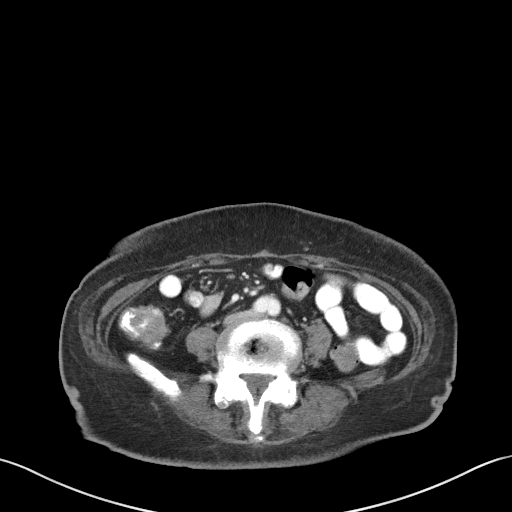
[im 53/92  soft-tissue]
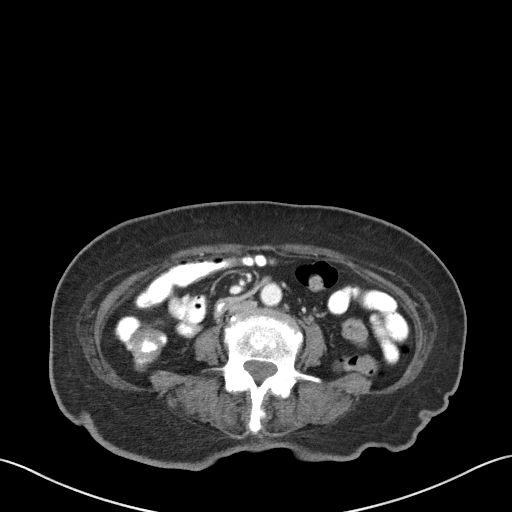
[im 63/92  soft-tissue]
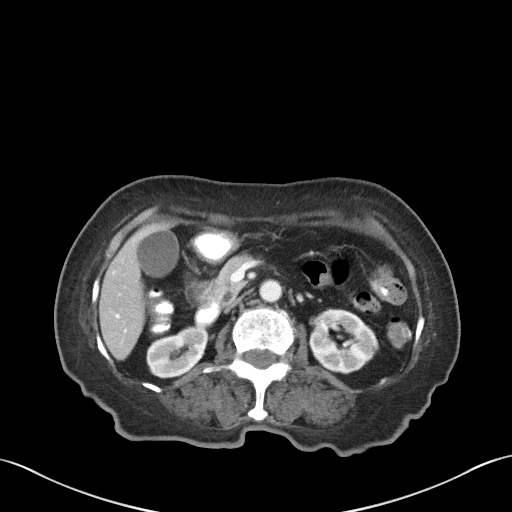
[im 68/92  soft-tissue]
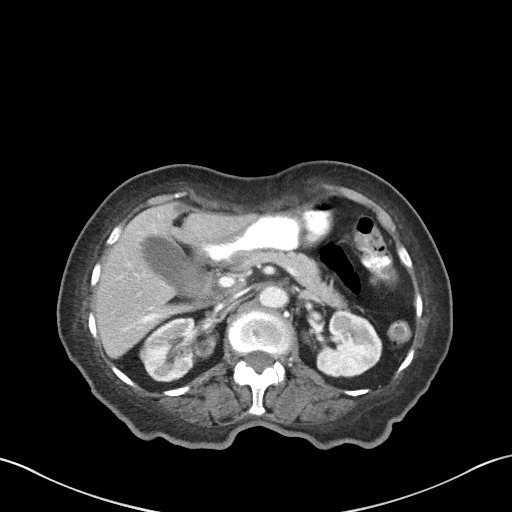
[im 68/92  bone]
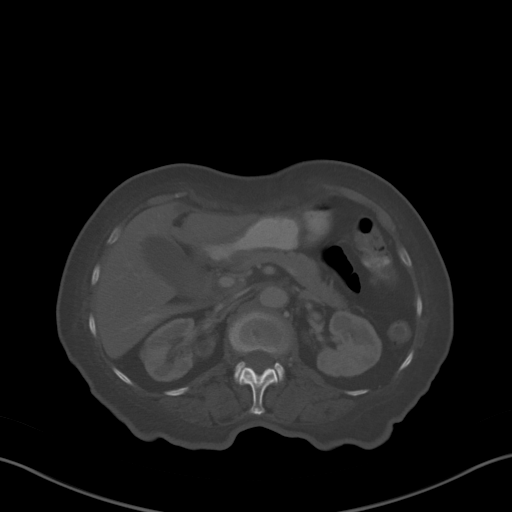
[im 77/92  soft-tissue]
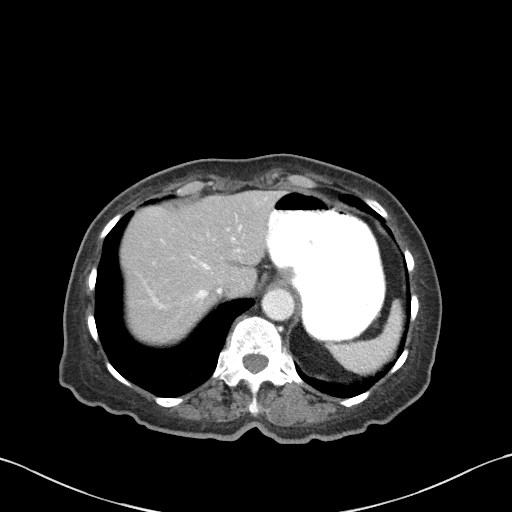
[im 87/92  soft-tissue]
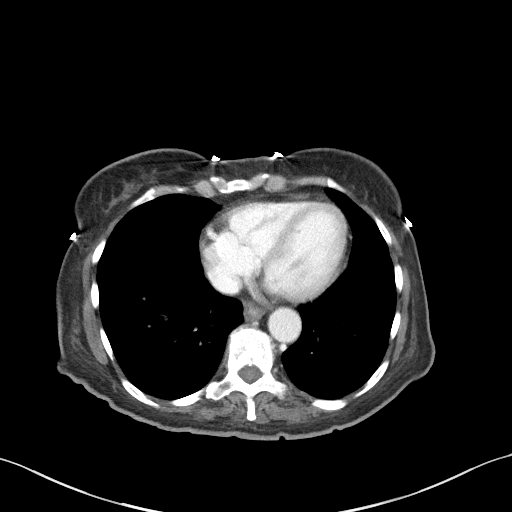

[Series 5: coronal st · coronal · 0.76mm/px · 3 of 89 slices shown]
[im 30/89  soft-tissue]
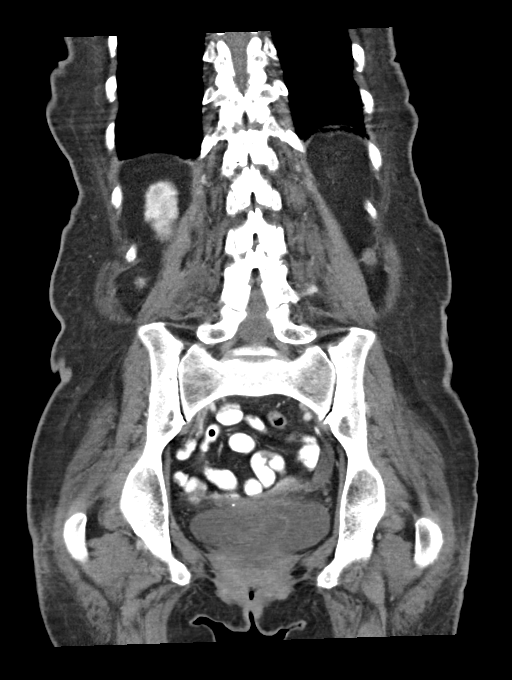
[im 40/89  soft-tissue]
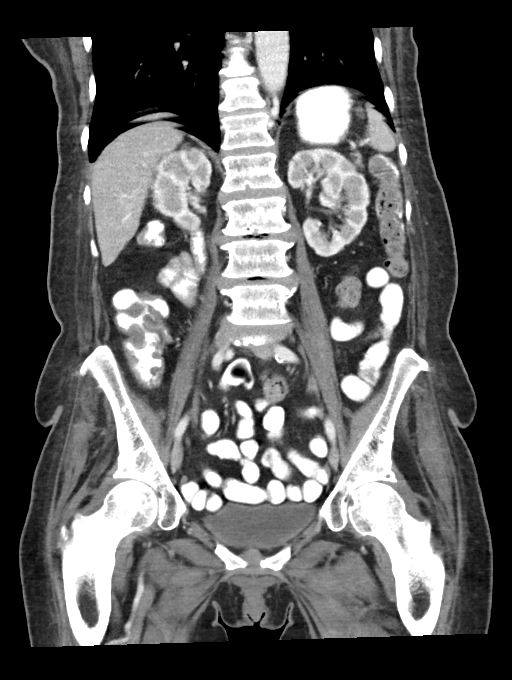
[im 49/89  soft-tissue]
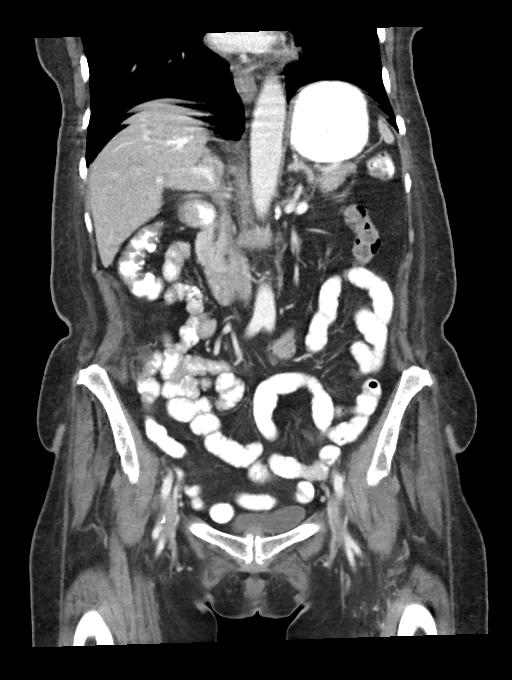

[14 of 46 positions shown; findings below may reference images not displayed]

RADIATION DOSE REDUCTION: This exam was performed according to the
departmental dose-optimization program which includes automated
exposure control, adjustment of the mA and/or kV according to
patient size and/or use of iterative reconstruction technique.

CONTRAST:  80mL OMNIPAQUE IOHEXOL 300 MG/ML  SOLN
FINDINGS: Lower chest: No basilar airspace disease or pleural effusion. Heart
is normal in size, there are coronary artery calcifications.

Hepatobiliary: Diffusely decreased hepatic density typical of
steatosis. Focal fatty infiltration adjacent to the falciform
ligament. No discrete liver lesion. Physiologically distended
gallbladder. There is mild central intrahepatic biliary ductal
dilatation. No common bile duct dilatation, CBD measures 6 mm. Mild
motion artifact through the liver limits detailed assessment. No
evidence of obstructing mass.

Pancreas: No ductal dilatation or inflammation.  No pancreatic mass.

Spleen: Normal in size without focal abnormality.

Adrenals/Urinary Tract: Mild adrenal thickening without dominant
adrenal nodule. Lobulated renal contours. No hydronephrosis. No
evidence of renal stone or focal renal lesion. Symmetric excretion
and homogeneous enhancement. Partially distended urinary bladder.
There is a 19 mm fluid density intravesicular lesion at the left
urinary trigone, probable ureterocele. There is mild distal left
ureteral dilatation. No bladder wall thickening. No evidence of
solid bladder lesion.

Stomach/Bowel: Wall thickening of the distal esophagus. There is
mild pre pyloric gastric wall thickening versus peristalsis. No
small bowel obstruction, inflammation, or wall thickening. Enteric
contrast reaches the colon. The appendix is normal. There is
redundancy of the descending colon. Small volume of colonic stool.
No colonic wall thickening, pericolonic edema, or evidence of
colonic mass.

Vascular/Lymphatic: Mild aortic and branch atherosclerosis. No
aortic aneurysm. Patent portal vein. There are no enlarged lymph
nodes in the abdomen or pelvis.

Reproductive: Hysterectomy. The ovaries are tentatively visualized
and quiescent. There is no suspicious adnexal mass.

Other: No ascites. No abdominopelvic collection. No evidence of
abdominopelvic mass. No abdominal wall hernia.

Musculoskeletal: Degenerative change throughout the lumbar spine,
both hips, in the pubic symphysis. Grade 1 anterolisthesis of L4 on
L5 likely facet mediated. No focal bone lesion or acute osseous
findings.
IMPRESSION: 1. Wall thickening of the distal esophagus, suspicious for
esophagitis or reflux. Mild pre pyloric gastric wall thickening
versus peristalsis. Finding could be further assessed with endoscopy
in the setting of weight loss and upper abdominal pain.
2. Mild central intrahepatic biliary ductal dilatation without
common bile duct dilatation. No evidence of obstructing mass.
Recommend correlation with LFTs. If elevated, consider further
evaluation with MRCP or ERCP.
3. Mild hepatic steatosis.
4. Left ureterocele, no renal obstruction.

Aortic Atherosclerosis ([8J]-[8J]).

## 2021-08-25 MED ORDER — IOHEXOL 300 MG/ML  SOLN
80.0000 mL | Freq: Once | INTRAMUSCULAR | Status: AC | PRN
  Administered 2021-08-25: 80 mL via INTRAVENOUS

## 2021-09-08 ENCOUNTER — Encounter: Payer: Self-pay | Admitting: Emergency Medicine

## 2021-09-08 ENCOUNTER — Observation Stay: Payer: Medicare HMO

## 2021-09-08 ENCOUNTER — Emergency Department: Payer: Medicare HMO

## 2021-09-08 ENCOUNTER — Other Ambulatory Visit: Payer: Self-pay

## 2021-09-08 ENCOUNTER — Ambulatory Visit: Payer: Medicare HMO | Admitting: Internal Medicine

## 2021-09-08 ENCOUNTER — Ambulatory Visit: Payer: Self-pay | Admitting: *Deleted

## 2021-09-08 ENCOUNTER — Inpatient Hospital Stay
Admission: EM | Admit: 2021-09-08 | Discharge: 2021-09-11 | DRG: 641 | Disposition: A | Discharge status: Home Health | Payer: Medicare HMO | Attending: Internal Medicine | Admitting: Internal Medicine

## 2021-09-08 DIAGNOSIS — I10 Essential (primary) hypertension: Secondary | ICD-10-CM | POA: Diagnosis not present

## 2021-09-08 DIAGNOSIS — W19XXXA Unspecified fall, initial encounter: Secondary | ICD-10-CM | POA: Diagnosis not present

## 2021-09-08 DIAGNOSIS — Z90710 Acquired absence of both cervix and uterus: Secondary | ICD-10-CM

## 2021-09-08 DIAGNOSIS — E440 Moderate protein-calorie malnutrition: Secondary | ICD-10-CM | POA: Diagnosis present

## 2021-09-08 DIAGNOSIS — Y92009 Unspecified place in unspecified non-institutional (private) residence as the place of occurrence of the external cause: Secondary | ICD-10-CM

## 2021-09-08 DIAGNOSIS — R748 Abnormal levels of other serum enzymes: Secondary | ICD-10-CM | POA: Diagnosis present

## 2021-09-08 DIAGNOSIS — R634 Abnormal weight loss: Secondary | ICD-10-CM | POA: Diagnosis present

## 2021-09-08 DIAGNOSIS — E785 Hyperlipidemia, unspecified: Secondary | ICD-10-CM | POA: Diagnosis present

## 2021-09-08 DIAGNOSIS — R42 Dizziness and giddiness: Secondary | ICD-10-CM | POA: Diagnosis not present

## 2021-09-08 DIAGNOSIS — M47812 Spondylosis without myelopathy or radiculopathy, cervical region: Secondary | ICD-10-CM | POA: Diagnosis not present

## 2021-09-08 DIAGNOSIS — I700 Atherosclerosis of aorta: Secondary | ICD-10-CM | POA: Diagnosis present

## 2021-09-08 DIAGNOSIS — I129 Hypertensive chronic kidney disease with stage 1 through stage 4 chronic kidney disease, or unspecified chronic kidney disease: Secondary | ICD-10-CM | POA: Diagnosis present

## 2021-09-08 DIAGNOSIS — K831 Obstruction of bile duct: Secondary | ICD-10-CM

## 2021-09-08 DIAGNOSIS — E876 Hypokalemia: Secondary | ICD-10-CM | POA: Diagnosis present

## 2021-09-08 DIAGNOSIS — Z79899 Other long term (current) drug therapy: Secondary | ICD-10-CM

## 2021-09-08 DIAGNOSIS — K838 Other specified diseases of biliary tract: Secondary | ICD-10-CM | POA: Diagnosis not present

## 2021-09-08 DIAGNOSIS — R531 Weakness: Principal | ICD-10-CM

## 2021-09-08 DIAGNOSIS — N1831 Chronic kidney disease, stage 3a: Secondary | ICD-10-CM | POA: Diagnosis not present

## 2021-09-08 DIAGNOSIS — Z043 Encounter for examination and observation following other accident: Secondary | ICD-10-CM | POA: Diagnosis not present

## 2021-09-08 DIAGNOSIS — Z8249 Family history of ischemic heart disease and other diseases of the circulatory system: Secondary | ICD-10-CM

## 2021-09-08 DIAGNOSIS — Z681 Body mass index (BMI) 19 or less, adult: Secondary | ICD-10-CM

## 2021-09-08 DIAGNOSIS — E1122 Type 2 diabetes mellitus with diabetic chronic kidney disease: Secondary | ICD-10-CM | POA: Diagnosis present

## 2021-09-08 DIAGNOSIS — M109 Gout, unspecified: Secondary | ICD-10-CM | POA: Diagnosis present

## 2021-09-08 DIAGNOSIS — R54 Age-related physical debility: Secondary | ICD-10-CM | POA: Diagnosis present

## 2021-09-08 DIAGNOSIS — Z20822 Contact with and (suspected) exposure to covid-19: Secondary | ICD-10-CM | POA: Diagnosis present

## 2021-09-08 DIAGNOSIS — R935 Abnormal findings on diagnostic imaging of other abdominal regions, including retroperitoneum: Secondary | ICD-10-CM | POA: Diagnosis not present

## 2021-09-08 DIAGNOSIS — K8689 Other specified diseases of pancreas: Secondary | ICD-10-CM | POA: Diagnosis not present

## 2021-09-08 DIAGNOSIS — K449 Diaphragmatic hernia without obstruction or gangrene: Secondary | ICD-10-CM | POA: Diagnosis present

## 2021-09-08 DIAGNOSIS — R7989 Other specified abnormal findings of blood chemistry: Secondary | ICD-10-CM | POA: Diagnosis not present

## 2021-09-08 DIAGNOSIS — E860 Dehydration: Principal | ICD-10-CM | POA: Diagnosis present

## 2021-09-08 DIAGNOSIS — K222 Esophageal obstruction: Secondary | ICD-10-CM | POA: Diagnosis present

## 2021-09-08 DIAGNOSIS — Z823 Family history of stroke: Secondary | ICD-10-CM

## 2021-09-08 LAB — CBC
HCT: 31.2 % — ABNORMAL LOW (ref 36.0–46.0)
Hemoglobin: 10.2 g/dL — ABNORMAL LOW (ref 12.0–15.0)
MCH: 28.6 pg (ref 26.0–34.0)
MCHC: 32.7 g/dL (ref 30.0–36.0)
MCV: 87.4 fL (ref 80.0–100.0)
Platelets: 278 10*3/uL (ref 150–400)
RBC: 3.57 MIL/uL — ABNORMAL LOW (ref 3.87–5.11)
RDW: 17.1 % — ABNORMAL HIGH (ref 11.5–15.5)
WBC: 5.1 10*3/uL (ref 4.0–10.5)
nRBC: 0 % (ref 0.0–0.2)

## 2021-09-08 LAB — BASIC METABOLIC PANEL
Anion gap: 15 (ref 5–15)
BUN: 23 mg/dL (ref 8–23)
CO2: 23 mmol/L (ref 22–32)
Calcium: 9.5 mg/dL (ref 8.9–10.3)
Chloride: 97 mmol/L — ABNORMAL LOW (ref 98–111)
Creatinine, Ser: 1.07 mg/dL — ABNORMAL HIGH (ref 0.44–1.00)
GFR, Estimated: 52 mL/min — ABNORMAL LOW (ref >60)
Glucose, Bld: 100 mg/dL — ABNORMAL HIGH (ref 70–99)
Potassium: 3 mmol/L — ABNORMAL LOW (ref 3.5–5.1)
Sodium: 135 mmol/L (ref 135–145)

## 2021-09-08 LAB — RESP PANEL BY RT-PCR (FLU A&B, COVID) ARPGX2
Influenza A by PCR: NEGATIVE
Influenza B by PCR: NEGATIVE
SARS Coronavirus 2 by RT PCR: NEGATIVE

## 2021-09-08 LAB — HEPATIC FUNCTION PANEL
ALT: 21 U/L (ref 0–44)
AST: 56 U/L — ABNORMAL HIGH (ref 15–41)
Albumin: 3.1 g/dL — ABNORMAL LOW (ref 3.5–5.0)
Alkaline Phosphatase: 31 U/L — ABNORMAL LOW (ref 38–126)
Bilirubin, Direct: 0.2 mg/dL (ref 0.0–0.2)
Indirect Bilirubin: 0.6 mg/dL (ref 0.3–0.9)
Total Bilirubin: 0.8 mg/dL (ref 0.3–1.2)
Total Protein: 6.7 g/dL (ref 6.5–8.1)

## 2021-09-08 LAB — MAGNESIUM: Magnesium: 1.7 mg/dL (ref 1.7–2.4)

## 2021-09-08 LAB — TRIGLYCERIDES: Triglycerides: 143 mg/dL (ref <150)

## 2021-09-08 LAB — LACTIC ACID, PLASMA: Lactic Acid, Venous: 1.4 mmol/L (ref 0.5–1.9)

## 2021-09-08 LAB — LIPASE, BLOOD: Lipase: 168 U/L — ABNORMAL HIGH (ref 11–51)

## 2021-09-08 IMAGING — CT CT CERVICAL SPINE W/O CM
3 of 4 series · 9 of 33 positions shown, 11 images · non-contrast
Comparison: CT head dated [DATE].

CLINICAL DATA: Dizziness and fall.



[Series 6: sagittal bone · sagittal · 0.22mm/px · 5 of 43 slices shown, 6 images]
[im 15/43  bone]
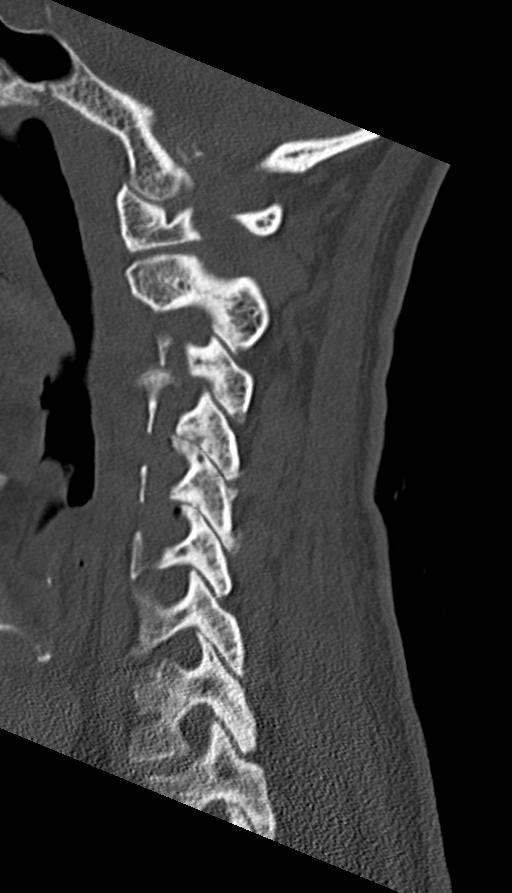
[im 18/43  bone]
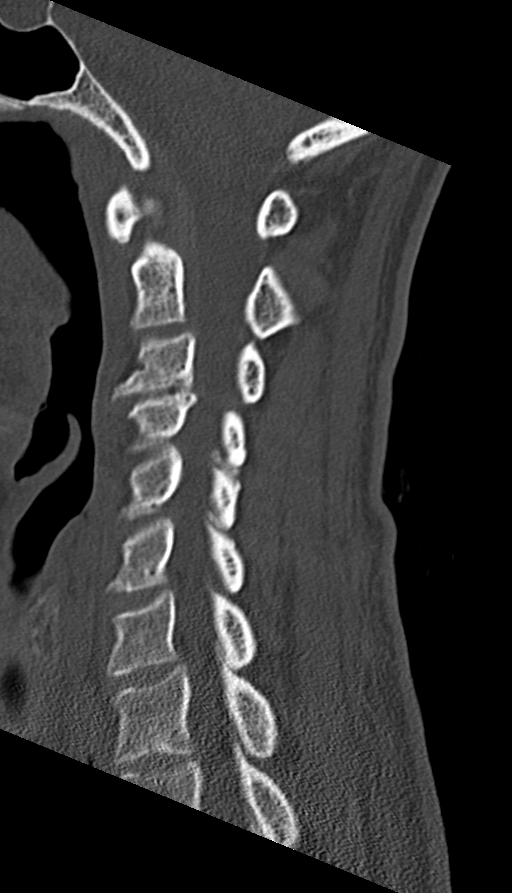
[im 22/43  soft-tissue]
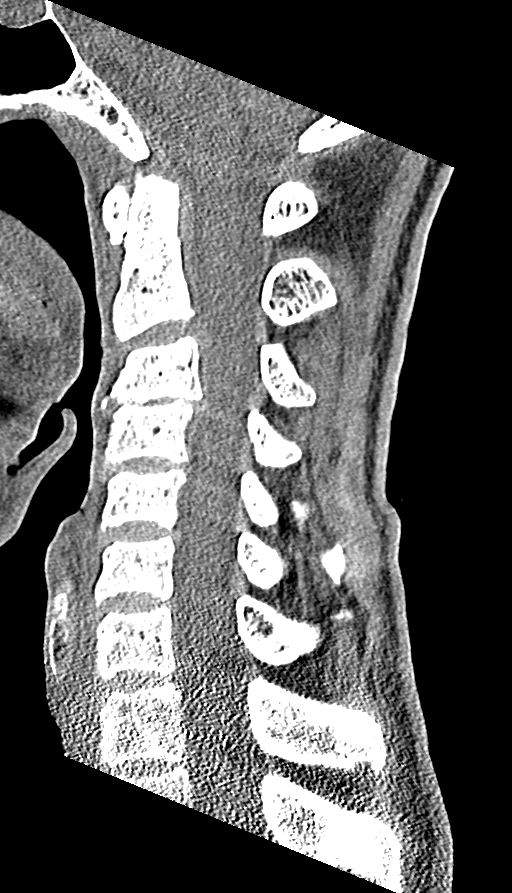
[im 22/43  bone]
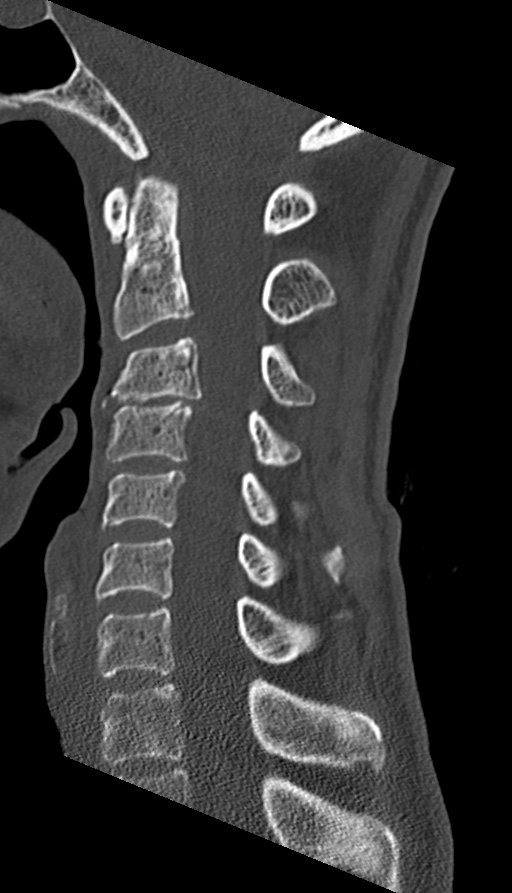
[im 25/43  bone]
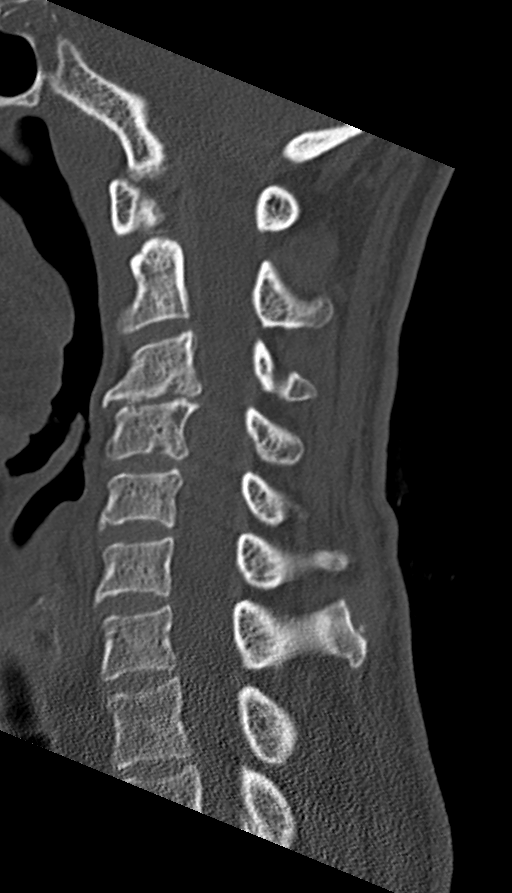
[im 29/43  bone]
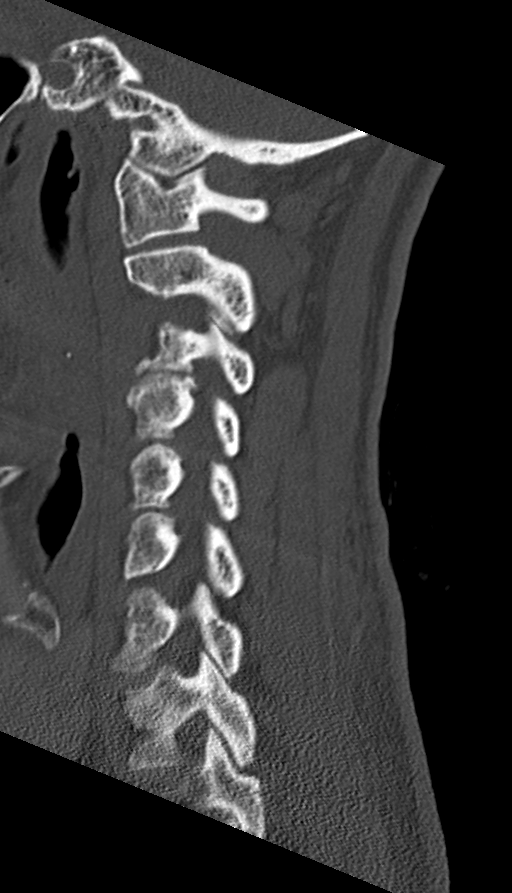

[Series 7: coronal bone · coronal · 0.18mm/px · 3 of 36 slices shown]
[im 8/36  bone]
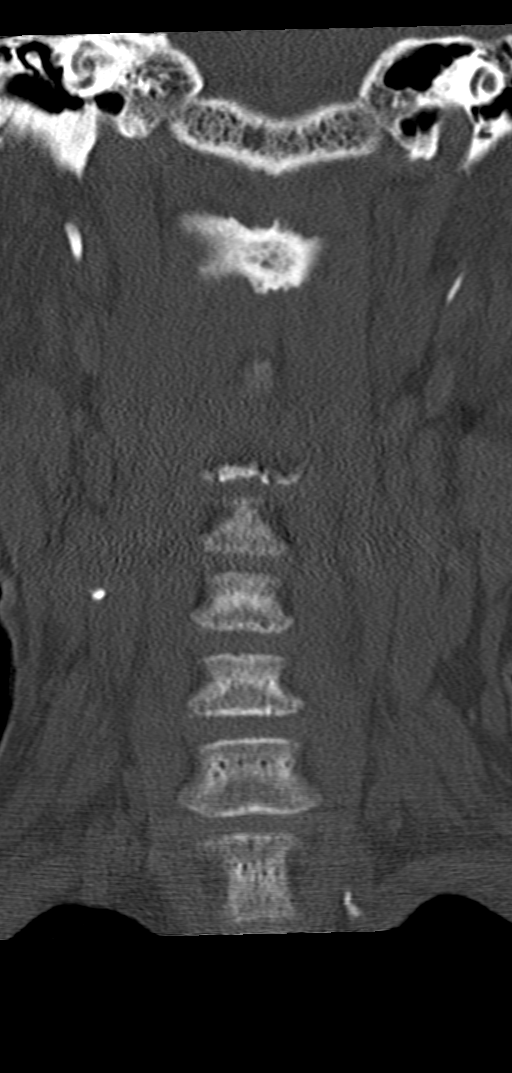
[im 15/36  bone]
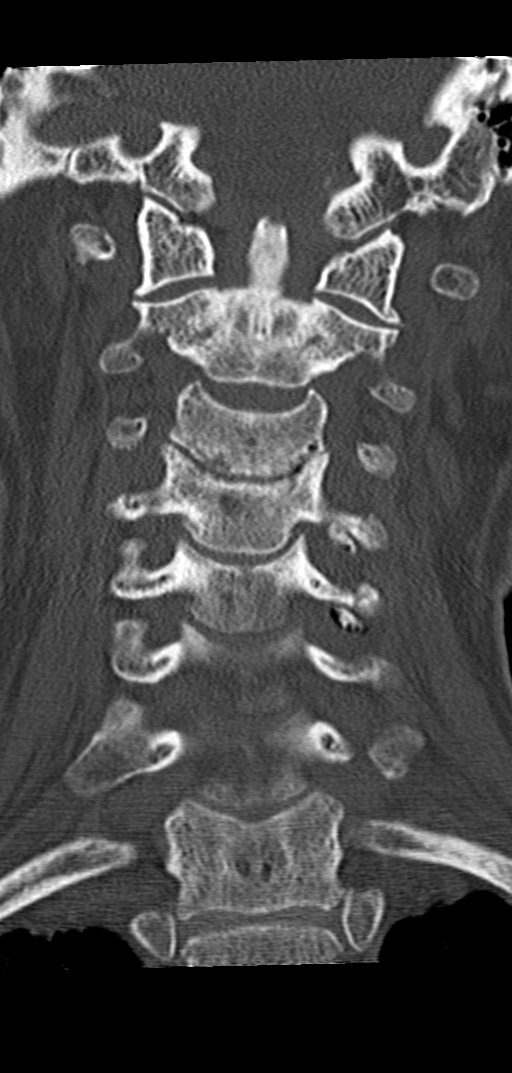
[im 22/36  bone]
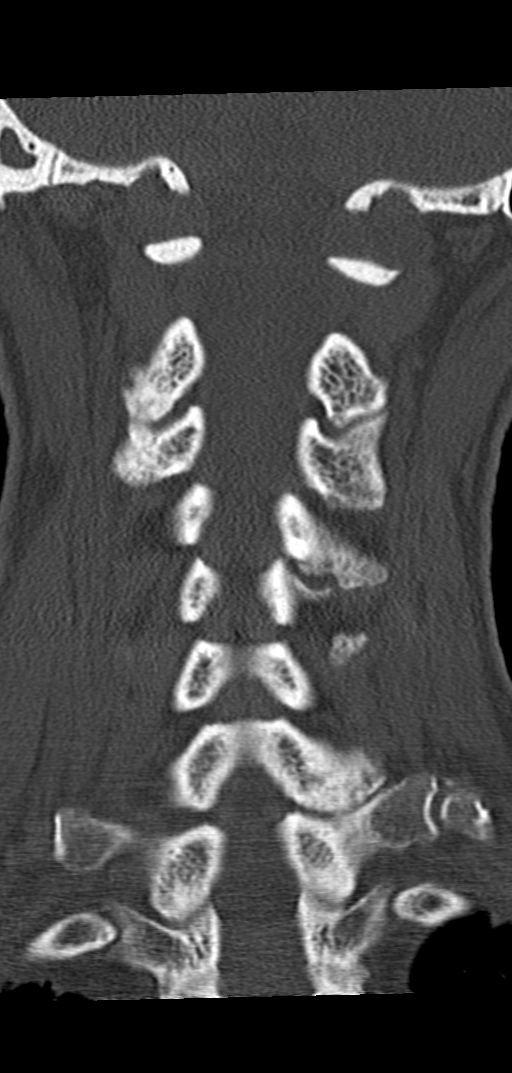

[Series 8: orthogonal bone · axial · 0.18mm/px · z∈[-159,-159]mm · 1 of 91 slices shown, 2 images]
[im 52/91  soft-tissue]
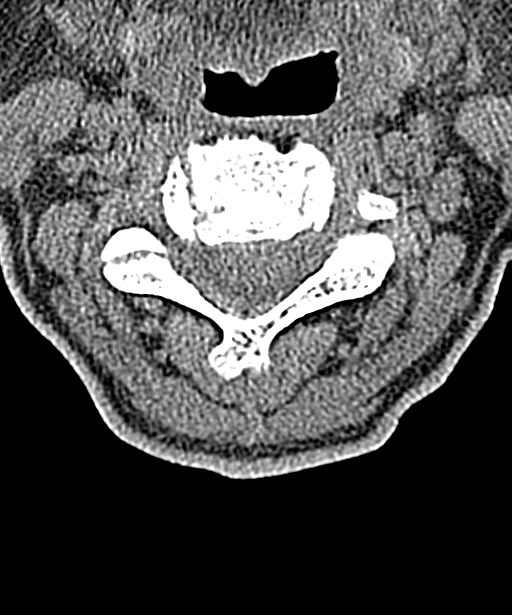
[im 52/91  bone]
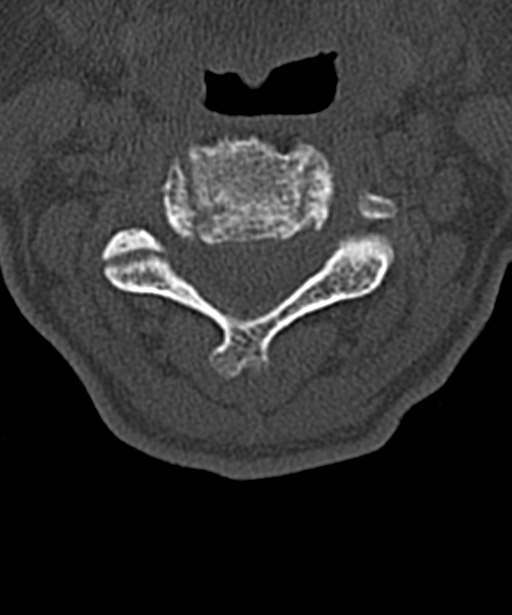

[9 of 33 positions shown; findings below may reference images not displayed]

FINDINGS: CT HEAD FINDINGS

Brain: No evidence of acute infarction, hemorrhage, hydrocephalus,
extra-axial collection or mass lesion/mass effect. Stable mild
atrophy and chronic microvascular ischemic changes. Old lacunar
infarct in the left thalamus again noted.

Vascular: Calcified atherosclerosis at the skull base. No hyperdense
vessel.

Skull: Negative for fracture or focal lesion.

Sinuses/Orbits: No acute finding. Unchanged partial opacification of
the right mastoid air cells.

Other: None.

CT CERVICAL SPINE FINDINGS

Alignment: No traumatic malalignment. Straightening of the normal
cervical lordosis with trace retrolisthesis at C3-C4.

Skull base and vertebrae: No acute fracture. No primary bone lesion
or focal pathologic process.

Soft tissues and spinal canal: No prevertebral fluid or swelling. No
visible canal hematoma.

Disc levels: Moderate to severe disc height loss and bilateral
uncovertebral hypertrophy at C3-C4. Scattered mild facet arthropathy
throughout the cervical spine, greater on the left.

Upper chest: Negative.

Other: None.
IMPRESSION: 1. No acute intracranial abnormality. Stable mild atrophy and
chronic microvascular ischemic changes.
2. No acute cervical spine fracture or traumatic listhesis.
3. Moderate to severe cervical spondylosis at C3-C4.

## 2021-09-08 IMAGING — CT CT HEAD W/O CM
4 series · 14 of 47 positions shown, 16 images · non-contrast
Comparison: CT head dated [DATE].

CLINICAL DATA: Dizziness and fall.



[Series 3: ax head wo · axial · 0.35mm/px · z∈[-107,+2]mm · 7 of 30 slices shown, 9 images]
[im 4/30  brain]
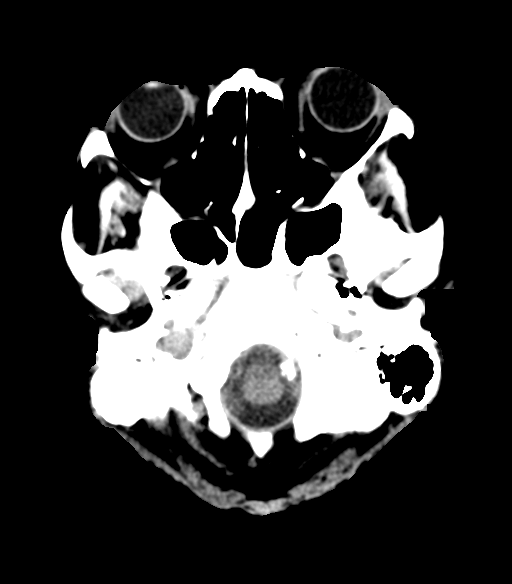
[im 4/30  bone]
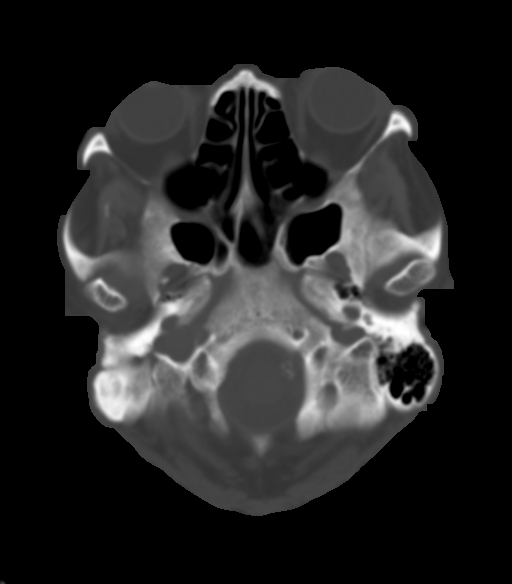
[im 8/30  brain]
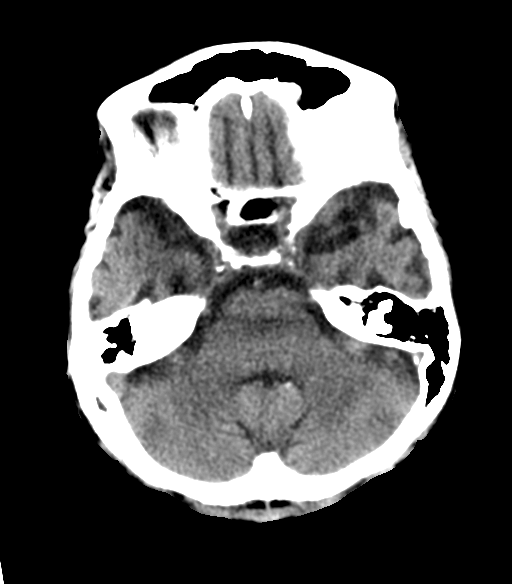
[im 11/30  brain]
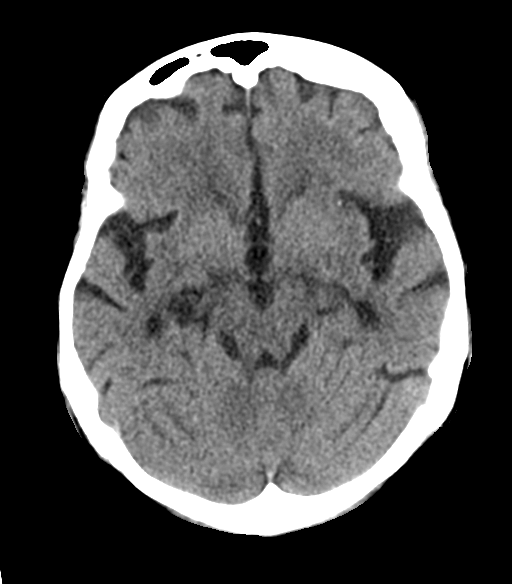
[im 15/30  brain]
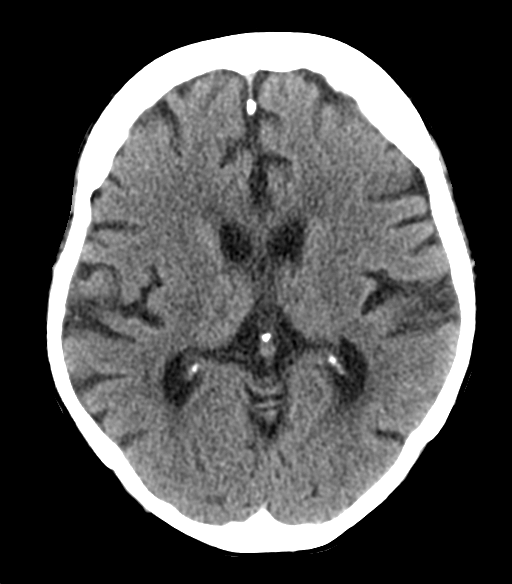
[im 19/30  brain]
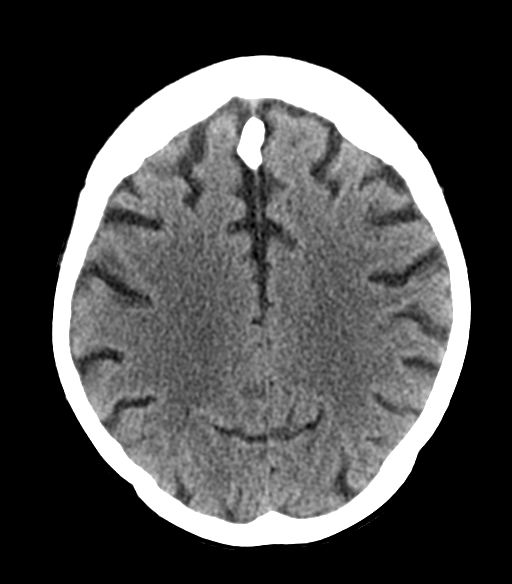
[im 19/30  bone]
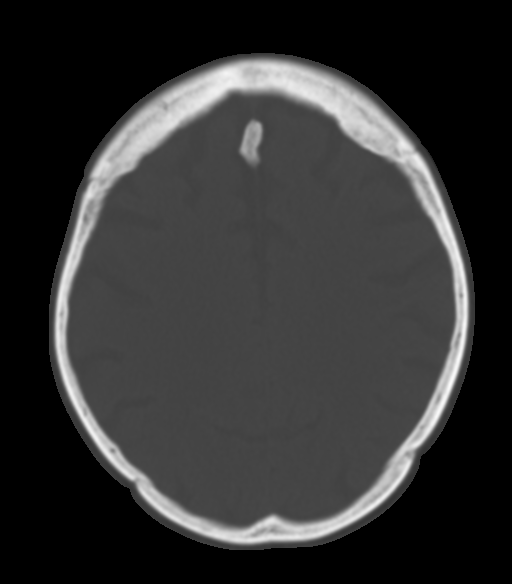
[im 22/30  brain]
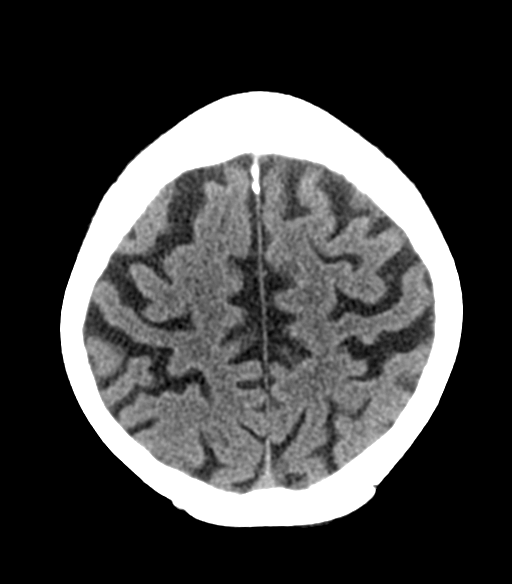
[im 26/30  brain]
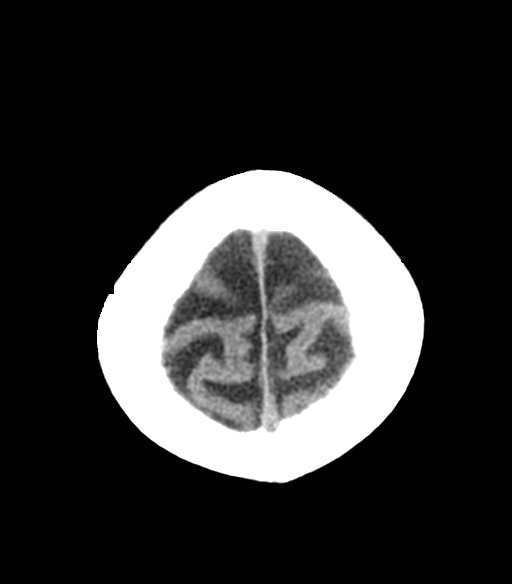

[Series 4: ax head bone · axial · 0.35mm/px · 1 of 74 slices shown]
[im 8/74  bone]
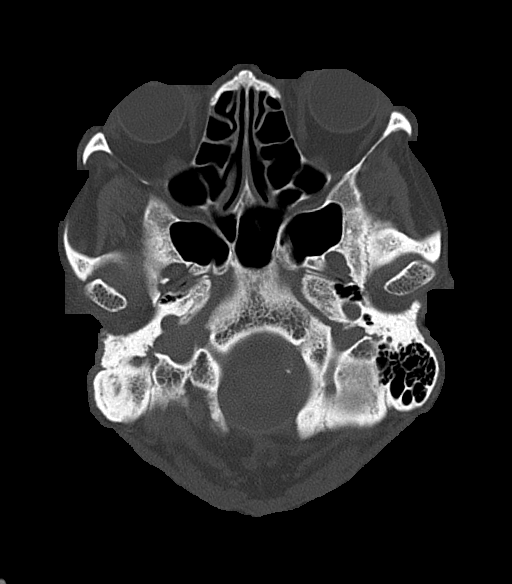

[Series 5: coronal soft tissue · coronal · 0.31mm/px · 3 of 62 slices shown]
[im 21/62  brain]
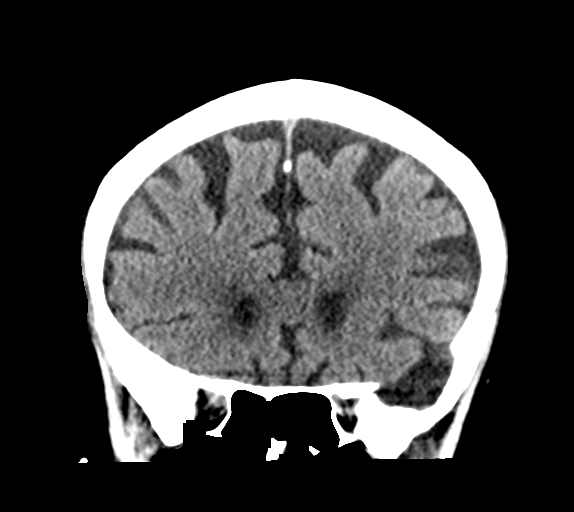
[im 28/62  brain]
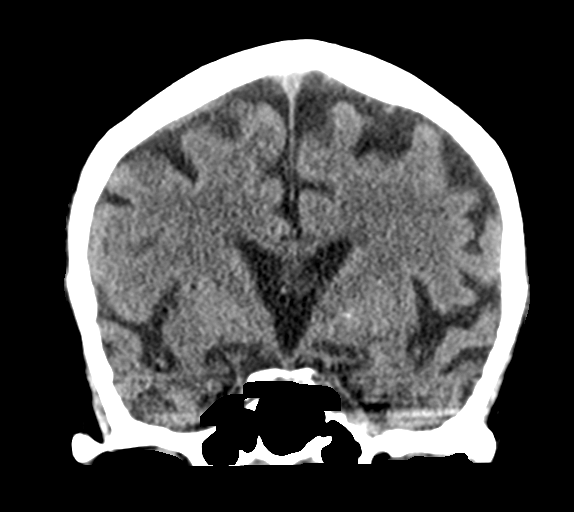
[im 34/62  brain]
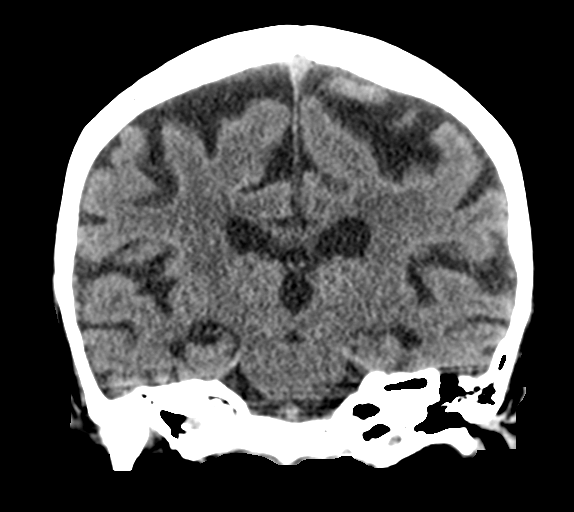

[Series 6: sagittal soft tissue · sagittal · 0.30mm/px · 3 of 52 slices shown]
[im 18/52  brain]
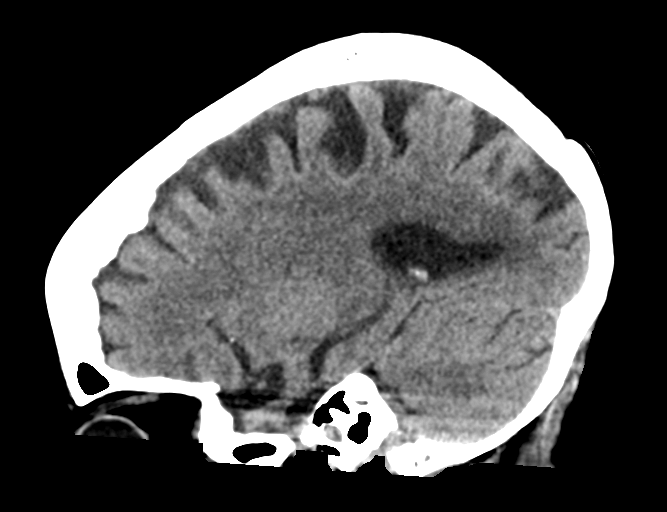
[im 26/52  brain]
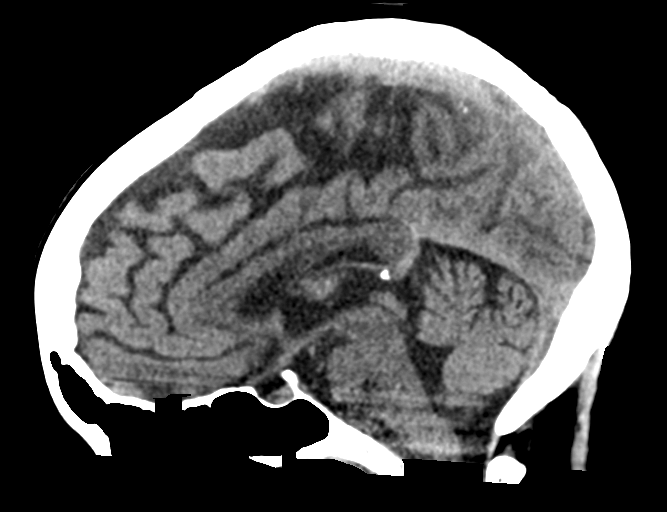
[im 35/52  brain]
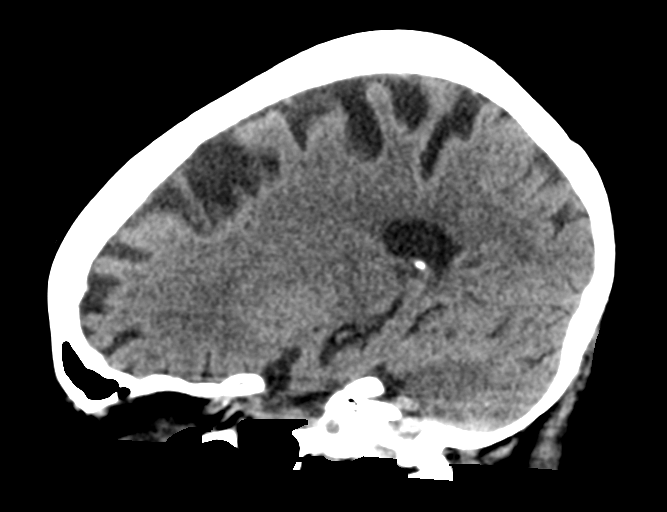

[14 of 47 positions shown; findings below may reference images not displayed]

FINDINGS: CT HEAD FINDINGS

Brain: No evidence of acute infarction, hemorrhage, hydrocephalus,
extra-axial collection or mass lesion/mass effect. Stable mild
atrophy and chronic microvascular ischemic changes. Old lacunar
infarct in the left thalamus again noted.

Vascular: Calcified atherosclerosis at the skull base. No hyperdense
vessel.

Skull: Negative for fracture or focal lesion.

Sinuses/Orbits: No acute finding. Unchanged partial opacification of
the right mastoid air cells.

Other: None.

CT CERVICAL SPINE FINDINGS

Alignment: No traumatic malalignment. Straightening of the normal
cervical lordosis with trace retrolisthesis at C3-C4.

Skull base and vertebrae: No acute fracture. No primary bone lesion
or focal pathologic process.

Soft tissues and spinal canal: No prevertebral fluid or swelling. No
visible canal hematoma.

Disc levels: Moderate to severe disc height loss and bilateral
uncovertebral hypertrophy at C3-C4. Scattered mild facet arthropathy
throughout the cervical spine, greater on the left.

Upper chest: Negative.

Other: None.
IMPRESSION: 1. No acute intracranial abnormality. Stable mild atrophy and
chronic microvascular ischemic changes.
2. No acute cervical spine fracture or traumatic listhesis.
3. Moderate to severe cervical spondylosis at C3-C4.

## 2021-09-08 IMAGING — MR MR 3D RECON AT SCANNER
4 series · 14 of 16 positions shown · IV contrast (gadavist)
Comparison: No prior abdominal MRI.

CLINICAL DATA: 80-year-old female presenting with signs and
symptoms concerning for biliary obstruction.



[Series 11: MRCP · coronal · 1.3mm · 0.59mm/px · 8 of 64 slices shown (1 of 2)]
[im 1/64]
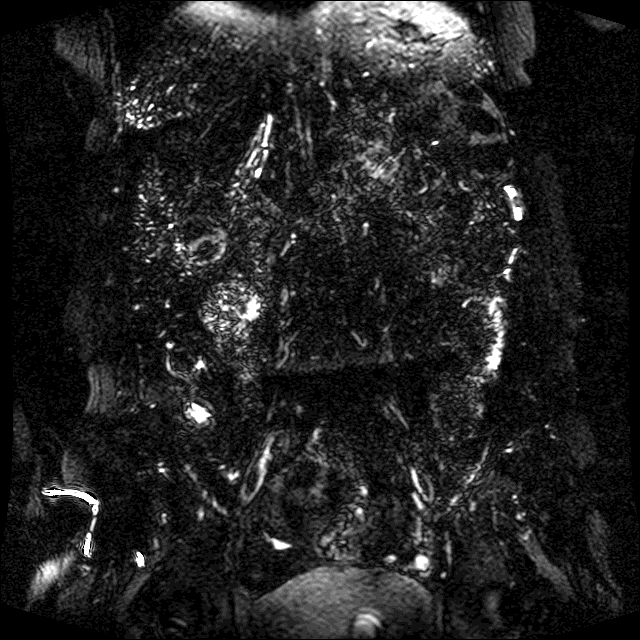
[im 8/64]
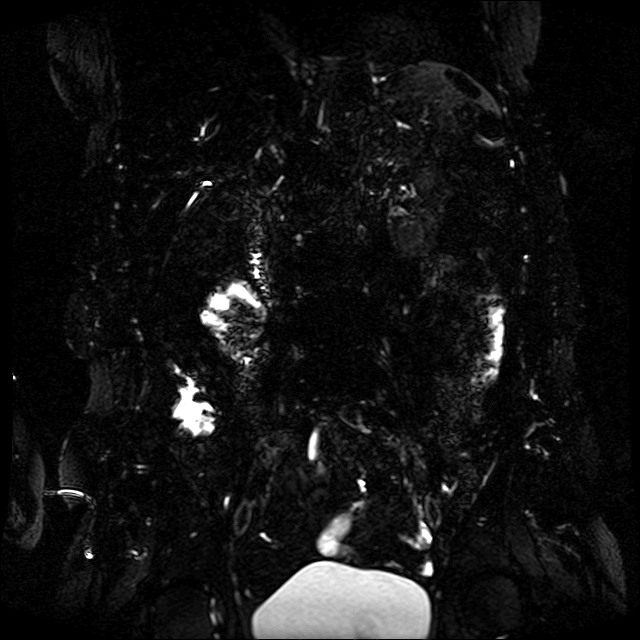
[im 16/64]
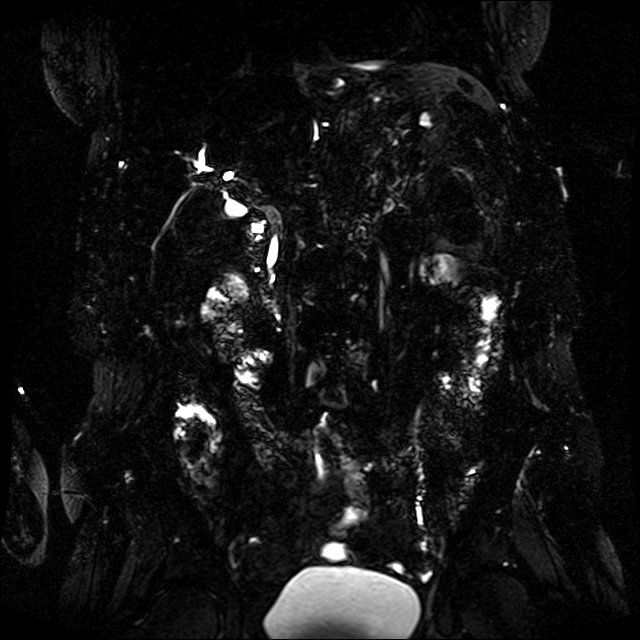
[im 32/64]
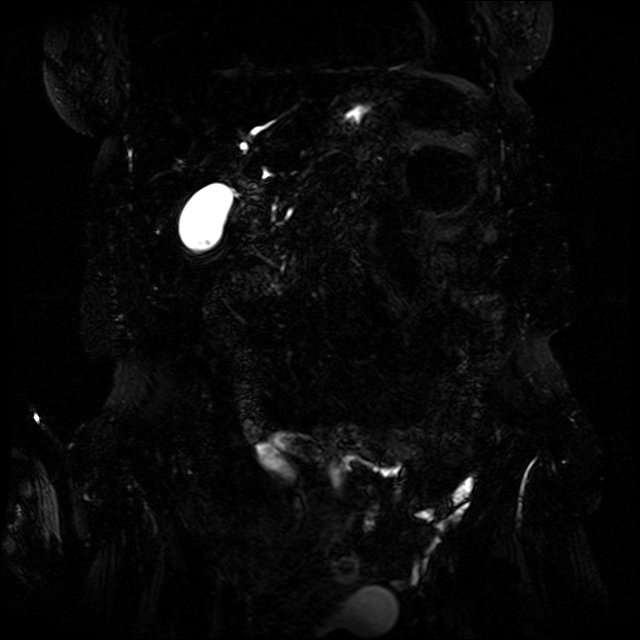
[im 40/64]
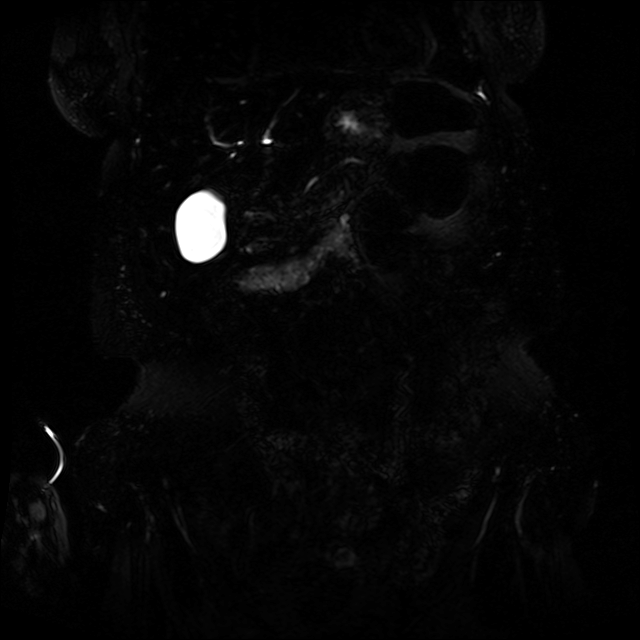
[im 48/64]
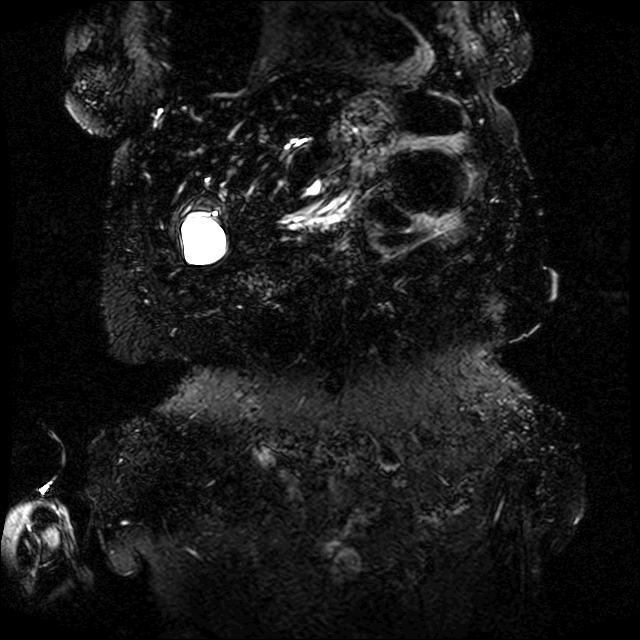
[im 56/64]
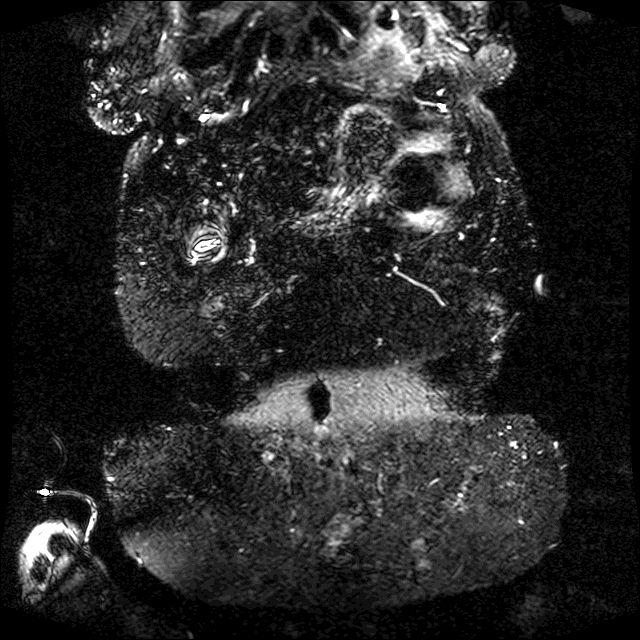
[im 64/64]
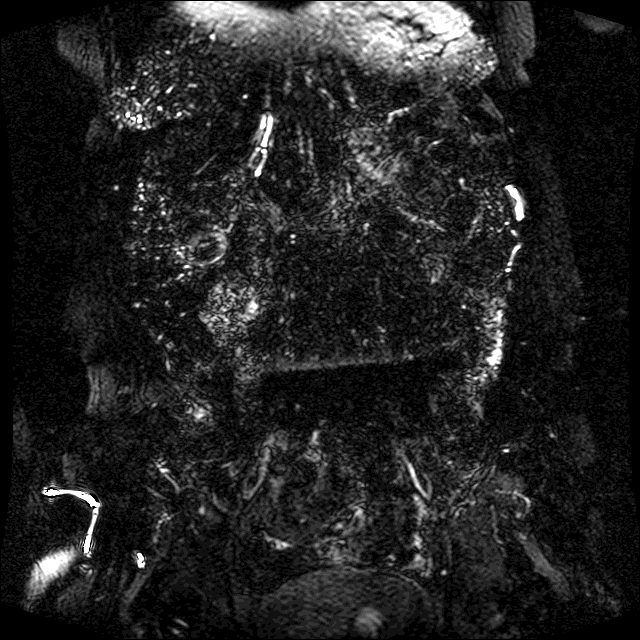

[Series 12: 3d mrcp_mip_radials · sagittal · 0.59mm/px · 2 of 19 slices shown]
[im 1/19]
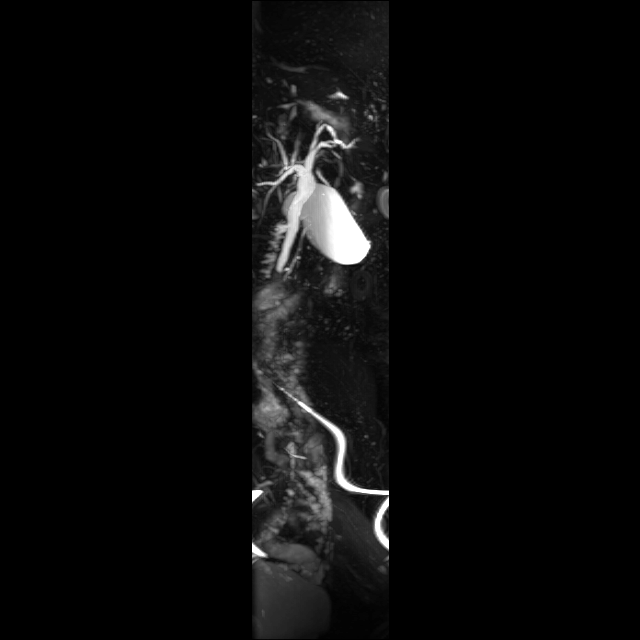
[im 10/19]
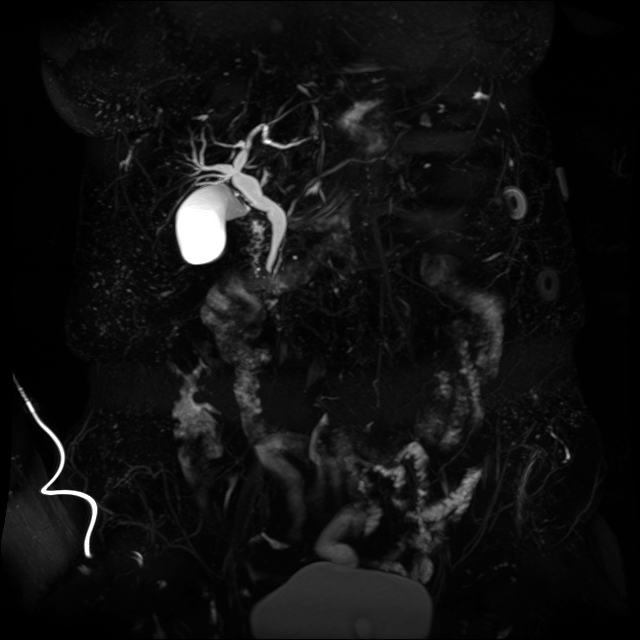

[Series 13: MRCP · coronal · 3.0mm · 1.12mm/px · 2 of 17 slices shown (2 of 2)]
[im 1/17]
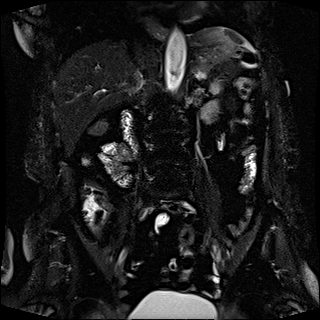
[im 17/17]
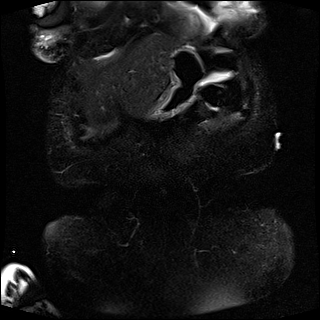

[Series 1032: mip · coronal · 0.6mm · 0.43mm/px · 2 of 16 slices shown]
[im 1/16]
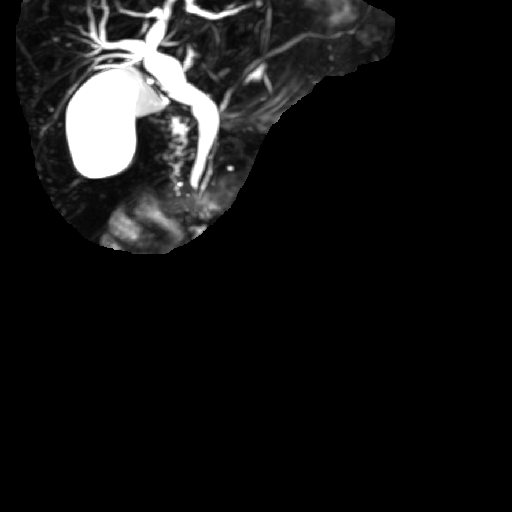
[im 16/16]
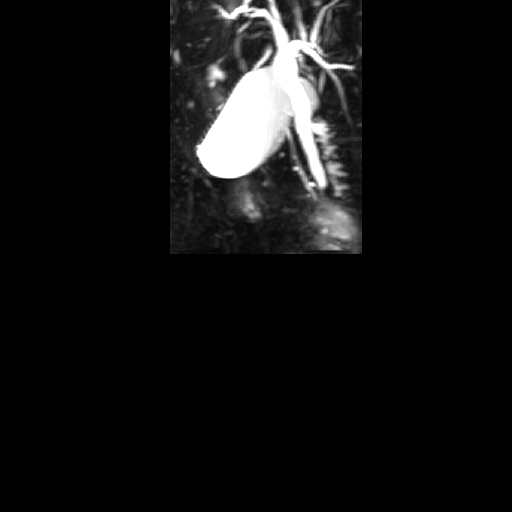

[14 of 16 positions shown; findings below may reference images not displayed]

FINDINGS: Lower chest: Unremarkable.

Hepatobiliary: Mild diffuse loss of signal intensity throughout the
hepatic parenchyma on out of phase dual echo images, indicative of a
background of mild hepatic steatosis. No suspicious cystic or solid
hepatic lesions. Gallbladder is normal in appearance. No filling
defects within the gallbladder to suggest gallstones. There is very
mild prominence of the intrahepatic biliary tree, and mild
dilatation of the common bile duct on MRCP images, measuring up to 9
mm in the porta hepatis. The duct smoothly tapers distally, without
evidence of choledocholithiasis or other obstructing lesion. These
findings are favored to be age related.

Pancreas: In the inferior aspect of the pancreatic head in the
expected location of the ampulla (axial image 23 of series 3 and
axial images 51 of series 19 and 21) there is a subtle area of low
T2 signal intensity which appears slightly hypovascular on delayed
post gadolinium imaging. No definitive diffusion restriction
confidently identified in this region. No other definite pancreatic
mass. No pancreatic ductal dilatation. No pancreatic or
peripancreatic fluid collections or inflammatory changes.

Spleen:  Unremarkable.

Adrenals/Urinary Tract: Mild multifocal cortical thinning in both
kidneys. No suspicious renal lesions. No hydroureteronephrosis in
the visualized portions of the abdomen. Bilateral adrenal glands are
normal in appearance.

Stomach/Bowel: Visualized portions are unremarkable.

Vascular/Lymphatic: No aneurysm identified in the visualized
abdominal vasculature. No lymphadenopathy noted in the abdomen.

Other: No significant volume of ascites noted in the visualized
portions of the peritoneal cavity.

Musculoskeletal: No aggressive appearing osseous lesions are noted
in the visualized portions of the skeleton.
IMPRESSION: 1. There is very mild intra and extrahepatic biliary ductal
dilatation, which could be within normal limits given the patient's
advanced age. However, there are findings which could be indicative
of a very subtle ampullary lesion in the pancreatic head, as
discussed above. This is not associated with pancreatic ductal
dilatation. However, correlation with ERCP and endoscopic ultrasound
should be considered if clinically appropriate.
2. Mild cortical thinning in both kidneys.

## 2021-09-08 IMAGING — MR MR ABDOMEN WO/W CM MRCP
21 of 23 series · 45 of 48 positions shown · IV contrast (gadavist)
Comparison: No prior abdominal MRI.

CLINICAL DATA: 80-year-old female presenting with signs and
symptoms concerning for biliary obstruction.



[Series 2: T2 · coronal · 6.0mm · 1.19mm/px · 1 of 30 slices shown (1 of 2)]
[im 1/30]
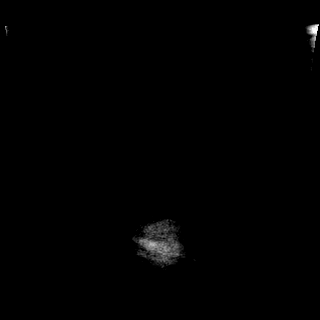

[Series 3: T2 · axial · 6.0mm · 1.19mm/px · 1 of 32 slices shown (2 of 2)]
[im 1/32]
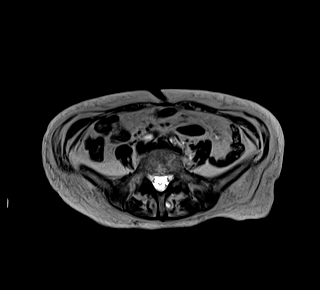

[Series 4: in & out · axial · 3.0mm · 1.19mm/px · z∈[-77,+136]mm · 2 of 72 slices shown (1 of 2)]
[im 1/72]
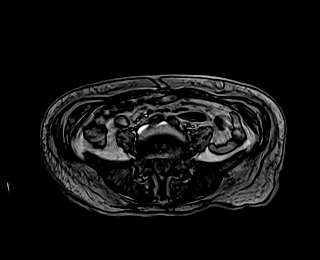
[im 72/72]
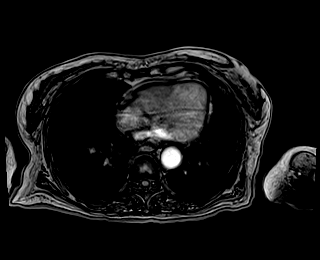

[Series 5: in & out · axial · 3.0mm · 1.19mm/px · z∈[-77,+136]mm · 2 of 72 slices shown (2 of 2)]
[im 1/72]
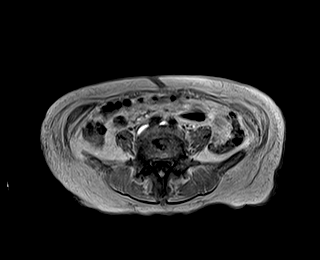
[im 72/72]
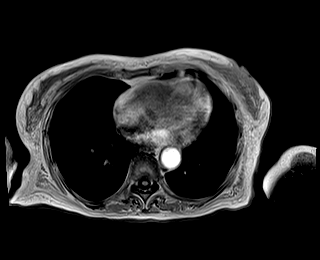

[Series 8: T2 fat-sat · axial · 6.0mm · 1.19mm/px · 1 of 30 slices shown]
[im 1/30]
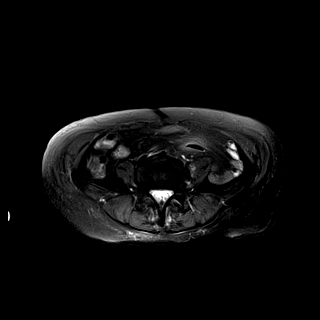

[Series 9: ax dwi_tracew · axial · 6.0mm · 1.42mm/px · z∈[-75,+134]mm · 3 of 90 slices shown]
[im 1/90]
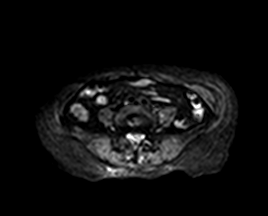
[im 45/90]
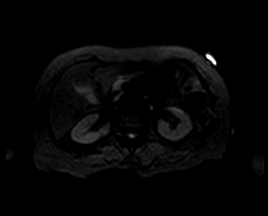
[im 90/90]
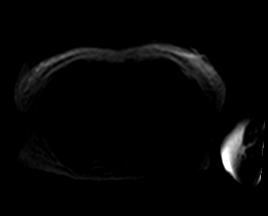

[Series 10: ax dwi_adc · axial · 6.0mm · 1.42mm/px · 1 of 30 slices shown]
[im 1/30]
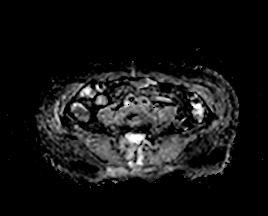

[Series 14: MRCP · coronal · 3.0mm · 1.12mm/px · 1 of 17 slices shown]
[im 1/17]
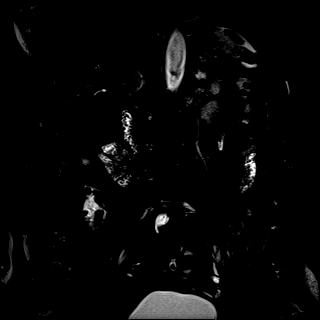

[Series 15: radials · coronal · 50.0mm · 0.78mm/px · 1 of 5 slices shown]
[im 1/5]
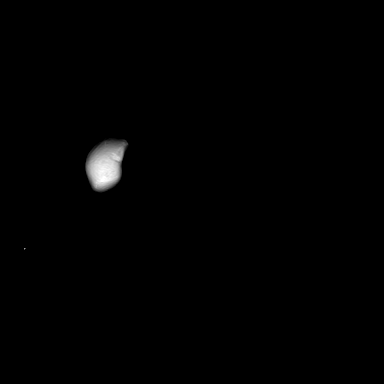

[Series 16: T1 dynamic fat-sat · axial · non-contrast · 3.0mm · 1.19mm/px · z∈[-56,+133]mm · 2 of 64 slices shown (1 of 6)]
[im 1/64]
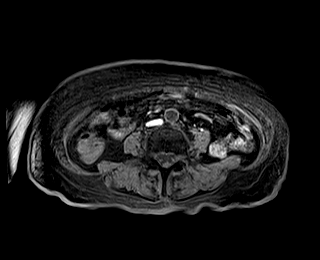
[im 64/64]
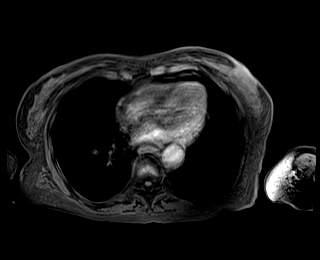

[Series 17: T1 dynamic fat-sat post-contrast · axial · 3.0mm · 1.19mm/px · z∈[-56,+133]mm · 2 of 64 slices shown (1 of 5)]
[im 1/64]
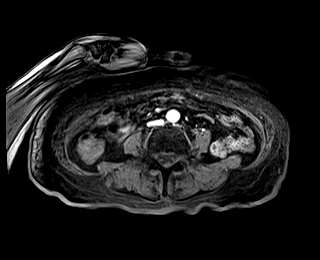
[im 64/64]
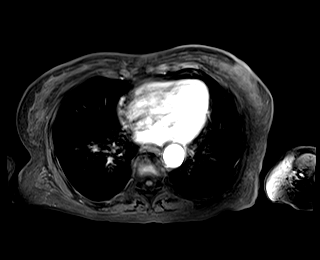

[Series 18: T1 dynamic fat-sat · axial · 3.0mm · 1.19mm/px · z∈[-56,+133]mm · 2 of 64 slices shown (2 of 6)]
[im 1/64]
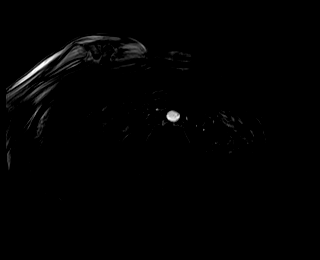
[im 64/64]
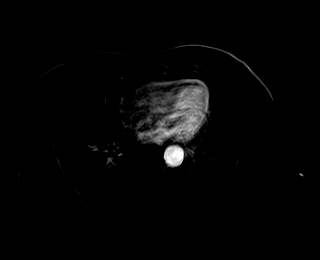

[Series 19: T1 dynamic fat-sat post-contrast · axial · 3.0mm · 1.19mm/px · z∈[-56,+133]mm · 3 of 64 slices shown (2 of 5)]
[im 1/64]
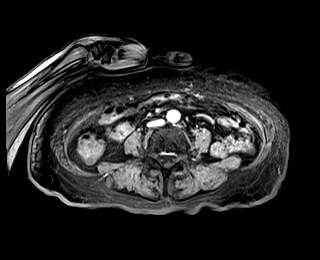
[im 32/64]
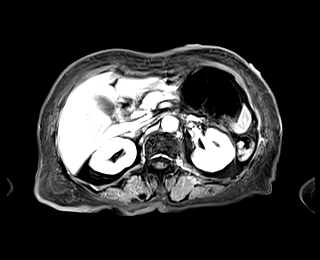
[im 64/64]
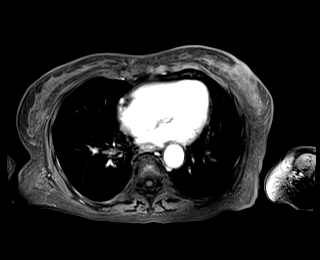

[Series 20: T1 dynamic fat-sat · axial · 3.0mm · 1.19mm/px · z∈[-56,+133]mm · 3 of 64 slices shown (3 of 6)]
[im 1/64]
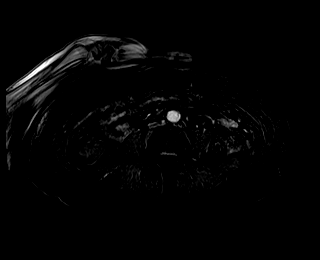
[im 32/64]
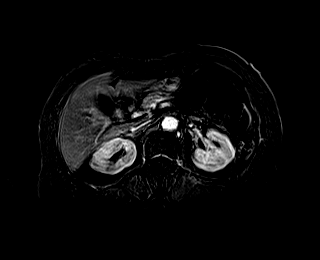
[im 64/64]
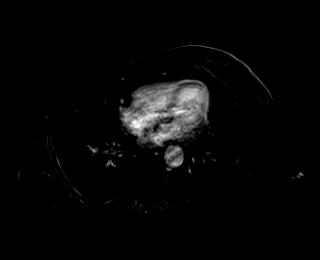

[Series 21: T1 dynamic fat-sat post-contrast · axial · 3.0mm · 1.19mm/px · z∈[-56,+133]mm · 3 of 64 slices shown (3 of 5)]
[im 1/64]
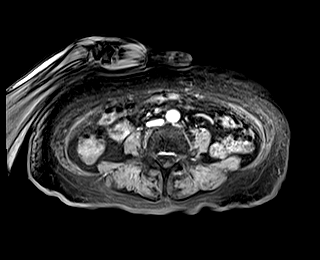
[im 32/64]
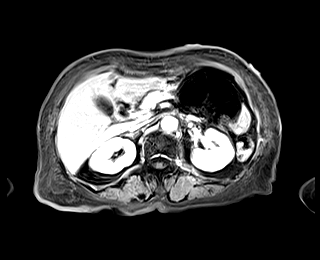
[im 64/64]
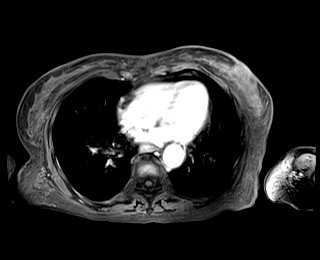

[Series 22: T1 dynamic fat-sat · axial · 3.0mm · 1.19mm/px · z∈[-56,+133]mm · 3 of 64 slices shown (4 of 6)]
[im 1/64]
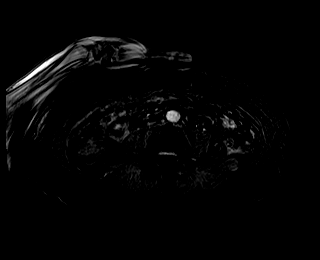
[im 32/64]
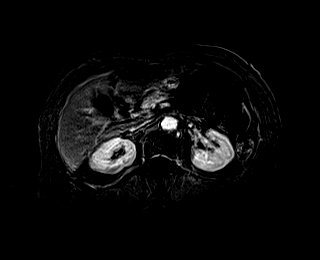
[im 64/64]
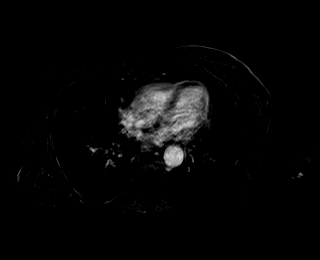

[Series 23: T1 dynamic post-contrast · coronal · 3.5mm · 1.31mm/px · 2 of 60 slices shown]
[im 1/60]
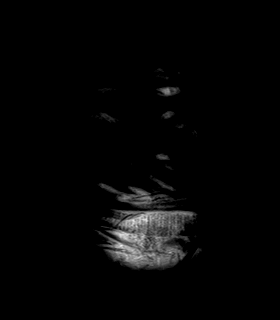
[im 60/60]
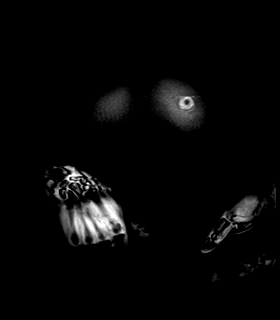

[Series 24: T1 dynamic fat-sat post-contrast · axial · 3.0mm · 1.19mm/px · z∈[-56,+133]mm · 3 of 64 slices shown (4 of 5)]
[im 1/64]
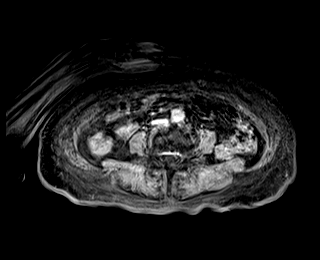
[im 32/64]
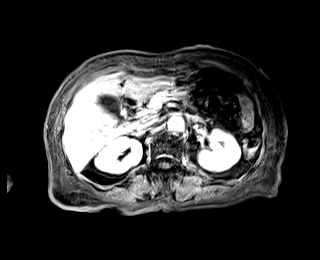
[im 64/64]
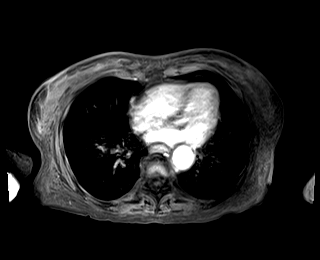

[Series 25: T1 dynamic fat-sat · axial · 3.0mm · 1.19mm/px · z∈[-56,+133]mm · 3 of 64 slices shown (5 of 6)]
[im 1/64]
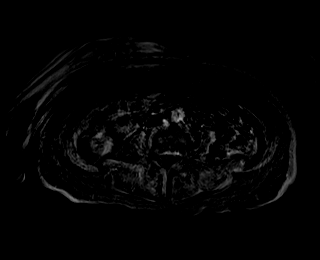
[im 32/64]
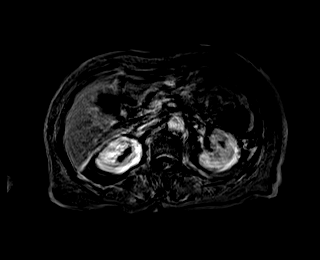
[im 64/64]
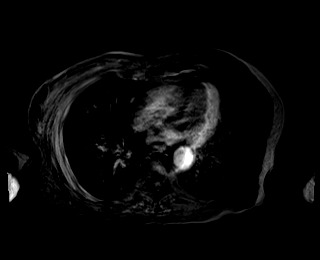

[Series 26: T1 dynamic fat-sat post-contrast · axial · 3.0mm · 1.19mm/px · z∈[-56,+133]mm · 3 of 64 slices shown (5 of 5)]
[im 1/64]
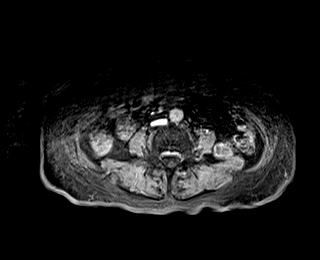
[im 32/64]
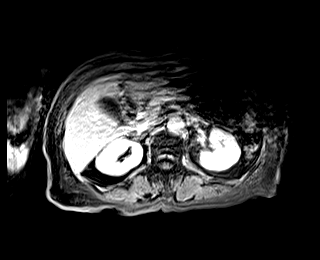
[im 64/64]
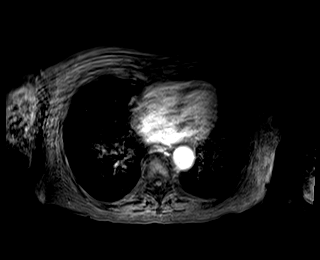

[Series 27: T1 dynamic fat-sat · axial · 3.0mm · 1.19mm/px · z∈[-56,+133]mm · 3 of 64 slices shown (6 of 6)]
[im 1/64]
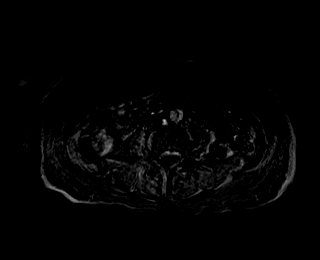
[im 32/64]
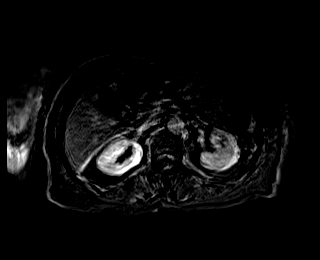
[im 64/64]
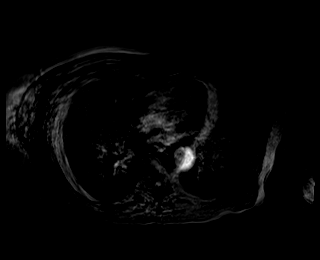

[45 of 48 positions shown; findings below may reference images not displayed]

FINDINGS: Lower chest: Unremarkable.

Hepatobiliary: Mild diffuse loss of signal intensity throughout the
hepatic parenchyma on out of phase dual echo images, indicative of a
background of mild hepatic steatosis. No suspicious cystic or solid
hepatic lesions. Gallbladder is normal in appearance. No filling
defects within the gallbladder to suggest gallstones. There is very
mild prominence of the intrahepatic biliary tree, and mild
dilatation of the common bile duct on MRCP images, measuring up to 9
mm in the porta hepatis. The duct smoothly tapers distally, without
evidence of choledocholithiasis or other obstructing lesion. These
findings are favored to be age related.

Pancreas: In the inferior aspect of the pancreatic head in the
expected location of the ampulla (axial image 23 of series 3 and
axial images 51 of series 19 and 21) there is a subtle area of low
T2 signal intensity which appears slightly hypovascular on delayed
post gadolinium imaging. No definitive diffusion restriction
confidently identified in this region. No other definite pancreatic
mass. No pancreatic ductal dilatation. No pancreatic or
peripancreatic fluid collections or inflammatory changes.

Spleen:  Unremarkable.

Adrenals/Urinary Tract: Mild multifocal cortical thinning in both
kidneys. No suspicious renal lesions. No hydroureteronephrosis in
the visualized portions of the abdomen. Bilateral adrenal glands are
normal in appearance.

Stomach/Bowel: Visualized portions are unremarkable.

Vascular/Lymphatic: No aneurysm identified in the visualized
abdominal vasculature. No lymphadenopathy noted in the abdomen.

Other: No significant volume of ascites noted in the visualized
portions of the peritoneal cavity.

Musculoskeletal: No aggressive appearing osseous lesions are noted
in the visualized portions of the skeleton.
IMPRESSION: 1. There is very mild intra and extrahepatic biliary ductal
dilatation, which could be within normal limits given the patient's
advanced age. However, there are findings which could be indicative
of a very subtle ampullary lesion in the pancreatic head, as
discussed above. This is not associated with pancreatic ductal
dilatation. However, correlation with ERCP and endoscopic ultrasound
should be considered if clinically appropriate.
2. Mild cortical thinning in both kidneys.

## 2021-09-08 IMAGING — CR DG CHEST 2V
2 series · 2 of 2 positions shown · non-contrast
Comparison: [DATE]

CLINICAL DATA: Dizziness, fall

EXAM:
CHEST - 2 VIEW

[chest lat]
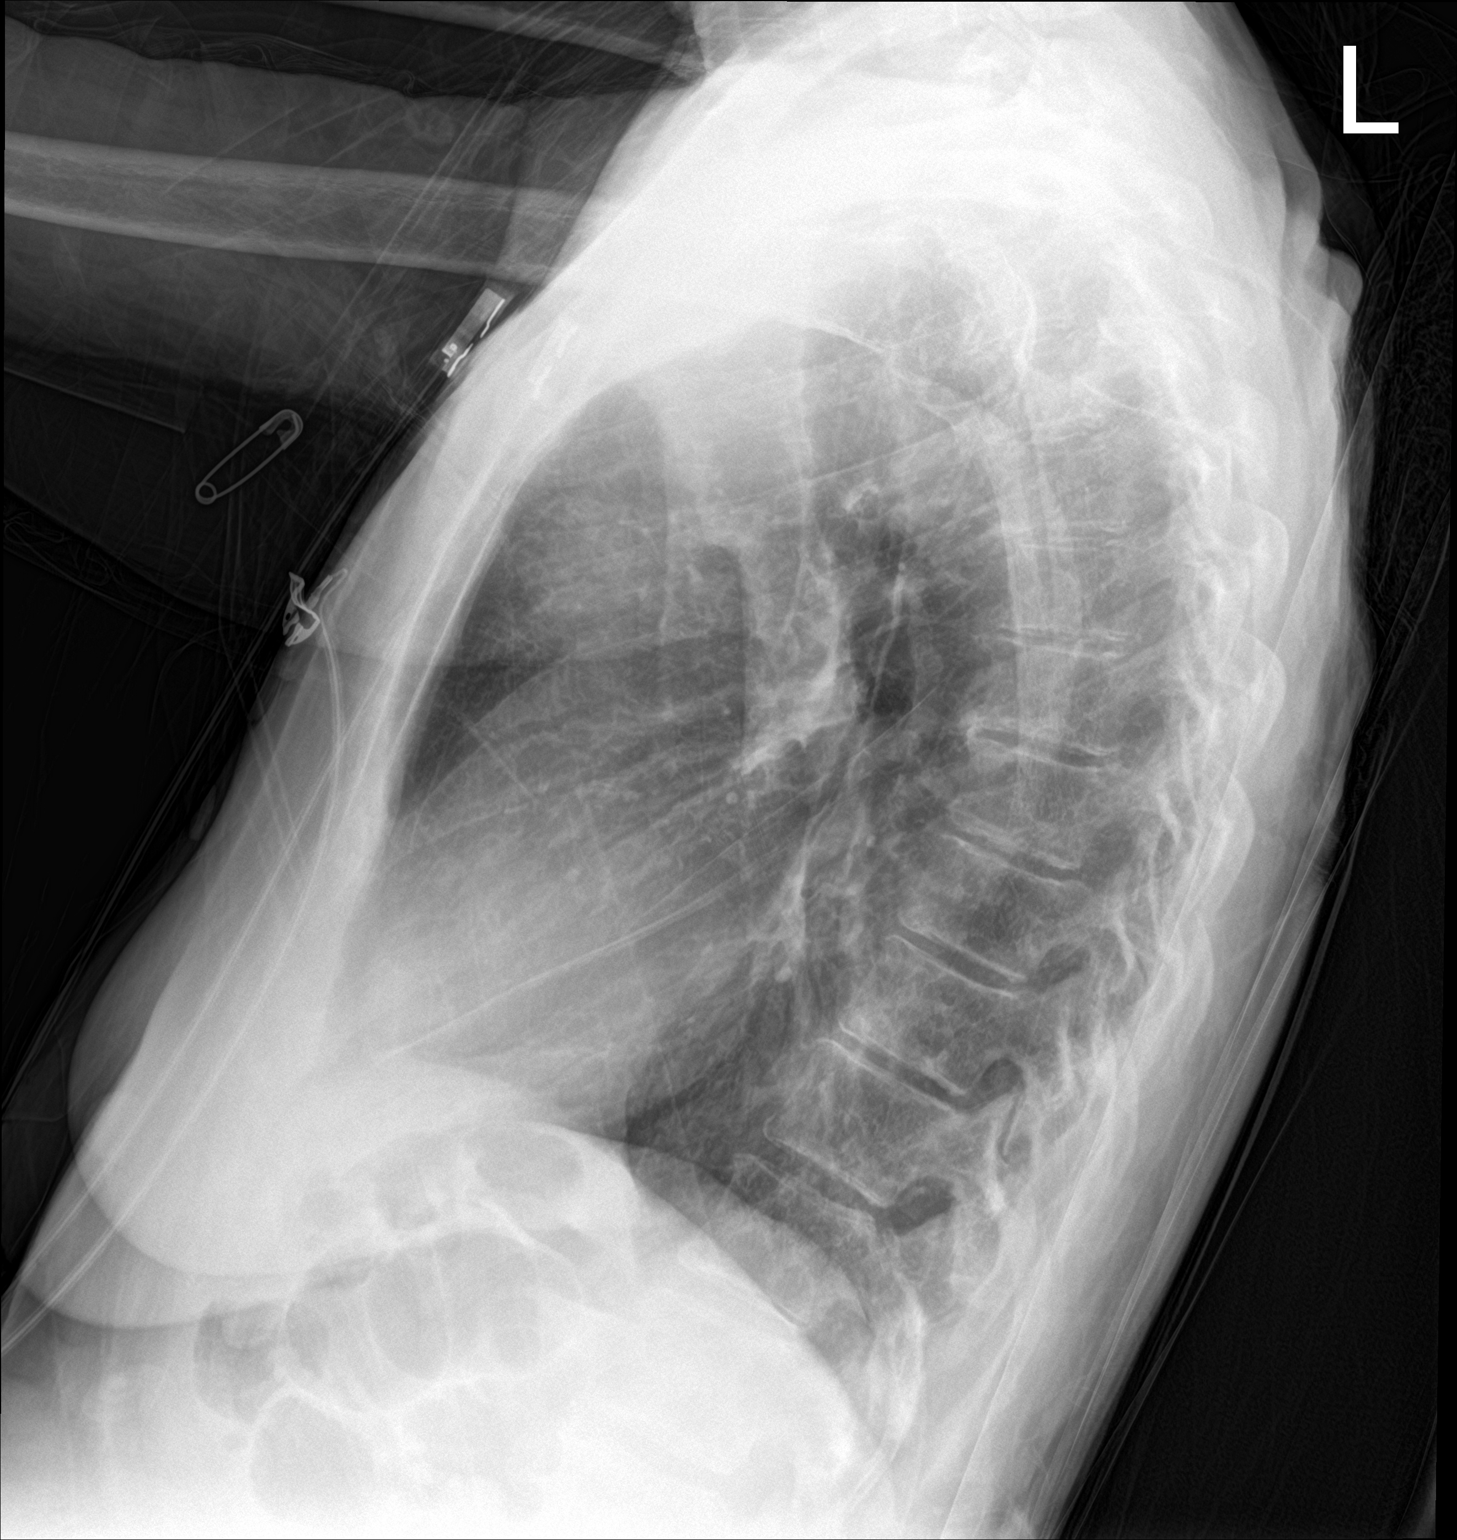

[chest ap]
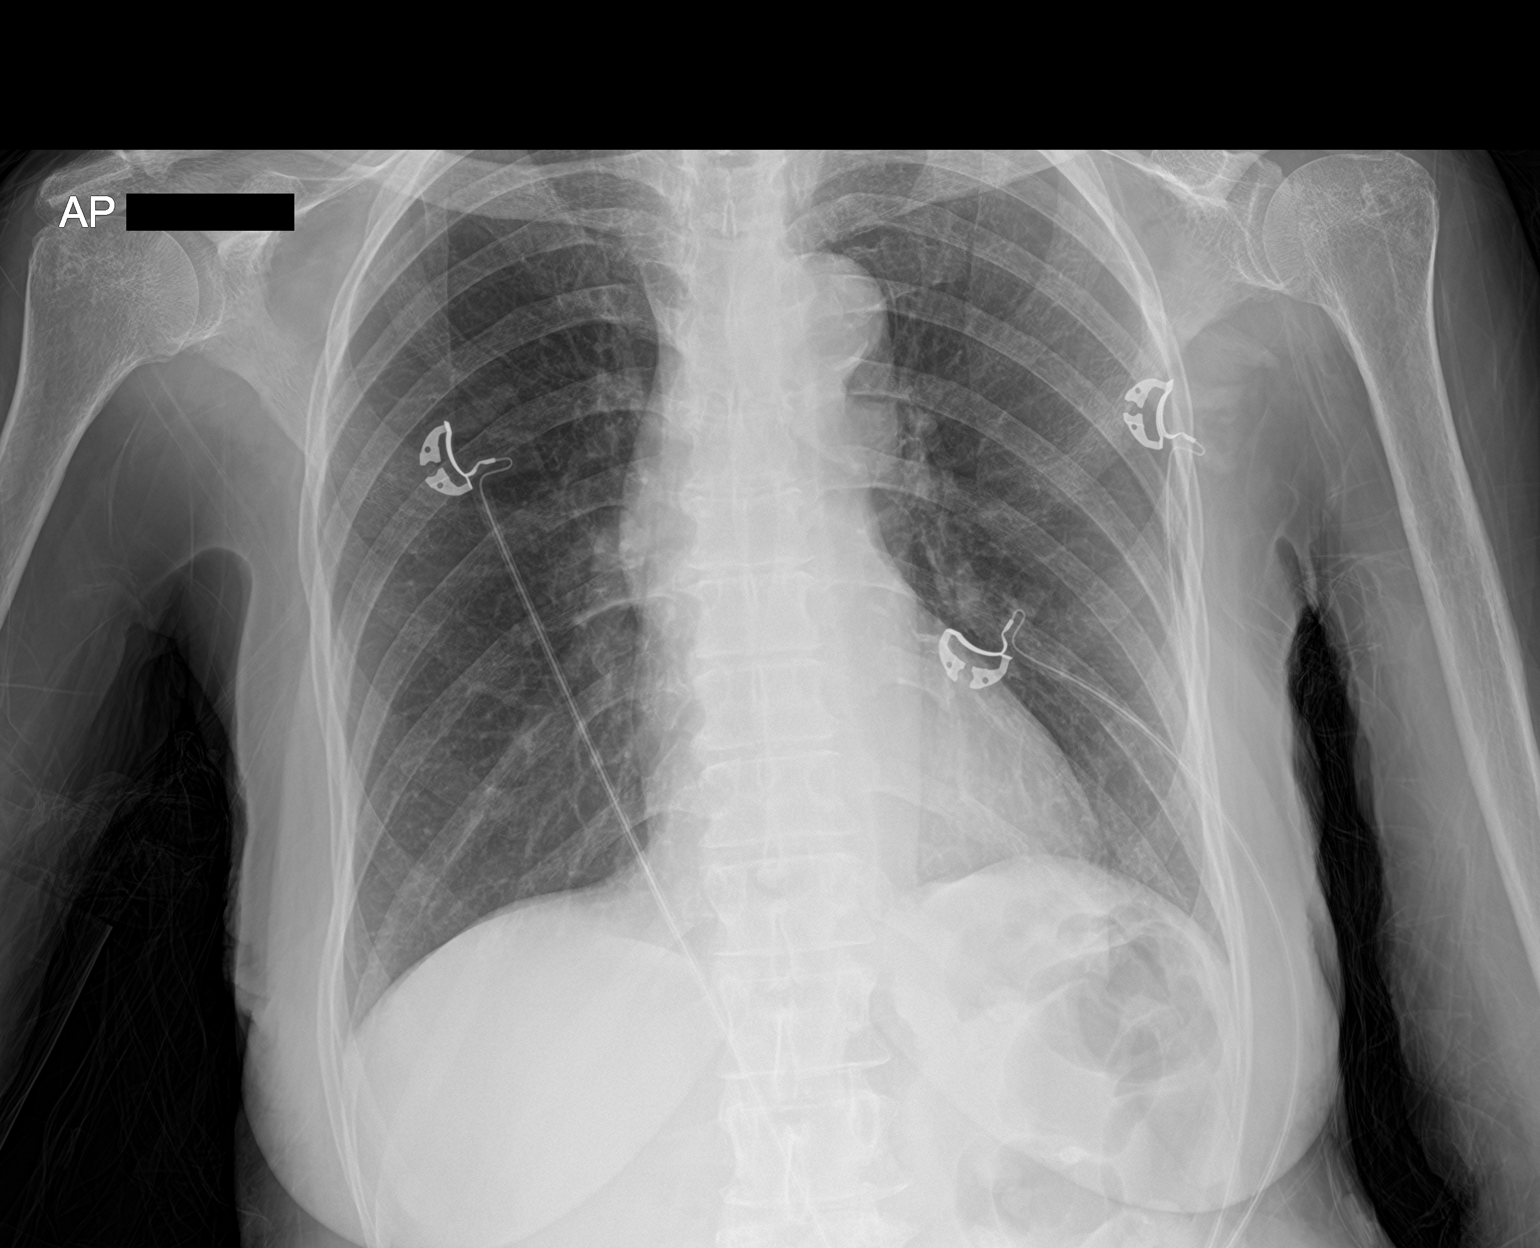

[2 of 2 positions shown; findings below may reference images not displayed]

FINDINGS: The heart size and mediastinal contours are within normal limits.
Both lungs are clear. The visualized skeletal structures are
unremarkable.
IMPRESSION: No active cardiopulmonary disease.

## 2021-09-08 MED ORDER — ACETAMINOPHEN 325 MG PO TABS: 650.0000 mg | ORAL_TABLET | Freq: Four times a day (QID) | ORAL | Status: DC | PRN

## 2021-09-08 MED ORDER — IRBESARTAN 150 MG PO TABS
75.0000 mg | ORAL_TABLET | Freq: Every day | ORAL | Status: DC
  Administered 2021-09-08 – 2021-09-10 (×2): 75 mg via ORAL
  Filled 2021-09-08 (×3): qty 1

## 2021-09-08 MED ORDER — HEPARIN SODIUM (PORCINE) 5000 UNIT/ML IJ SOLN
5000.0000 [IU] | Freq: Three times a day (TID) | INTRAMUSCULAR | Status: DC
  Administered 2021-09-09 – 2021-09-11 (×5): 5000 [IU] via SUBCUTANEOUS
  Filled 2021-09-08 (×5): qty 1

## 2021-09-08 MED ORDER — LACTATED RINGERS IV SOLN: INTRAVENOUS | Status: DC

## 2021-09-08 MED ORDER — ADULT MULTIVITAMIN W/MINERALS CH
1.0000 | ORAL_TABLET | Freq: Every day | ORAL | Status: DC
  Administered 2021-09-09 – 2021-09-11 (×3): 1 via ORAL
  Filled 2021-09-08 (×3): qty 1

## 2021-09-08 MED ORDER — OMEGA-3-ACID ETHYL ESTERS 1 G PO CAPS
1.0000 g | ORAL_CAPSULE | Freq: Every day | ORAL | Status: DC
  Administered 2021-09-09 – 2021-09-11 (×3): 1 g via ORAL
  Filled 2021-09-08 (×3): qty 1

## 2021-09-08 MED ORDER — PANTOPRAZOLE SODIUM 40 MG PO TBEC
40.0000 mg | DELAYED_RELEASE_TABLET | Freq: Every day | ORAL | Status: DC
Start: 2021-09-08 — End: 2021-09-10
  Administered 2021-09-08 – 2021-09-10 (×3): 40 mg via ORAL
  Filled 2021-09-08 (×3): qty 1

## 2021-09-08 MED ORDER — ROSUVASTATIN CALCIUM 20 MG PO TABS
20.0000 mg | ORAL_TABLET | Freq: Every day | ORAL | Status: DC
  Administered 2021-09-08 – 2021-09-10 (×3): 20 mg via ORAL
  Filled 2021-09-08 (×2): qty 1

## 2021-09-08 MED ORDER — BRIMONIDINE TARTRATE 0.2 % OP SOLN
1.0000 [drp] | Freq: Two times a day (BID) | OPHTHALMIC | Status: DC
  Administered 2021-09-09 – 2021-09-11 (×5): 1 [drp] via OPHTHALMIC
  Filled 2021-09-08: qty 5

## 2021-09-08 MED ORDER — POTASSIUM CHLORIDE CRYS ER 20 MEQ PO TBCR
40.0000 meq | EXTENDED_RELEASE_TABLET | Freq: Once | ORAL | Status: AC
  Administered 2021-09-08: 13:00:00 40 meq via ORAL
  Filled 2021-09-08: qty 2

## 2021-09-08 MED ORDER — LACTATED RINGERS IV BOLUS
500.0000 mL | Freq: Once | INTRAVENOUS | Status: AC
Start: 2021-09-08 — End: 2021-09-08
  Administered 2021-09-08: 16:00:00 500 mL via INTRAVENOUS

## 2021-09-08 MED ORDER — HYDRALAZINE HCL 20 MG/ML IJ SOLN: 5.0000 mg | INTRAMUSCULAR | Status: DC | PRN

## 2021-09-08 MED ORDER — ONDANSETRON HCL 4 MG/2ML IJ SOLN
4.0000 mg | Freq: Three times a day (TID) | INTRAMUSCULAR | Status: DC | PRN
Start: 2021-09-08 — End: 2021-09-11

## 2021-09-08 MED ORDER — GADOBUTROL 1 MMOL/ML IV SOLN
5.0000 mL | Freq: Once | INTRAVENOUS | Status: AC | PRN
  Administered 2021-09-08: 21:00:00 5 mL via INTRAVENOUS

## 2021-09-08 MED ORDER — POTASSIUM CHLORIDE 10 MEQ/100ML IV SOLN
10.0000 meq | Freq: Once | INTRAVENOUS | Status: AC
  Administered 2021-09-08: 14:00:00 10 meq via INTRAVENOUS
  Filled 2021-09-08: qty 100

## 2021-09-08 NOTE — ED Notes (Signed)
ED Provider at bedside. 

## 2021-09-08 NOTE — ED Provider Notes (Signed)
? ?[facility] ?Provider Note ? ? ? Event Date/Time  ? First MD Initiated Contact with Patient 09/08/21 1157   ?  (approximate) ? ? ?History  ? ?Dizziness and Fall ? ? ?HPI ? ?Angela Horton is a 81 y.o. female here with generalized weakness.  The patient reportedly has had progressively worsening loss of appetite, intermittent epigastric discomfort, and lightheadedness.  She has been increasingly weak.  She has been seen by her PCP and had a CT done recently, which did show gastric thickening.  She does not believe she is seen in a GI specialist for this.  Over the last several days, she has had worsening weakness and is actually fallen twice in the last 24 hours.  She states her legs just gave out.  Denies any known fevers or chills.  No recent medication changes.  She states she just does not want to eat.  She gets lightheaded when she stands up.  No dysuria or hematuria.  No other medical complaints. ?  ? ? ?Physical Exam  ? ?Triage Vital Signs: ?ED Triage Vitals  ?Enc Vitals Group  ?   BP 09/08/21 1049 119/86  ?   Pulse Rate 09/08/21 1049 63  ?   Resp 09/08/21 1049 16  ?   Temp 09/08/21 1049 97.7 ?F (36.5 ?C)  ?   Temp Source 09/08/21 1049 Oral  ?   SpO2 09/08/21 1049 93 %  ?   Weight 09/08/21 1041 124 lb 1.9 oz (56.3 kg)  ?   Height 09/08/21 1041 '5\' 8"'$  (1.727 m)  ?   Head Circumference --   ?   Peak Flow --   ?   Pain Score 09/08/21 1041 0  ?   Pain Loc --   ?   Pain Edu? --   ?   Excl. in [place]? --   ? ? ?Most recent vital signs: ?Vitals:  ? 09/08/21 1744 09/08/21 1953  ?BP: (!) 179/98 (!) 148/81  ?Pulse: 81 83  ?Resp: 18 18  ?Temp: 97.9 ?F (36.6 ?C) 97.9 ?F (36.6 ?C)  ?SpO2: 100% 94%  ? ? ? ?General: Awake, no distress.  ?CV:  Good peripheral perfusion.  Regular rate and rhythm. ?Resp:  Normal effort.  No tachypnea ?Abd:  No distention.  No significant tenderness. ?Other:  Mildly dry mucous membranes.  Poor skin turgor. ? ? ?ED Results / Procedures / Treatments  ? ?Labs ?(all labs  ordered are listed, but only abnormal results are displayed) ?Labs Reviewed  ?BASIC METABOLIC PANEL - Abnormal; Notable for the following components:  ?    Result Value  ? Potassium 3.0 (*)   ? Chloride 97 (*)   ? Glucose, Bld 100 (*)   ? Creatinine, Ser 1.07 (*)   ? GFR, Estimated 52 (*)   ? All other components within normal limits  ?CBC - Abnormal; Notable for the following components:  ? RBC 3.57 (*)   ? Hemoglobin 10.2 (*)   ? HCT 31.2 (*)   ? RDW 17.1 (*)   ? All other components within normal limits  ?HEPATIC FUNCTION PANEL - Abnormal; Notable for the following components:  ? Albumin 3.1 (*)   ? AST 56 (*)   ? Alkaline Phosphatase 31 (*)   ? All other components within normal limits  ?LIPASE, BLOOD - Abnormal; Notable for the following components:  ? Lipase 168 (*)   ? All other components within normal limits  ?RESP PANEL BY RT-PCR (FLU  A&B, COVID) ARPGX2  ?LACTIC ACID, PLASMA  ?MAGNESIUM  ?TRIGLYCERIDES  ?URINALYSIS, ROUTINE W REFLEX MICROSCOPIC  ?BASIC METABOLIC PANEL  ? ? ? ?EKG ?Normal sinus rhythm, ventricular rate 90.  PR 148, QRS 66, QTc 452.  No acute ST elevations or depressions. ? ? ?RADIOLOGY ?CT head: No acute intracranial abnormality ?CT C-spine: No acute cervical fracture ?Chest x-ray: No active disease ? ? ?I also independently reviewed and agree wit radiologist interpretations. ? ? ?PROCEDURES: ? ?Critical Care performed: No ? ?.1-3 Lead EKG Interpretation ?Performed by: Duffy Bruce, MD ?Authorized by: Duffy Bruce, MD  ? ?  Interpretation: normal   ?  ECG rate:  80-90s ?  ECG rate assessment: normal   ?  Rhythm: sinus rhythm   ?  Ectopy: none   ?  Conduction: normal   ?Comments:  ?   Indication: Weakness ? ? ? ?MEDICATIONS ORDERED IN ED: ?Medications  ?lactated ringers infusion ( Intravenous Stopped 09/08/21 1821)  ?pantoprazole (PROTONIX) EC tablet 40 mg (40 mg Oral Given 09/08/21 1608)  ?ondansetron (ZOFRAN) injection 4 mg (has no administration in time range)  ?hydrALAZINE (APRESOLINE)  injection 5 mg (has no administration in time range)  ?acetaminophen (TYLENOL) tablet 650 mg (has no administration in time range)  ?heparin injection 5,000 Units (has no administration in time range)  ?rosuvastatin (CRESTOR) tablet 20 mg (has no administration in time range)  ?irbesartan (AVAPRO) tablet 75 mg (75 mg Oral Given 09/08/21 1753)  ?multivitamin with minerals tablet 1 tablet (has no administration in time range)  ?omega-3 acid ethyl esters (LOVAZA) capsule 1 g (has no administration in time range)  ?brimonidine (ALPHAGAN) 0.2 % ophthalmic solution 1 drop (has no administration in time range)  ?potassium chloride SA (KLOR-CON M) CR tablet 40 mEq (40 mEq Oral Given 09/08/21 1323)  ?potassium chloride 10 mEq in 100 mL IVPB (0 mEq Intravenous Stopped 09/08/21 1437)  ?lactated ringers bolus 500 mL ( Intravenous Stopped 09/08/21 1714)  ?gadobutrol (GADAVIST) 1 MMOL/ML injection 5 mL (5 mLs Intravenous Contrast Given 09/08/21 2105)  ? ? ? ?IMPRESSION / MDM / ASSESSMENT AND PLAN / ED COURSE  ?I reviewed the triage vital signs and the nursing notes. ?             ?               ? ? ?The patient is on the cardiac monitor to evaluate for evidence of arrhythmia and/or significant heart rate changes. ? ?MDM ?81 yo F here with dizziness, weakness. Pt has a documented h/o progressive decline recently, with FTT and weight loss. In review of her recent CT, there is c/f gastritis/GERD, and clinically concern for possible gastric/pancreatic mass. No signs of infection currently. No leukocytosis noted. BMP with mild hypokalemia likely from her poor PO intake. LFTs, Bili also unremarkable though lipase elevated at 168. Re: her fall, CT head/C spine obtained, reviewed and are negative. CXR is clear with no signs of rib fx.  ? ?Will plan to admit for worsening weakness, falls, in setting of suspected gastritis versus possibly malignancy. May ultimately need MRCP per review of CT. Hospitalist consulted and will admit. ? ? ?MEDICATIONS  GIVEN IN ED: ?Medications  ?lactated ringers infusion ( Intravenous Stopped 09/08/21 1821)  ?pantoprazole (PROTONIX) EC tablet 40 mg (40 mg Oral Given 09/08/21 1608)  ?ondansetron (ZOFRAN) injection 4 mg (has no administration in time range)  ?hydrALAZINE (APRESOLINE) injection 5 mg (has no administration in time range)  ?acetaminophen (TYLENOL) tablet 650 mg (  has no administration in time range)  ?heparin injection 5,000 Units (has no administration in time range)  ?rosuvastatin (CRESTOR) tablet 20 mg (has no administration in time range)  ?irbesartan (AVAPRO) tablet 75 mg (75 mg Oral Given 09/08/21 1753)  ?multivitamin with minerals tablet 1 tablet (has no administration in time range)  ?omega-3 acid ethyl esters (LOVAZA) capsule 1 g (has no administration in time range)  ?brimonidine (ALPHAGAN) 0.2 % ophthalmic solution 1 drop (has no administration in time range)  ?potassium chloride SA (KLOR-CON M) CR tablet 40 mEq (40 mEq Oral Given 09/08/21 1323)  ?potassium chloride 10 mEq in 100 mL IVPB (0 mEq Intravenous Stopped 09/08/21 1437)  ?lactated ringers bolus 500 mL ( Intravenous Stopped 09/08/21 1714)  ?gadobutrol (GADAVIST) 1 MMOL/ML injection 5 mL (5 mLs Intravenous Contrast Given 09/08/21 2105)  ? ? ? ?Consults:  ?None ? ? ?EMR reviewed  ?Prior CT scan from 2/23 reviewed ?PCP visit notes with Rosana Berger reviewed, 08/11/21 ? ? ? ? ?FINAL CLINICAL IMPRESSION(S) / ED DIAGNOSES  ? ?Final diagnoses:  ?Generalized weakness  ?Fall, initial encounter  ? ? ? ?Rx / DC Orders  ? ?ED Discharge Orders   ? ? None  ? ?  ? ? ? ?Note:  This document was prepared using Dragon voice recognition software and may include unintentional dictation errors. ?  ?Duffy Bruce, MD ?09/08/21 2232 ? ?

## 2021-09-08 NOTE — Telephone Encounter (Signed)
Reason for Disposition ? [1] Can't stand (bear weight) or walk AND [2] new-onset after fall ? ?Answer Assessment - Initial Assessment Questions ?1. MECHANISM: "How did the fall happen?" ?    Coralyn Mark, daughter in law calling in.  What should we do?   She has fallen this morning.   My father in law called me and said she fell.   ?She broke her glasses when she fell.   He got her up on the bed but can't get her to stand up or get her clothes on.   Daughter in law is not with pt so unable to tell me anything about injuries.   I advised her to have her father in law who is with her to call 911 to have her evaluated.   If she can't stand or get her clothes on then she probably needs to be evaluated at the ED. ?Pt has an appt this morning in the office at 11:20 but he can't get her to stand or do anything to get her ready.   Daughter in law unable to tell me how she fell or if she had any injuries other than her glasses are broken. ? ?Daughter in law was agreeable to calling 911.    I will cancel the appt for this morning at 11:20 in the office. ?2. DOMESTIC VIOLENCE AND ELDER ABUSE SCREENING: "Did you fall because someone pushed you or tried to hurt you?" If Yes, ask: "Are you safe now?" ?    No ?3. ONSET: "When did the fall happen?" (e.g., minutes, hours, or days ago) ?    This morning ?4. LOCATION: "What part of the body hit the ground?" (e.g., back, buttocks, head, hips, knees, hands, head, stomach) ?    Don't know.   Her glasses are broken. ?5. INJURY: "Did you hurt (injure) yourself when you fell?" If Yes, ask: "What did you injure? Tell me more about this?" (e.g., body area; type of injury; pain severity)" ?    See above ?6. PAIN: "Is there any pain?" If Yes, ask: "How bad is the pain?" (e.g., Scale 1-10; or mild,  ?moderate, severe) ?  - NONE (0): No pain ?  - MILD (1-3): Doesn't interfere with normal activities  ?  - MODERATE (4-7): Interferes with normal activities or awakens from sleep  ?  - SEVERE (8-10):  Excruciating pain, unable to do any normal activities  ?    *No Answer* ?7. SIZE: For cuts, bruises, or swelling, ask: "How large is it?" (e.g., inches or centimeters)  ?    *No Answer* ?8. PREGNANCY: "Is there any chance you are pregnant?" "When was your last menstrual period?" ?    *No Answer* ?9. OTHER SYMPTOMS: "Do you have any other symptoms?" (e.g., dizziness, fever, weakness; new onset or worsening).  ?    *No Answer* ?10. CAUSE: "What do you think caused the fall (or falling)?" (e.g., tripped, dizzy spell) ?      *No Answer* ? ?Protocols used: Falls and Falling-A-AH ? ?

## 2021-09-08 NOTE — Progress Notes (Deleted)
? ?Established Patient Office Visit ? ?Subjective:  ?Patient ID: Angela Horton, female    DOB: 05-Jun-1941  Age: 81 y.o. MRN: [id number] ? ?CC: No chief complaint on file. ? ? ?HPI ?Angela Horton presents for follow up on recent CT scan. She has been seen previously for weakness/lightheadedness and weight loss. Her blood pressure medications were changed to Valsartan 20 mg daily.  ? ?Has lost about 30 pounds in 6 weeks -  ? ?CT A/P results 08/25/21: ?IMPRESSION: ?1. Wall thickening of the distal esophagus, suspicious for ?esophagitis or reflux. Mild pre pyloric gastric wall thickening ?versus peristalsis. Finding could be further assessed with endoscopy ?in the setting of weight loss and upper abdominal pain. ?2. Mild central intrahepatic biliary ductal dilatation without ?common bile duct dilatation. No evidence of obstructing mass. ?Recommend correlation with LFTs. If elevated, consider further ?evaluation with MRCP or ERCP. ?3. Mild hepatic steatosis. ?4. 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 ? ?Blood work from 1/23 significant for AST elevated at 49, ALT 18 and Alk Phos 47, hbg 10.9. ? ?lightheadedness and weakness. LOV 2 weeks ago. At that time her BP was 96/58. She normally takes Amlodipine 10 and Valsartan 40 for blood pressure but Amlodipine discontinued at last visit. Today she states that her symptoms are better but she does still feel weak in the mornings. Checking BP at home, usually about 108-122/80. Denies headache, changes in vision, chest pain, shortness of breath. ?  ?She has lost weight very quickly, lost about 30 pounds in about 6 weeks - states her appetite has decreased although she is eating 3 meals a day with fruit in between as a snack. According to her husband she is eating about 33% of her meals. She endorses epigastric abdominal pain and bloating after she eats. Tylenol helps the pain. It doesn't radiate and is only after she eats. She has a BM every other day, no blood in stools or  dark stools, no constipation, no diarrhea, nausea/vomiting. No urinary symptoms, no fevers.  ? ?Past Medical History:  ?Diagnosis Date  ? Diabetes mellitus without complication ([place])   ? Gout   ? Hyperlipidemia   ? Hypertension   ? Obesity   ? ? ?Past Surgical History:  ?Procedure Laterality Date  ? ABDOMINAL HYSTERECTOMY    ? ? ?Family History  ?Problem Relation Age of Onset  ? Kidney disease Mother   ? Kidney disease Sister   ? Stroke Brother   ? Heart disease Sister   ? Stroke Sister   ? ? ?Social History  ? ?Socioeconomic History  ? Marital status: Married  ?  Spouse name: Not on file  ? Number of children: 4  ? Years of education: Not on file  ? Highest education level: Not on file  ?Occupational History  ? Not on file  ?Tobacco Use  ? Smoking status: Never  ? Smokeless tobacco: Never  ? Tobacco comments:  ?  Smoking cessation materials not required  ?Vaping Use  ? Vaping Use: Never used  ?Substance and Sexual Activity  ? Alcohol use: No  ?  Alcohol/week: 0.0 standard drinks  ? Drug use: No  ? Sexual activity: Yes  ?Other Topics Concern  ? Not on file  ?Social History Narrative  ? Not on file  ? ?Social Determinants of Health  ? ?Financial Resource Strain: Low Risk   ? Difficulty of Paying Living Expenses: Not hard at all  ?Food Insecurity: No Food Insecurity  ? Worried About [employer] of  Food in the Last Year: Never true  ? Ran Out of Food in the Last Year: Never true  ?Transportation Needs: No Transportation Needs  ? Lack of Transportation (Medical): No  ? Lack of Transportation (Non-Medical): No  ?Physical Activity: Inactive  ? Days of Exercise per Week: 0 days  ? Minutes of Exercise per Session: 0 min  ?Stress: No Stress Concern Present  ? Feeling of Stress : Not at all  ?Social Connections: Moderately Integrated  ? Frequency of Communication with Friends and Family: More than three times a week  ? Frequency of Social Gatherings with Friends and Family: More than three times a week  ? Attends Religious  Services: More than 4 times per year  ? Active Member of Clubs or Organizations: No  ? Attends Archivist Meetings: Never  ? Marital Status: Married  ?Intimate Partner Violence: Not At Risk  ? Fear of Current or Ex-Partner: No  ? Emotionally Abused: No  ? Physically Abused: No  ? Sexually Abused: No  ? ? ?Outpatient Medications Prior to Visit  ?Medication Sig Dispense Refill  ? brimonidine (ALPHAGAN) 0.2 % ophthalmic solution Place 1 drop into both eyes 2 (two) times daily.  3  ? Omega-3 Fatty Acids (FISH OIL) 1000 MG CAPS Take 1 capsule by mouth daily.    ? rosuvastatin (CRESTOR) 20 MG tablet TAKE 1 TABLET BY MOUTH EVERYDAY AT BEDTIME 90 tablet 3  ? timolol (TIMOPTIC) 0.5 % ophthalmic solution INSTILL 1 DROP INTO BOTH EYES EVERY MORNING  3  ? valsartan (DIOVAN) 40 MG tablet Take 0.5 tablets (20 mg total) by mouth daily. 90 tablet 3  ? ?No facility-administered medications prior to visit.  ? ? ?Allergies  ?Allergen Reactions  ? Metformin And Related   ?  Upset stomach headaches  ? ? ?ROS ?Review of Systems ? ?  ?Objective:  ?  ?Physical Exam ? ?There were no vitals taken for this visit. ?Wt Readings from Last 3 Encounters:  ?08/11/21 124 lb 3.2 oz (56.3 kg)  ?07/28/21 129 lb 3.2 oz (58.6 kg)  ?07/13/21 133 lb 4.8 oz (60.5 kg)  ? ? ? ?Health Maintenance Due  ?Topic Date Due  ? Zoster Vaccines- Shingrix (1 of 2) Never done  ? OPHTHALMOLOGY EXAM  10/23/2018  ? COVID-19 Vaccine (4 - Booster for Pfizer series) 08/18/2020  ? FOOT EXAM  01/21/2021  ? ? ?There are no preventive care reminders to display for this patient. ? ?Lab Results  ?Component Value Date  ? TSH 0.994 01/07/2015  ? ?Lab Results  ?Component Value Date  ? WBC 3.8 07/13/2021  ? HGB 10.9 (L) 07/13/2021  ? HCT 33.3 (L) 07/13/2021  ? MCV 86.7 07/13/2021  ? PLT 259 07/13/2021  ? ?Lab Results  ?Component Value Date  ? NA 137 07/13/2021  ? K 4.0 07/13/2021  ? CO2 26 07/13/2021  ? GLUCOSE 128 (H) 07/13/2021  ? BUN 9 07/13/2021  ? CREATININE 1.20 (H)  08/25/2021  ? BILITOT 0.9 07/13/2021  ? ALKPHOS 45 06/26/2021  ? AST 49 (H) 07/13/2021  ? ALT 18 07/13/2021  ? PROT 6.9 07/13/2021  ? ALBUMIN 3.6 06/26/2021  ? CALCIUM 9.4 07/13/2021  ? ANIONGAP 8 07/01/2021  ? EGFR 38 (L) 06/07/2021  ? ?Lab Results  ?Component Value Date  ? CHOL 86 01/25/2021  ? ?Lab Results  ?Component Value Date  ? HDL 26 (L) 01/25/2021  ? ?Lab Results  ?Component Value Date  ? LDLCALC 45 01/25/2021  ? ?Lab  Results  ?Component Value Date  ? TRIG 72 01/25/2021  ? ?Lab Results  ?Component Value Date  ? CHOLHDL 3.3 01/25/2021  ? ?Lab Results  ?Component Value Date  ? HGBA1C 6.7 (H) 07/13/2021  ? ? ?  ?Assessment & Plan:  ? ?Problem List Items Addressed This Visit   ?None ? ? ?No orders of the defined types were placed in this encounter. ? ? ?Follow-up: No follow-ups on file.  ? ? ?Teodora Medici, DO ?

## 2021-09-08 NOTE — H&P (Signed)
History and Physical    Angela Horton GQQ:[id number] DOB: 1940-09-14 DOA: 09/08/2021  Referring MD/NP/PA:   PCP: Delsa Grana, PA-C   Patient coming from:  The patient is coming from home.   Chief Complaint: dizziness, weakness, fall   HPI: Angela Horton is a 81 y.o. female with medical history significant of hypertension, hyperlipidemia, diet-controlled diabetes, gout, CKD-3A, mild cognitive impairment, who presents with dizziness, weakness and fall.  Per her son and husband at the bedside, patient has not been feeling well for several weeks.  Patient has decreased oral intake, poor appetite, generalized weakness, dizziness and lightheadedness.  She fell twice in the past 24 hours.  She hit her head during the fall.  Patient does not have chest pain, cough, shortness of breath.  No fever or chills.  Denies nausea, vomiting, diarrhea or abdominal pain.  No symptoms of UTI.  Patient has had nearly 40 pounds weight loss recently.  No unilateral numbness or tinglings extremities.  No facial droop or slurred speech.  Patient was seen by her PCP and had CT of abdomen/pelvis done on 08/25/2021, which showed wall thickening of the distal esophagus, suspecting esophagitis and mild central intrahepatic biliary ductal dilation without common bile duct dilation.    CT-abd/pelvis on 08/25/21: 1. Wall thickening of the distal esophagus, suspicious for esophagitis or reflux. Mild pre pyloric gastric wall thickening versus peristalsis. Finding could be further assessed with endoscopy in the setting of weight loss and upper abdominal pain. 2. Mild central intrahepatic biliary ductal dilatation without common bile duct dilatation. No evidence of obstructing mass. Recommend correlation with LFTs. If elevated, consider further evaluation with MRCP or ERCP. 3. Mild hepatic steatosis. 4. 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   Aortic Atherosclerosis (ICD10-I70.0).    Data Reviewed and ED Course: pt  was found to have WBC 5.1, lipase 168, abnormal liver function (ALP 31, AST 56, ALT 21, total bilirubin 0.8), pending COVID PCR, lactic acid 1.4, stable renal function, potassium 3.0, magnesium 1.7, temperature normal, blood pressure 119/82, 169/82, heart rate 89, RR 16, oxygen saturation 93-99% on room air.  Chest x-ray negative.  CT of head is negative for acute intracranial abnormalities.  CT of C-spine is negative for acute injury, but showed spondylosis of C3-C4.  Patient is placed on MedSurg bed for observation.   EKG: I have personally reviewed.  Sinus rhythm, QTc 452, early R wave progression, nonspecific T wave change.   Review of Systems:   General: no fevers, chills, has body weight loss, has poor appetite, has fatigue HEENT: no blurry vision, hearing changes or sore throat Respiratory: no dyspnea, coughing, wheezing CV: no chest pain, no palpitations GI: no nausea, vomiting, abdominal pain, diarrhea, constipation GU: no dysuria, burning on urination, increased urinary frequency, hematuria  Ext: no leg edema Neuro: no unilateral weakness, numbness, or tingling, no vision change or hearing loss. Has dizziness and fall Skin: no rash, no skin tear. MSK: No muscle spasm, no deformity, no limitation of range of movement in spin Heme: No easy bruising.  Travel history: No recent long distant travel.   Allergy:  Allergies  Allergen Reactions   Metformin And Related     Upset stomach headaches    Past Medical History:  Diagnosis Date   Diabetes mellitus without complication ([place])    Gout    Hyperlipidemia    Hypertension    Obesity     Past Surgical History:  Procedure Laterality Date   ABDOMINAL HYSTERECTOMY  Social History:  reports that she has never smoked. She has never used smokeless tobacco. She reports that she does not drink alcohol and does not use drugs.  Family History:  Family History  Problem Relation Age of Onset   Kidney disease Mother    Kidney  disease Sister    Stroke Brother    Heart disease Sister    Stroke Sister      Prior to Admission medications   Medication Sig Start Date End Date Taking? Authorizing Provider  brimonidine (ALPHAGAN) 0.2 % ophthalmic solution Place 1 drop into both eyes 2 (two) times daily. 01/01/15   [provider]  Omega-3 Fatty Acids (FISH OIL) 1000 MG CAPS Take 1 capsule by mouth daily.    [provider]  rosuvastatin (CRESTOR) 20 MG tablet TAKE 1 TABLET BY MOUTH EVERYDAY AT BEDTIME 02/14/21   Delsa Grana, PA-C  timolol (TIMOPTIC) 0.5 % ophthalmic solution INSTILL 1 DROP INTO BOTH EYES EVERY MORNING 09/01/15   [provider]  valsartan (DIOVAN) 40 MG tablet Take 0.5 tablets (20 mg total) by mouth daily. 08/25/21   Teodora Medici, DO    Physical Exam: Vitals:   09/08/21 1202 09/08/21 1330 09/08/21 1601 09/08/21 1744  BP: 119/82 (!) 169/82 (!) 147/89 (!) 179/98  Pulse: 84 89 77 81  Resp: '16 13 15 18  '$ Temp:   97.6 F (36.4 C) 97.9 F (36.6 C)  TempSrc:   Oral   SpO2: 99% 96% 98% 100%  Weight:      Height:       General: Not in acute distress.  Dry mucosal membrane HEENT:       Eyes: PERRL, EOMI, no scleral icterus.       ENT: No discharge from the ears and nose, no pharynx injection, no tonsillar enlargement.        Neck: No JVD, no bruit, no mass felt. Heme: No neck lymph node enlargement. Cardiac: S1/S2, RRR, No murmurs, No gallops or rubs. Respiratory: No rales, wheezing, rhonchi or rubs. GI: Soft, nondistended, nontender, no rebound pain, no organomegaly, BS present. GU: No hematuria Ext: No pitting leg edema bilaterally. 1+DP/PT pulse bilaterally. Musculoskeletal: No joint deformities, No joint redness or warmth, no limitation of ROM in spin. Skin: No rashes.  Neuro: Alert, oriented X3, cranial nerves II-XII grossly intact, moves all extremities normally Psych: Patient is not psychotic, no suicidal or hemocidal ideation.  Labs on Admission: I have  personally reviewed following labs and imaging studies  CBC: Recent Labs  Lab 09/08/21 1107  WBC 5.1  HGB 10.2*  HCT 31.2*  MCV 87.4  PLT 703   Basic Metabolic Panel: Recent Labs  Lab 09/08/21 1107  NA 135  K 3.0*  CL 97*  CO2 23  GLUCOSE 100*  BUN 23  CREATININE 1.07*  CALCIUM 9.5  MG 1.7   GFR: Estimated Creatinine Clearance: 36 mL/min (A) (by C-G formula based on SCr of 1.07 mg/dL (H)). Liver Function Tests: Recent Labs  Lab 09/08/21 1107  AST 56*  ALT 21  ALKPHOS 31*  BILITOT 0.8  PROT 6.7  ALBUMIN 3.1*   Recent Labs  Lab 09/08/21 1107  LIPASE 168*   No results for input(s): AMMONIA in the last 168 hours. Coagulation Profile: No results for input(s): INR, PROTIME in the last 168 hours. Cardiac Enzymes: No results for input(s): CKTOTAL, CKMB, CKMBINDEX, TROPONINI in the last 168 hours. BNP (last 3 results) No results for input(s): PROBNP in the last 8760 hours. HbA1C:  No results for input(s): HGBA1C in the last 72 hours. CBG: No results for input(s): GLUCAP in the last 168 hours. Lipid Profile: Recent Labs    09/08/21 1225  TRIG 143   Thyroid Function Tests: No results for input(s): TSH, T4TOTAL, FREET4, T3FREE, THYROIDAB in the last 72 hours. Anemia Panel: No results for input(s): VITAMINB12, FOLATE, FERRITIN, TIBC, IRON, RETICCTPCT in the last 72 hours. Urine analysis:    Component Value Date/Time   COLORURINE YELLOW (A) 07/01/2021 0957   APPEARANCEUR CLEAR (A) 07/01/2021 0957   LABSPEC 1.014 07/01/2021 0957   PHURINE 6.0 07/01/2021 0957   GLUCOSEU NEGATIVE 07/01/2021 0957   HGBUR NEGATIVE 07/01/2021 0957   BILIRUBINUR NEGATIVE 07/01/2021 0957   KETONESUR NEGATIVE 07/01/2021 0957   PROTEINUR NEGATIVE 07/01/2021 0957   NITRITE NEGATIVE 07/01/2021 0957   LEUKOCYTESUR NEGATIVE 07/01/2021 0957   Sepsis Labs: '@LABRCNTIP'$ (procalcitonin:4,lacticidven:4) ) Recent Results (from the past 240 hour(s))  Resp Panel by RT-PCR (Flu A&B, Covid)  Nasopharyngeal Swab     Status: None   Collection Time: 09/08/21  4:01 PM   Specimen: Nasopharyngeal Swab; Nasopharyngeal(NP) swabs in vial transport medium  Result Value Ref Range Status   SARS Coronavirus 2 by RT PCR NEGATIVE NEGATIVE Final    Comment: (NOTE) SARS-CoV-2 target nucleic acids are NOT DETECTED.  The SARS-CoV-2 RNA is generally detectable in upper respiratory specimens during the acute phase of infection. The lowest concentration of SARS-CoV-2 viral copies this assay can detect is 138 copies/mL. A negative result does not preclude SARS-Cov-2 infection and should not be used as the sole basis for treatment or other patient management decisions. A negative result may occur with  improper specimen collection/handling, submission of specimen other than nasopharyngeal swab, presence of viral mutation(s) within the areas targeted by this assay, and inadequate number of viral copies(<138 copies/mL). A negative result must be combined with clinical observations, patient history, and epidemiological information. The expected result is Negative.  Fact Sheet for Patients:  EntrepreneurPulse.com.au  Fact Sheet for Healthcare Providers:  IncredibleEmployment.be  This test is no t yet approved or cleared by the [place] FDA and  has been authorized for detection and/or diagnosis of SARS-CoV-2 by FDA under an Emergency Use Authorization (EUA). This EUA will remain  in effect (meaning this test can be used) for the duration of the COVID-19 declaration under Section 564(b)(1) of the Act, 21 U.S.C.section 360bbb-3(b)(1), unless the authorization is terminated  or revoked sooner.       Influenza A by PCR NEGATIVE NEGATIVE Final   Influenza B by PCR NEGATIVE NEGATIVE Final    Comment: (NOTE) The Xpert Xpress SARS-CoV-2/FLU/RSV plus assay is intended as an aid in the diagnosis of influenza from Nasopharyngeal swab specimens and should not be  used as a sole basis for treatment. Nasal washings and aspirates are unacceptable for Xpert Xpress SARS-CoV-2/FLU/RSV testing.  Fact Sheet for Patients: EntrepreneurPulse.com.au  Fact Sheet for Healthcare Providers: IncredibleEmployment.be  This test is not yet approved or cleared by the [place] FDA and has been authorized for detection and/or diagnosis of SARS-CoV-2 by FDA under an Emergency Use Authorization (EUA). This EUA will remain in effect (meaning this test can be used) for the duration of the COVID-19 declaration under Section 564(b)(1) of the Act, 21 U.S.C. section 360bbb-3(b)(1), unless the authorization is terminated or revoked.  Performed at [facility], [address], [place], [place] [address]      Radiological Exams on Admission: DG Chest 2 View  Result Date: 09/08/2021  CLINICAL DATA:  Dizziness, fall EXAM: CHEST - 2 VIEW COMPARISON:  07/01/2021 FINDINGS: The heart size and mediastinal contours are within normal limits. Both lungs are clear. The visualized skeletal structures are unremarkable. IMPRESSION: No active cardiopulmonary disease. Electronically Signed   By: Elmer Picker M.D.   On: 09/08/2021 12:58   CT HEAD WO CONTRAST (5MM)  Result Date: 09/08/2021 CLINICAL DATA:  Dizziness and fall. EXAM: CT HEAD WITHOUT CONTRAST CT CERVICAL SPINE WITHOUT CONTRAST TECHNIQUE: Multidetector CT imaging of the head and cervical spine was performed following the standard protocol without intravenous contrast. Multiplanar CT image reconstructions of the cervical spine were also generated. RADIATION DOSE REDUCTION: This exam was performed according to the departmental dose-optimization program which includes automated exposure control, adjustment of the mA and/or kV according to patient size and/or use of iterative reconstruction technique. COMPARISON:  CT head dated June 26, 2021. FINDINGS: CT HEAD FINDINGS Brain: No  evidence of acute infarction, hemorrhage, hydrocephalus, extra-axial collection or mass lesion/mass effect. Stable mild atrophy and chronic microvascular ischemic changes. Old lacunar infarct in the left thalamus again noted. Vascular: Calcified atherosclerosis at the skull base. No hyperdense vessel. Skull: Negative for fracture or focal lesion. Sinuses/Orbits: No acute finding. Unchanged partial opacification of the right mastoid air cells. Other: None. CT CERVICAL SPINE FINDINGS Alignment: No traumatic malalignment. Straightening of the normal cervical lordosis with trace retrolisthesis at C3-C4. Skull base and vertebrae: No acute fracture. No primary bone lesion or focal pathologic process. Soft tissues and spinal canal: No prevertebral fluid or swelling. No visible canal hematoma. Disc levels: Moderate to severe disc height loss and bilateral uncovertebral hypertrophy at C3-C4. Scattered mild facet arthropathy throughout the cervical spine, greater on the left. Upper chest: Negative. Other: None. IMPRESSION: 1. No acute intracranial abnormality. Stable mild atrophy and chronic microvascular ischemic changes. 2. No acute cervical spine fracture or traumatic listhesis. 3. Moderate to severe cervical spondylosis at C3-C4. Electronically Signed   By: Titus Dubin M.D.   On: 09/08/2021 12:54   CT Cervical Spine Wo Contrast  Result Date: 09/08/2021 CLINICAL DATA:  Dizziness and fall. EXAM: CT HEAD WITHOUT CONTRAST CT CERVICAL SPINE WITHOUT CONTRAST TECHNIQUE: Multidetector CT imaging of the head and cervical spine was performed following the standard protocol without intravenous contrast. Multiplanar CT image reconstructions of the cervical spine were also generated. RADIATION DOSE REDUCTION: This exam was performed according to the departmental dose-optimization program which includes automated exposure control, adjustment of the mA and/or kV according to patient size and/or use of iterative reconstruction  technique. COMPARISON:  CT head dated June 26, 2021. FINDINGS: CT HEAD FINDINGS Brain: No evidence of acute infarction, hemorrhage, hydrocephalus, extra-axial collection or mass lesion/mass effect. Stable mild atrophy and chronic microvascular ischemic changes. Old lacunar infarct in the left thalamus again noted. Vascular: Calcified atherosclerosis at the skull base. No hyperdense vessel. Skull: Negative for fracture or focal lesion. Sinuses/Orbits: No acute finding. Unchanged partial opacification of the right mastoid air cells. Other: None. CT CERVICAL SPINE FINDINGS Alignment: No traumatic malalignment. Straightening of the normal cervical lordosis with trace retrolisthesis at C3-C4. Skull base and vertebrae: No acute fracture. No primary bone lesion or focal pathologic process. Soft tissues and spinal canal: No prevertebral fluid or swelling. No visible canal hematoma. Disc levels: Moderate to severe disc height loss and bilateral uncovertebral hypertrophy at C3-C4. Scattered mild facet arthropathy throughout the cervical spine, greater on the left. Upper chest: Negative. Other: None. IMPRESSION: 1. No acute intracranial abnormality. Stable mild atrophy and  chronic microvascular ischemic changes. 2. No acute cervical spine fracture or traumatic listhesis. 3. Moderate to severe cervical spondylosis at C3-C4. Electronically Signed   By: Titus Dubin M.D.   On: 09/08/2021 12:54      Assessment/Plan Principal Problem:   Dizziness Active Problems:   Abnormal LFTs   Elevated lipase   Essential hypertension   Hyperlipidemia   Chronic kidney disease, stage 3a ([place])   Fall at home, initial encounter   Hypokalemia   Protein-calorie malnutrition, moderate ([facility])   Weight loss, unintentional  Dizziness and lightheadedness: Possibly due to dehydration.  Patient moves all extremities normally.  No focal neurodeficit on physical examination, CT head negative.  Low suspicions for stroke.  -Placed on  MedSurg bed for position -Check orthostatic vital sign -IV fluid: 500 cc LR, followed by 75 cc/h  Abnormal LFTs and elevated lipase: Etiology is not clear.  Patient does not have abdominal pain, nausea, vomiting or diarrhea.  CT scan on 2/23 showed intrahepatic biliary ductal dilation. -Check TG level --> 72 -MRCP  Essential hypertension -IV hydralazine as needed -Switch Diovan to irbesartan in hospital  Hyperlipidemia -[place]  Chronic kidney disease, stage 3a ([place]): Stable -Follow-up with BMP  Fall at home, initial encounter -Fall precaution -PT/OT  Hypokalemia: Potassium 3.0, magnesium 1.7 -Repleted potassium  Protein-calorie malnutrition, moderate and weight loss, unintentional: -Consulted nutrition  Possible esophagitis: CT scan showed wall thickening of the distal esophagus, suspicious for esophagitis or reflux. Mild pre pyloric gastric wall thickening versus peristalsis.  -Protonix 40 mg daily -will need to f/u with GI for EGD to r/o malignancy         DVT ppx: SQ Heparin     Code Status: Full code  Family Communication:  Yes, patient's son and husband   at bed side.     Disposition Plan:  Anticipate discharge back to previous environment  Consults called:  none  Admission status and Level of care: Med-Surg:   for obs    Severity of Illness:  The appropriate patient status for this patient is OBSERVATION. Observation status is judged to be reasonable and necessary in order to provide the required intensity of service to ensure the patient's safety. The patient's presenting symptoms, physical exam findings, and initial radiographic and laboratory data in the context of their medical condition is felt to place them at decreased risk for further clinical deterioration. Furthermore, it is anticipated that the patient will be medically stable for discharge from the hospital within 2 midnights of admission.        Date of Service 09/08/2021    Ivor Costa [facility]   If 7PM-7AM, please contact night-coverage [email or web address] 09/08/2021, 5:51 PM

## 2021-09-08 NOTE — ED Triage Notes (Signed)
Pt comes into the ED via POV c/o dizziness that caused a fall.  Pt did hit the front side of her head.  Pt denies any blood thinners or LOC.  Pt denies any pain.  Pt has been off balance more than normal per the family prior to the fall.  Pt in NAD with even and unlabored respirations.  ?

## 2021-09-08 NOTE — Telephone Encounter (Signed)
?  Chief Complaint: Pt fell this morning and not able to make it to appt at 11:20 this morning.   Family calling 911. ?Symptoms: Caller was not with pt so unable to tell me details other than she can't stand and is very weak. ?Frequency: Fall happened this morning ?Pertinent Negatives: Patient denies being able to get her clothes on or get ready to come to the dr. Appt. ?Disposition: '[x]'$ ED /'[]'$ Urgent Care (no appt availability in office) / '[]'$ Appointment(In office/virtual)/ '[]'$  [place] [facility]/ '[]'$ Home Care/ '[]'$ Refused Recommended Disposition /'[]'$ [place] [facility]/ '[]'$  Follow-up with PCP ?Additional Notes: Daughter in law, Coralyn Mark agreeable to calling 911.   I will cancel the dr. Appt for this morning at [facility]  ?

## 2021-09-09 DIAGNOSIS — E876 Hypokalemia: Secondary | ICD-10-CM | POA: Diagnosis not present

## 2021-09-09 DIAGNOSIS — Z79899 Other long term (current) drug therapy: Secondary | ICD-10-CM | POA: Diagnosis not present

## 2021-09-09 DIAGNOSIS — I129 Hypertensive chronic kidney disease with stage 1 through stage 4 chronic kidney disease, or unspecified chronic kidney disease: Secondary | ICD-10-CM | POA: Diagnosis not present

## 2021-09-09 DIAGNOSIS — K449 Diaphragmatic hernia without obstruction or gangrene: Secondary | ICD-10-CM | POA: Diagnosis not present

## 2021-09-09 DIAGNOSIS — K2940 Chronic atrophic gastritis without bleeding: Secondary | ICD-10-CM | POA: Diagnosis not present

## 2021-09-09 DIAGNOSIS — E785 Hyperlipidemia, unspecified: Secondary | ICD-10-CM | POA: Diagnosis not present

## 2021-09-09 DIAGNOSIS — Z90710 Acquired absence of both cervix and uterus: Secondary | ICD-10-CM | POA: Diagnosis not present

## 2021-09-09 DIAGNOSIS — N1831 Chronic kidney disease, stage 3a: Secondary | ICD-10-CM | POA: Diagnosis not present

## 2021-09-09 DIAGNOSIS — M109 Gout, unspecified: Secondary | ICD-10-CM | POA: Diagnosis not present

## 2021-09-09 DIAGNOSIS — K2289 Other specified disease of esophagus: Secondary | ICD-10-CM | POA: Diagnosis not present

## 2021-09-09 DIAGNOSIS — Z8249 Family history of ischemic heart disease and other diseases of the circulatory system: Secondary | ICD-10-CM | POA: Diagnosis not present

## 2021-09-09 DIAGNOSIS — Z823 Family history of stroke: Secondary | ICD-10-CM | POA: Diagnosis not present

## 2021-09-09 DIAGNOSIS — E860 Dehydration: Secondary | ICD-10-CM | POA: Diagnosis not present

## 2021-09-09 DIAGNOSIS — R54 Age-related physical debility: Secondary | ICD-10-CM | POA: Diagnosis not present

## 2021-09-09 DIAGNOSIS — Z681 Body mass index (BMI) 19 or less, adult: Secondary | ICD-10-CM | POA: Diagnosis not present

## 2021-09-09 DIAGNOSIS — K222 Esophageal obstruction: Secondary | ICD-10-CM | POA: Diagnosis not present

## 2021-09-09 DIAGNOSIS — I700 Atherosclerosis of aorta: Secondary | ICD-10-CM | POA: Diagnosis not present

## 2021-09-09 DIAGNOSIS — R42 Dizziness and giddiness: Secondary | ICD-10-CM | POA: Diagnosis not present

## 2021-09-09 DIAGNOSIS — E440 Moderate protein-calorie malnutrition: Secondary | ICD-10-CM | POA: Diagnosis not present

## 2021-09-09 DIAGNOSIS — Z20822 Contact with and (suspected) exposure to covid-19: Secondary | ICD-10-CM | POA: Diagnosis not present

## 2021-09-09 DIAGNOSIS — R634 Abnormal weight loss: Secondary | ICD-10-CM | POA: Diagnosis not present

## 2021-09-09 DIAGNOSIS — E1122 Type 2 diabetes mellitus with diabetic chronic kidney disease: Secondary | ICD-10-CM | POA: Diagnosis not present

## 2021-09-09 LAB — BASIC METABOLIC PANEL
Anion gap: 10 (ref 5–15)
BUN: 21 mg/dL (ref 8–23)
CO2: 22 mmol/L (ref 22–32)
Calcium: 8.8 mg/dL — ABNORMAL LOW (ref 8.9–10.3)
Chloride: 105 mmol/L (ref 98–111)
Creatinine, Ser: 1.02 mg/dL — ABNORMAL HIGH (ref 0.44–1.00)
GFR, Estimated: 55 mL/min — ABNORMAL LOW (ref >60)
Glucose, Bld: 78 mg/dL (ref 70–99)
Potassium: 3.3 mmol/L — ABNORMAL LOW (ref 3.5–5.1)
Sodium: 137 mmol/L (ref 135–145)

## 2021-09-09 LAB — TSH: TSH: 2.9 u[IU]/mL (ref 0.350–4.500)

## 2021-09-09 LAB — GLUCOSE, CAPILLARY: Glucose-Capillary: 77 mg/dL (ref 70–99)

## 2021-09-09 MED ORDER — LACTATED RINGERS IV SOLN
INTRAVENOUS | Status: AC
Start: 2021-09-09 — End: 2021-09-10

## 2021-09-09 MED ORDER — SODIUM CHLORIDE 0.9 % IV BOLUS
1000.0000 mL | Freq: Once | INTRAVENOUS | Status: AC
  Administered 2021-09-09: 1000 mL via INTRAVENOUS

## 2021-09-09 MED ORDER — ENSURE ENLIVE PO LIQD
237.0000 mL | Freq: Two times a day (BID) | ORAL | Status: DC
  Administered 2021-09-10: 237 mL via ORAL

## 2021-09-09 MED ORDER — MAGNESIUM SULFATE 2 GM/50ML IV SOLN
2.0000 g | Freq: Once | INTRAVENOUS | Status: AC
  Administered 2021-09-09: 2 g via INTRAVENOUS
  Filled 2021-09-09: qty 50

## 2021-09-09 MED ORDER — POTASSIUM CHLORIDE CRYS ER 20 MEQ PO TBCR
40.0000 meq | EXTENDED_RELEASE_TABLET | Freq: Once | ORAL | Status: AC
  Administered 2021-09-09: 40 meq via ORAL
  Filled 2021-09-09: qty 2

## 2021-09-09 MED ORDER — PEG 3350-KCL-NA BICARB-NACL 420 G PO SOLR
4000.0000 mL | Freq: Once | ORAL | Status: AC
  Administered 2021-09-09: 4000 mL via ORAL
  Filled 2021-09-09: qty 4000

## 2021-09-09 MED ORDER — SODIUM CHLORIDE 0.9 % IV SOLN: INTRAVENOUS | Status: DC

## 2021-09-09 MED ORDER — PEG 3350-KCL-NA BICARB-NACL 420 G PO SOLR
4000.0000 mL | Freq: Once | ORAL | Status: DC
  Filled 2021-09-09: qty 4000

## 2021-09-09 NOTE — Consult Note (Signed)
Jonathon Bellows , MD [address] [place], [place], [place], [address] [id number], [place], [place], [place], [address] Phone: [phone]  Fax: [phone]  Consultation  Referring Provider:    Dr Reesa Chew  Primary Care Physician:  Delsa Grana, PA-C Primary Gastroenterologist:  None          Reason for Consultation:     ERCP  Date of Admission:  09/08/2021 Date of Consultation:  09/09/2021         HPI:   Angela Horton is a 81 y.o. female presented to the hospital with weakness, diziness and a fall.   She had a CT of her abdomen ordered by her PCP on 08/25/2021 for abdominal bloating , loss of appetite and unintentional weight loss . CT showed wall thickening of the distal esophagus suggestuve of esophagitis , mild intrahepatic biliary dilation without CBD dilation. No evidence of a mass, hepatic steatosis.   Subsequently had a MRCP that was reported today that showed mild intra and extra biliary dilation > ampullary lesion in pancreatic head-EUS recommended   On admission Hb 10.2 grams , LFT's no significant abnormality .    She denies any abdominal pain , dysphagia, diarrhea, change in bowel habits. Her only complaint is very poor appetite, over 40 lbs weight loss. Last colonoscopy was many years back. Does not have the desire to eat any food.  Past Medical History:  Diagnosis Date   Diabetes mellitus without complication ([facility])    Gout    Hyperlipidemia    Hypertension    Obesity     Past Surgical History:  Procedure Laterality Date   ABDOMINAL HYSTERECTOMY      Prior to Admission medications   Medication Sig Start Date End Date Taking? Authorizing Provider  brimonidine (ALPHAGAN) 0.2 % ophthalmic solution Place 1 drop into both eyes 2 (two) times daily. 01/01/15  Yes [provider]  Multiple Vitamins-Minerals (ONE-A-DAY WOMENS PETITES) TABS Take 1 tablet by mouth in the morning and at bedtime.   Yes [provider]  Omega-3 Fatty Acids (FISH OIL)  1000 MG CAPS Take 1 capsule by mouth daily.   Yes [provider]  rosuvastatin (CRESTOR) 20 MG tablet TAKE 1 TABLET BY MOUTH EVERYDAY AT BEDTIME Patient taking differently: Take 20 mg by mouth at bedtime. 02/14/21  Yes Delsa Grana, PA-C  valsartan (DIOVAN) 40 MG tablet Take 0.5 tablets (20 mg total) by mouth daily. 08/25/21  Yes Teodora Medici, DO  amLODipine (NORVASC) 10 MG tablet Take 10 mg by mouth daily. Patient not taking: Reported on 09/08/2021 09/06/21   [provider]  timolol (TIMOPTIC) 0.5 % ophthalmic solution Place 1 drop into both eyes in the morning. Patient not taking: Reported on 09/08/2021 09/01/15   [provider]    Family History  Problem Relation Age of Onset   Kidney disease Mother    Kidney disease Sister    Stroke Brother    Heart disease Sister    Stroke Sister      Social History   Tobacco Use   Smoking status: Never   Smokeless tobacco: Never   Tobacco comments:    Smoking cessation materials not required  Vaping Use   Vaping Use: Never used  Substance Use Topics   Alcohol use: No    Alcohol/week: 0.0 standard drinks   Drug use: No    Allergies as of 09/08/2021 - Review Complete 09/08/2021  Allergen Reaction Noted   Metformin and related  11/30/2017  Review of Systems:    All systems reviewed and negative except where noted in HPI.   Physical Exam:  Vital signs in last 24 hours: Temp:  [97.6 F (36.4 C)-98.8 F (37.1 C)] 98.6 F (37 C) (03/10 0743) Pulse Rate:  [73-93] 91 (03/10 0743) Resp:  [13-18] 18 (03/10 0743) BP: (91-179)/(58-98) 91/58 (03/10 0743) SpO2:  [94 %-100 %] 100 % (03/10 0743) Last BM Date : 09/08/21 General: frail Head:  Normocephalic and atraumatic. Eyes:   No icterus.   Conjunctiva pink. PERRLA. Ears:  Normal auditory acuity. Neck:  Supple; no masses or thyroidomegaly Lungs: Respirations even and unlabored. Lungs clear to auscultation bilaterally.   No wheezes, crackles, or rhonchi.   Heart:  Regular rate and rhythm;  Without murmur, clicks, rubs or gallops Abdomen:  Soft, nondistended, nontender. Normal bowel sounds. No appreciable masses or hepatomegaly.  No rebound or guarding.  Neurologic:  Alert and oriented x3;  grossly normal neurologically. Skin:  Intact without significant lesions or rashes. Cervical Nodes:  No significant cervical adenopathy. Psych:  Alert and cooperative. Normal affect.  LAB RESULTS: Recent Labs    09/08/21 1107  WBC 5.1  HGB 10.2*  HCT 31.2*  PLT 278   BMET Recent Labs    09/08/21 1107 09/09/21 0534  NA 135 137  K 3.0* 3.3*  CL 97* 105  CO2 23 22  GLUCOSE 100* 78  BUN 23 21  CREATININE 1.07* 1.02*  CALCIUM 9.5 8.8*   LFT Recent Labs    09/08/21 1107  PROT 6.7  ALBUMIN 3.1*  AST 56*  ALT 21  ALKPHOS 31*  BILITOT 0.8  BILIDIR 0.2  IBILI 0.6   PT/INR No results for input(s): LABPROT, INR in the last 72 hours.  STUDIES: DG Chest 2 View  Result Date: 09/08/2021 CLINICAL DATA:  Dizziness, fall EXAM: CHEST - 2 VIEW COMPARISON:  07/01/2021 FINDINGS: The heart size and mediastinal contours are within normal limits. Both lungs are clear. The visualized skeletal structures are unremarkable. IMPRESSION: No active cardiopulmonary disease. Electronically Signed   By: Elmer Picker M.D.   On: 09/08/2021 12:58   CT HEAD WO CONTRAST (5MM)  Result Date: 09/08/2021 CLINICAL DATA:  Dizziness and fall. EXAM: CT HEAD WITHOUT CONTRAST CT CERVICAL SPINE WITHOUT CONTRAST TECHNIQUE: Multidetector CT imaging of the head and cervical spine was performed following the standard protocol without intravenous contrast. Multiplanar CT image reconstructions of the cervical spine were also generated. RADIATION DOSE REDUCTION: This exam was performed according to the departmental dose-optimization program which includes automated exposure control, adjustment of the mA and/or kV according to patient size and/or use of iterative reconstruction  technique. COMPARISON:  CT head dated June 26, 2021. FINDINGS: CT HEAD FINDINGS Brain: No evidence of acute infarction, hemorrhage, hydrocephalus, extra-axial collection or mass lesion/mass effect. Stable mild atrophy and chronic microvascular ischemic changes. Old lacunar infarct in the left thalamus again noted. Vascular: Calcified atherosclerosis at the skull base. No hyperdense vessel. Skull: Negative for fracture or focal lesion. Sinuses/Orbits: No acute finding. Unchanged partial opacification of the right mastoid air cells. Other: None. CT CERVICAL SPINE FINDINGS Alignment: No traumatic malalignment. Straightening of the normal cervical lordosis with trace retrolisthesis at C3-C4. Skull base and vertebrae: No acute fracture. No primary bone lesion or focal pathologic process. Soft tissues and spinal canal: No prevertebral fluid or swelling. No visible canal hematoma. Disc levels: Moderate to severe disc height loss and bilateral uncovertebral hypertrophy at C3-C4. Scattered mild facet arthropathy throughout the cervical spine,  greater on the left. Upper chest: Negative. Other: None. IMPRESSION: 1. No acute intracranial abnormality. Stable mild atrophy and chronic microvascular ischemic changes. 2. No acute cervical spine fracture or traumatic listhesis. 3. Moderate to severe cervical spondylosis at C3-C4. Electronically Signed   By: Titus Dubin M.D.   On: 09/08/2021 12:54   CT Cervical Spine Wo Contrast  Result Date: 09/08/2021 CLINICAL DATA:  Dizziness and fall. EXAM: CT HEAD WITHOUT CONTRAST CT CERVICAL SPINE WITHOUT CONTRAST TECHNIQUE: Multidetector CT imaging of the head and cervical spine was performed following the standard protocol without intravenous contrast. Multiplanar CT image reconstructions of the cervical spine were also generated. RADIATION DOSE REDUCTION: This exam was performed according to the departmental dose-optimization program which includes automated exposure control,  adjustment of the mA and/or kV according to patient size and/or use of iterative reconstruction technique. COMPARISON:  CT head dated June 26, 2021. FINDINGS: CT HEAD FINDINGS Brain: No evidence of acute infarction, hemorrhage, hydrocephalus, extra-axial collection or mass lesion/mass effect. Stable mild atrophy and chronic microvascular ischemic changes. Old lacunar infarct in the left thalamus again noted. Vascular: Calcified atherosclerosis at the skull base. No hyperdense vessel. Skull: Negative for fracture or focal lesion. Sinuses/Orbits: No acute finding. Unchanged partial opacification of the right mastoid air cells. Other: None. CT CERVICAL SPINE FINDINGS Alignment: No traumatic malalignment. Straightening of the normal cervical lordosis with trace retrolisthesis at C3-C4. Skull base and vertebrae: No acute fracture. No primary bone lesion or focal pathologic process. Soft tissues and spinal canal: No prevertebral fluid or swelling. No visible canal hematoma. Disc levels: Moderate to severe disc height loss and bilateral uncovertebral hypertrophy at C3-C4. Scattered mild facet arthropathy throughout the cervical spine, greater on the left. Upper chest: Negative. Other: None. IMPRESSION: 1. No acute intracranial abnormality. Stable mild atrophy and chronic microvascular ischemic changes. 2. No acute cervical spine fracture or traumatic listhesis. 3. Moderate to severe cervical spondylosis at C3-C4. Electronically Signed   By: Titus Dubin M.D.   On: 09/08/2021 12:54   MR 3D Recon At Scanner  Result Date: 09/09/2021 CLINICAL DATA:  81 year old female presenting with signs and symptoms concerning for biliary obstruction. EXAM: MRI ABDOMEN WITHOUT AND WITH CONTRAST (INCLUDING MRCP) TECHNIQUE: Multiplanar multisequence MR imaging of the abdomen was performed both before and after the administration of intravenous contrast. Heavily T2-weighted images of the biliary and pancreatic ducts were obtained,  and three-dimensional MRCP images were rendered by post processing. CONTRAST:  79m GADAVIST GADOBUTROL 1 MMOL/ML IV SOLN COMPARISON:  No prior abdominal MRI. FINDINGS: Lower chest: Unremarkable. Hepatobiliary: Mild diffuse loss of signal intensity throughout the hepatic parenchyma on out of phase dual echo images, indicative of a background of mild hepatic steatosis. No suspicious cystic or solid hepatic lesions. Gallbladder is normal in appearance. No filling defects within the gallbladder to suggest gallstones. There is very mild prominence of the intrahepatic biliary tree, and mild dilatation of the common bile duct on MRCP images, measuring up to 9 mm in the porta hepatis. The duct smoothly tapers distally, without evidence of choledocholithiasis or other obstructing lesion. These findings are favored to be age related. Pancreas: In the inferior aspect of the pancreatic head in the expected location of the ampulla (axial image 23 of series 3 and axial images 51 of series 19 and 21) there is a subtle area of low T2 signal intensity which appears slightly hypovascular on delayed post gadolinium imaging. No definitive diffusion restriction confidently identified in this region. No other definite pancreatic mass.  No pancreatic ductal dilatation. No pancreatic or peripancreatic fluid collections or inflammatory changes. Spleen:  Unremarkable. Adrenals/Urinary Tract: Mild multifocal cortical thinning in both kidneys. No suspicious renal lesions. No hydroureteronephrosis in the visualized portions of the abdomen. Bilateral adrenal glands are normal in appearance. Stomach/Bowel: Visualized portions are unremarkable. Vascular/Lymphatic: No aneurysm identified in the visualized abdominal vasculature. No lymphadenopathy noted in the abdomen. Other: No significant volume of ascites noted in the visualized portions of the peritoneal cavity. Musculoskeletal: No aggressive appearing osseous lesions are noted in the visualized  portions of the skeleton. IMPRESSION: 1. There is very mild intra and extrahepatic biliary ductal dilatation, which could be within normal limits given the patient's advanced age. However, there are findings which could be indicative of a very subtle ampullary lesion in the pancreatic head, as discussed above. This is not associated with pancreatic ductal dilatation. However, correlation with ERCP and endoscopic ultrasound should be considered if clinically appropriate. 2. Mild cortical thinning in both kidneys. Electronically Signed   By: Vinnie Langton M.D.   On: 09/09/2021 07:44   MR ABDOMEN MRCP W WO CONTAST  Result Date: 09/09/2021 CLINICAL DATA:  81 year old female presenting with signs and symptoms concerning for biliary obstruction. EXAM: MRI ABDOMEN WITHOUT AND WITH CONTRAST (INCLUDING MRCP) TECHNIQUE: Multiplanar multisequence MR imaging of the abdomen was performed both before and after the administration of intravenous contrast. Heavily T2-weighted images of the biliary and pancreatic ducts were obtained, and three-dimensional MRCP images were rendered by post processing. CONTRAST:  7m GADAVIST GADOBUTROL 1 MMOL/ML IV SOLN COMPARISON:  No prior abdominal MRI. FINDINGS: Lower chest: Unremarkable. Hepatobiliary: Mild diffuse loss of signal intensity throughout the hepatic parenchyma on out of phase dual echo images, indicative of a background of mild hepatic steatosis. No suspicious cystic or solid hepatic lesions. Gallbladder is normal in appearance. No filling defects within the gallbladder to suggest gallstones. There is very mild prominence of the intrahepatic biliary tree, and mild dilatation of the common bile duct on MRCP images, measuring up to 9 mm in the porta hepatis. The duct smoothly tapers distally, without evidence of choledocholithiasis or other obstructing lesion. These findings are favored to be age related. Pancreas: In the inferior aspect of the pancreatic head in the expected  location of the ampulla (axial image 23 of series 3 and axial images 51 of series 19 and 21) there is a subtle area of low T2 signal intensity which appears slightly hypovascular on delayed post gadolinium imaging. No definitive diffusion restriction confidently identified in this region. No other definite pancreatic mass. No pancreatic ductal dilatation. No pancreatic or peripancreatic fluid collections or inflammatory changes. Spleen:  Unremarkable. Adrenals/Urinary Tract: Mild multifocal cortical thinning in both kidneys. No suspicious renal lesions. No hydroureteronephrosis in the visualized portions of the abdomen. Bilateral adrenal glands are normal in appearance. Stomach/Bowel: Visualized portions are unremarkable. Vascular/Lymphatic: No aneurysm identified in the visualized abdominal vasculature. No lymphadenopathy noted in the abdomen. Other: No significant volume of ascites noted in the visualized portions of the peritoneal cavity. Musculoskeletal: No aggressive appearing osseous lesions are noted in the visualized portions of the skeleton. IMPRESSION: 1. There is very mild intra and extrahepatic biliary ductal dilatation, which could be within normal limits given the patient's advanced age. However, there are findings which could be indicative of a very subtle ampullary lesion in the pancreatic head, as discussed above. This is not associated with pancreatic ductal dilatation. However, correlation with ERCP and endoscopic ultrasound should be considered if clinically appropriate. 2. Mild  cortical thinning in both kidneys. Electronically Signed   By: Vinnie Langton M.D.   On: 09/09/2021 07:44      Impression / Plan:   Angela Horton is a 81 y.o. y/o female admitted with falls , weight loss and apetite. CT shows concerns for esophageal thickening possibly esophagitis , some biliary diltion. MRCP shows intra and extra hepatic biliary dilation , cannot rule out ampullary lesion in pancreatic head and  hence EUS+/- ERCP recommended . She has very poor apetite and has lost over 40 lbs weight . Looks very frail and tired.   Plan  Unintentional weight loss with abnormality seen in esophagus on CT : Plan for EGD+colonoscopy on Sunday with Dr Marius Ditch . I will start bowel prep today and hopefully she will complete a good part tomorrow and rest by day after early morning and be ready for her procedures.  She will need EUS as an outpatient to evaluate ampullary abnormality if nothing seen on EGD on Sunday  Check TSH  I have discussed alternative options, risks & benefits,  which include, but are not limited to, bleeding, infection, perforation,respiratory complication & drug reaction.  The patient agrees with this plan & written consent will be obtained.      Thank you for involving me in the care of this patient.      LOS: 0 days   Jonathon Bellows, MD  09/09/2021, 12:22 PM

## 2021-09-09 NOTE — Assessment & Plan Note (Signed)
Concern of malignancy with some lower esophageal thickening on CT abdomen and a small lesion in ampulla of pancreas which require further investigation. ?GI is on board-patient will be going for EGD and colonoscopy in a day. ?She will also need EUS which can be done as an outpatient. ?-Ordered CA-19 and CEA levels ?

## 2021-09-09 NOTE — Assessment & Plan Note (Signed)
Estimated body mass index is 18.87 kg/m? as calculated from the following: ?  Height as of this encounter: '5\' 8"'$  (1.727 m). ?  Weight as of this encounter: 56.3 kg.  ? ?History of unintentional weight loss. ?-Dietitian consult ?

## 2021-09-09 NOTE — Evaluation (Signed)
Occupational Therapy Evaluation Patient Details Name: Angela Horton MRN: [id number] DOB: [birth year] Today's Date: 09/09/2021   History of Present Illness Angela Horton. Vanpelt is an 81 y.o. female who presents to the ED with complaints of generalized weakness, falls, loss of appetite, and epigastric discomfort. Pt family reports recent significant weight loss (>40 lbs). PMH includes: hypertension, hyperlipidemia, diet-controlled diabetes, gout, CKD-3A, mild cognitive impairment.   Clinical Impression   Angela Horton was seen for OT evaluation this date. Prior to hospital admission, pt was generally independent with ADL/IADL management, however her son reports she frequently will go long periods of time without eating and afterward become dizzy and require assistance with functional mobility. Pt lives with her spouse in a 1-level home with ~2 STE. Currently pt demonstrates impairments as described below (See OT problem list) which functionally limit her ability to perform ADL/self-care tasks. Pt currently requires SUPERVISION for bed/functional mobility, MIN A For Sit to stand/standing ADLs, and SET-UP/SUPERVISION for feeding.  Pt would benefit from skilled OT services to address noted impairments and functional limitations (see below for any additional details) in order to maximize safety and independence while minimizing falls risk and caregiver burden. Upon hospital discharge, recommend HHOT to maximize pt safety and return to functional independence during meaningful occupations of daily life.       Recommendations for follow up therapy are one component of a multi-disciplinary discharge planning process, led by the attending physician.  Recommendations may be updated based on patient status, additional functional criteria and insurance authorization.   Follow Up Recommendations  Home health OT    Assistance Recommended at Discharge Intermittent Supervision/Assistance  Patient can return home with  the following A little help with walking and/or transfers;A little help with bathing/dressing/bathroom;Assistance with feeding;Assistance with cooking/housework;Help with stairs or ramp for entrance    Functional Status Assessment  Patient has had a recent decline in their functional status and demonstrates the ability to make significant improvements in function in a reasonable and predictable amount of time.  Equipment Recommendations  BSC/3in1 Librarian, academic)    Recommendations for Other Services       Precautions / Restrictions Precautions Precautions: Fall Restrictions Weight Bearing Restrictions: No      Mobility Bed Mobility Overal bed mobility: Needs Assistance Bed Mobility: Supine to Sit, Sit to Supine     Supine to sit: Supervision, HOB elevated Sit to supine: Supervision (returns to supine in flat bed without assist.)        Transfers Overall transfer level: Needs assistance   Transfers: Sit to/from Stand Sit to Stand: Min guard, Supervision           General transfer comment: Progresses to supervision with STS and side-stepping at EOB. Pt declines further functional mobility this date.      Balance Overall balance assessment: Needs assistance Sitting-balance support: Feet supported Sitting balance-Leahy Scale: Good Sitting balance - Comments: steady sitting with dynamic tasks at EOB, no UE support.   Standing balance support: During functional activity, Bilateral upper extremity supported Standing balance-Leahy Scale: Fair                             ADL either performed or assessed with clinical judgement   ADL Overall ADL's : Needs assistance/impaired  General ADL Comments: Pt is functionally limited by generalized weakness, decreased activity tolerance, increased dizziness with mobility, and decreased safety awareness. She is able to demonstrate LB access WFL to adjust socks but  declines to put on socks 2/2 dizziness/fatigue this date. Perfomrs bed/functional mobility with supervision for safety and cueing for hand/foot placement. Anticipate MIN A for LB ADL management, Close supervision for STS/standing ADLs.     Vision Baseline Vision/History: 1 Wears glasses Ability to See in Adequate Light: 1 Impaired Patient Visual Report: No change from baseline       Perception     Praxis      Pertinent Vitals/Pain Pain Assessment Pain Assessment: No/denies pain     Hand Dominance Left   Extremity/Trunk Assessment Upper Extremity Assessment Upper Extremity Assessment: Generalized weakness   Lower Extremity Assessment Lower Extremity Assessment: Generalized weakness       Communication Communication Communication: No difficulties   Cognition Arousal/Alertness: Awake/alert Behavior During Therapy: WFL for tasks assessed/performed, Flat affect Overall Cognitive Status: History of cognitive impairments - at baseline                                 General Comments: Pt frequently asks to return to bed and c/o being tired. Easily re-directed to task/topic at hand. Oriented to self, place, and limited situation.     General Comments  SpO2 monitored and WNL t/o session. HR remains in upper 90's-100's with minimal functional activity.    Exercises Other Exercises Other Exercises: Pt educated on role of OT in acute setting, safe transfer technique, falls prevention, and safe use of AE/DME for ADL management during functional tasks.   Shoulder Instructions      Home Living Family/patient expects to be discharged to:: Private residence Living Arrangements: Spouse/significant other Available Help at Discharge: Family;Available 24 hours/day Type of Home: House Home Access: Stairs to enter   Entrance Stairs-Rails: Right;Left;Can reach both Home Layout: One level     Bathroom Shower/Tub: Tub/shower unit;Curtain   Biochemist, clinical: Standard      Home Equipment: Cane - quad          Prior Functioning/Environment Prior Level of Function : Needs assist       Physical Assist : Mobility (physical) Mobility (physical): Transfers ADLs (physical): IADLs;Feeding Mobility Comments: Recently started using QC for mobility 2/2 dizziness/instability. Per son, occasionally requires assistance with standing and walking 2/2 dizziness. ADLs Comments: Per son pt complains of being dizzy frequently, does not eat regularly at home. Otherwise, she is generally able to bathe/dress herself without assist. Family/spouse assist with IADL mangament.        OT Problem List: Decreased strength;Decreased coordination;Decreased activity tolerance;Decreased safety awareness;Decreased cognition;Impaired balance (sitting and/or standing);Decreased knowledge of use of DME or AE      OT Treatment/Interventions: Self-care/ADL training;Therapeutic exercise;Therapeutic activities;DME and/or AE instruction;Patient/family education;Balance training;Energy conservation    OT Goals(Current goals can be found in the care plan section) Acute Rehab OT Goals Patient Stated Goal: To go back to bed OT Goal Formulation: With patient Time For Goal Achievement: 09/23/21  OT Frequency: Min 2X/week    Co-evaluation              AM-PAC OT "6 Clicks" Daily Activity     Outcome Measure Help from another person eating meals?: A Little Help from another person taking care of personal grooming?: A Little Help from another person toileting, which includes using toliet, bedpan, or  urinal?: A Little Help from another person bathing (including washing, rinsing, drying)?: A Little Help from another person to put on and taking off regular upper body clothing?: A Little Help from another person to put on and taking off regular lower body clothing?: A Little 6 Click Score: 18   End of Session Equipment Utilized During Treatment: Gait belt;Rolling walker (2  wheels)  Activity Tolerance: Patient limited by fatigue Patient left: in bed;with call bell/phone within reach;with bed alarm set  OT Visit Diagnosis: Other abnormalities of gait and mobility (R26.89);Muscle weakness (generalized) (M62.81)                Time: 0488-8916 OT Time Calculation (min): 21 min Charges:  OT General Charges $OT Visit: 1 Visit OT Evaluation $OT Eval Low Complexity: 1 Low OT Treatments $Self Care/Home Management : 8-22 mins  Shara Blazing, M.S., OTR/L Feeding Team - [facility] Ascom: [phone] 09/09/21, 9:49 AM

## 2021-09-09 NOTE — Assessment & Plan Note (Signed)
Potassium of 3.3 with magnesium of 1.7.  Some improvement after getting replacement yesterday. ?-Replace potassium and magnesium. ?-Continue to monitor ?

## 2021-09-09 NOTE — Assessment & Plan Note (Signed)
No injuries.  PT and/OT are recommending home health services which has been ordered. ?

## 2021-09-09 NOTE — Assessment & Plan Note (Signed)
Seems stable. -Monitor renal function -Avoid nephrotoxins 

## 2021-09-09 NOTE — Progress Notes (Signed)
Patient provided with bowel prep. Both she and her husband, who is at bedside, educated on bowel prep and scheduled procedure for Sunday. Patient will need frequent re-education and encouragement. Low motivation.  ?

## 2021-09-09 NOTE — Assessment & Plan Note (Signed)
Blood pressure soft with positive orthostatic vitals. ?-Holding home antihypertensives. ?-Giving some IV fluid ?-Continue to monitor-we will restart antihypertensives as needed ?

## 2021-09-09 NOTE — Evaluation (Addendum)
Physical Therapy Evaluation ?Patient Details ?Name: Angela Horton ?MRN: [id number] ?DOB: 03/27/1941 ?Today's Date: 09/09/2021 ? ?History of Present Illness ? Angela Horton is an 81 y.o. female who presents to the ED with complaints of generalized weakness, falls, loss of appetite, and epigastric discomfort. Pt family reports recent significant weight loss (>40 lbs). PMH includes: hypertension, hyperlipidemia, diet-controlled diabetes, gout, CKD-3A, mild cognitive impairment. ?  ?Clinical Impression ? Pt resting in bed w/ husband at bedside upon PT entrance into room for today's evaluation. Pt wakes to voice and is A&Ox4, denies any c/o pain at rest. PLOF obtained from patient chart/OT evaluation.  ? ?Pt is able to complete sit to supine w/ SUPERVISION; but requires tactile cue for proper LE transfer/initiation in order to sit EOB. Once seated EOB, Pt c/o dizziness and impulsively lies back down. SBP dropped 18 mmHg w/ sit to supine; Pt educated to perform LE exercises in supine in order to help w/ BP drop (see "General LE Exercises" section). Once seated EOB, Pt was able to perform sit to stand w/ CGA using RW to obtain BP; however, due to c/o dizziness Pt impulsively sat back down in bed and requested to lay back down due to c/o dizziness and being tired. Vitals were taken throughout in order to try and obtain orthostatic vitals (see "Vitals" section). Pt would benefit from continued skilled PT in order to improve LE strength, mobility, gait, and restore PLOF. Current discharge recommendation to HHPT is appropriate due to the level of assistance required by the patient to ensure safety and improve overall function. ? ?   ? ?Recommendations for follow up therapy are one component of a multi-disciplinary discharge planning process, led by the attending physician.  Recommendations may be updated based on patient status, additional functional criteria and insurance authorization. ? ?Follow Up Recommendations Home  health PT ? ?  ?Assistance Recommended at Discharge Intermittent Supervision/Assistance  ?Patient can return home with the following ? A little help with walking and/or transfers;A little help with bathing/dressing/bathroom;Assistance with cooking/housework;Assist for transportation;Help with stairs or ramp for entrance ? ?  ?Equipment Recommendations Rolling walker (2 wheels)  ?Recommendations for Other Services ?    ?  ?Functional Status Assessment Patient has had a recent decline in their functional status and demonstrates the ability to make significant improvements in function in a reasonable and predictable amount of time.  ? ?  ?Precautions / Restrictions Precautions ?Precautions: Fall ?Restrictions ?Weight Bearing Restrictions: No  ? ?  ? ?Mobility ? Bed Mobility ?Overal bed mobility: Needs Assistance ?Bed Mobility: Supine to Sit, Sit to Supine ?  ?  ?Supine to sit: Supervision, HOB elevated ?Sit to supine: Supervision ?  ?General bed mobility comments: impulsivity to lay back down due to c/o dizziness ?  ? ?Transfers ?Overall transfer level: Needs assistance ?Equipment used: Rolling walker (2 wheels) ?Transfers: Sit to/from Stand ?Sit to Stand: Min guard ?  ?  ?  ?  ?  ?General transfer comment: Pt declines further mobility due to c/o dizziness ?  ? ?Ambulation/Gait ?  ?  ?  ?  ?  ?  ?  ?  ? ?Stairs ?  ?  ?  ?  ?  ? ?Wheelchair Mobility ?  ? ?Modified Rankin (Stroke Patients Only) ?  ? ?  ? ?Balance Overall balance assessment: Needs assistance ?Sitting-balance support: Feet supported ?Sitting balance-Leahy Scale: Good ?  ?  ?Standing balance support: During functional activity, Bilateral upper extremity supported ?Standing balance-Leahy Scale: Fair ?  ?  ?  ?  ?  ?  ?  ?  ?  ?  ?  ?  ?   ? ? ? ?  Pertinent Vitals/Pain Pain Assessment ?Pain Assessment: No/denies pain  ? ? ?Home Living Family/patient expects to be discharged to:: Private residence ?Living Arrangements: Spouse/significant other ?Available Help  at Discharge: Family;Available 24 hours/day ?Type of Home: House ?Home Access: Stairs to enter ?Entrance Stairs-Rails: Right;Left;Can reach both ?  ?  ?Home Layout: One level ?Home Equipment: Cane - quad ?   ?  ?Prior Function Prior Level of Function : Needs assist ?  ?  ?  ?Physical Assist : Mobility (physical) ?Mobility (physical): Transfers ?ADLs (physical): IADLs;Feeding ?Mobility Comments: Per OT "Recently started using QC for mobility 2/2 dizziness/instability. Per son, occasionally requires assistance with standing and walking 2/2 dizziness." ?ADLs Comments: Per OT "Per son pt complains of being dizzy frequently, does not eat regularly at home. Otherwise, she is generally able to bathe/dress herself without assist. Family/spouse assist with IADL mangament." ?  ? ? ?Hand Dominance  ?   ? ?  ?Extremity/Trunk Assessment  ? Upper Extremity Assessment ?Upper Extremity Assessment: Generalized weakness ?  ? ?Lower Extremity Assessment ?Lower Extremity Assessment: Generalized weakness ?  ? ?   ?Communication  ?    ?Cognition Arousal/Alertness: Awake/alert ?Behavior During Therapy: [facility] for tasks assessed/performed, Flat affect ?Overall Cognitive Status: History of cognitive impairments - at baseline ?  ?  ?  ?  ?  ?  ?  ?  ?  ?  ?  ?  ?  ?  ?  ?  ?General Comments: Pt frequently asks to return to bed and c/o being tired. Easily re-directed to task/topic at hand. Oriented to self, place, and limited situation. ?  ?  ? ?  ?General Comments   ? ?  ?Exercises General Exercises - Lower Extremity ?Ankle Circles/Pumps: AROM, Both, 10 reps ?Heel Slides: AROM, Both, 10 reps ?Straight Leg Raises: AROM, Both, 10 reps  ? ?Assessment/Plan  ?  ?PT Assessment Patient needs continued PT services  ?PT Problem List Decreased strength;Decreased mobility;Decreased safety awareness;Decreased coordination;Decreased activity tolerance;Decreased cognition;Decreased balance;Decreased knowledge of use of DME ? ?   ?  ?PT Treatment  Interventions DME instruction;Therapeutic exercise;Gait training;Balance training;Stair training;Neuromuscular re-education;Functional mobility training;Therapeutic activities;Patient/family education   ? ?PT Goals (Current goals can be found in the Care Plan section)  ?Acute Rehab PT Goals ?Patient Stated Goal: to improve symptoms/get better to go home ?PT Goal Formulation: With patient ?Time For Goal Achievement: 09/23/21 ?Potential to Achieve Goals: Fair ? ?  ?Frequency Min 2X/week ?  ? ? ?Co-evaluation   ?  ?  ?  ?  ? ? ?  ?AM-PAC PT "6 Clicks" Mobility  ?Outcome Measure Help needed turning from your back to your side while in a flat bed without using bedrails?: A Little ?Help needed moving from lying on your back to sitting on the side of a flat bed without using bedrails?: A Little ?Help needed moving to and from a bed to a chair (including a wheelchair)?: A Little ?Help needed standing up from a chair using your arms (e.g., wheelchair or bedside chair)?: A Little ?Help needed to walk in hospital room?: A Lot ?Help needed climbing 3-5 steps with a railing? : A Lot ?6 Click Score: 16 ? ?  ?End of Session   ?Activity Tolerance:  (Patient limited by c/o dizziness) ?Patient left: in bed;with call bell/phone within reach;with bed alarm set;with family/visitor present ?Nurse Communication: Mobility status ?PT Visit Diagnosis: Unsteadiness on feet (R26.81);Muscle weakness (generalized) (M62.81);History of falling (Z91.81) ?  ? ?Time: 3295-1884 ?PT Time Calculation (min) (ACUTE ONLY):  21 min ? ? ?Charges:     ?  ?  ?   ? ? ?Jonnie Kind, SPT ?09/09/2021, 1:43 PM ? ?

## 2021-09-09 NOTE — Care Management (Signed)
Rolling walker and 3 n 1 ordered, Cheryl from [employer] notified of [place] needs. ?

## 2021-09-09 NOTE — Assessment & Plan Note (Signed)
Continue Crestor 

## 2021-09-09 NOTE — Assessment & Plan Note (Signed)
Very nonspecific, mildly elevated AST.  Not consistent with any obstruction. ?-Continue to monitor ?

## 2021-09-09 NOTE — Hospital Course (Addendum)
Taken from H&P. ? ?Angela Horton is a 81 y.o. female with medical history significant of hypertension, hyperlipidemia, diet-controlled diabetes, gout, CKD-3A, mild cognitive impairment, who presents with dizziness, weakness and fall. ? ?Most of the history was provided by his son and husband at bedside, patient was not feeling well with decreased appetite and poor p.o. intake for many weeks.  There is also an history of unintentional weight loss of about 40 pounds.  She recently saw her PCP for similar reason and CT of abdomen and pelvis was done on 08/25/2021 which showed wall thickening of the distal esophagus, suspecting esophagitis and mild central intrahepatic biliary ductal dilation without common bile duct dilation. ? 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 ? ?She was also found to have positive orthostatic vitals.  No nausea, vomiting or diarrhea.  Just does not want to eat and becoming too full very quickly.  Last colonoscopy was many years ago. ? ?GI was also consulted-they will try doing EGD and colonoscopy tomorrow. ?Patient also need EUS which can be done as an outpatient either at [facility] or at [facility]. ?Sent labs for CA 19-9 and CEA. ? ?PT/OT recommending home health services which have been ordered. ? ?3/11: Patient is taking colon bowel prep slowly.  Plan is for EGD and colonoscopy tomorrow morning. CA 19-9 and CEA are within normal limit. ? ?3/12: Patient had her EGD and colonoscopy today. ?EGD with Schatzki's ring at esophageal gastric junction which was dilated by GI during the procedure.  Also found to have atrophic gastric mucosa, biopsies were taken. ?Colonoscopy was unremarkable and no samples were taken. ?GI is recommending continuation with Protonix ?Patient need to follow-up with GI as an outpatient for biopsy results and to arrange for EUS if needed. ? ?Patient was also advised to supplement  her diet with Ensure or boost. ? ?We initially held her home antihypertensives due to softer blood pressure, later blood pressure remains within normal limit without any medication.  We stopped all antihypertensives on discharge and she needs a close follow-up by PCP who can restart as needed. ? ?She will follow-up with her providers for further recommendations ?

## 2021-09-09 NOTE — Assessment & Plan Note (Signed)
Most likely secondary to poor p.o. intake and dehydration.  Orthostatic vitals were positive.  Blood pressure on softer side. ?-Give her some IV fluid. ?-Encourage p.o. intake ?-Holding antihypertensives ?

## 2021-09-09 NOTE — Assessment & Plan Note (Signed)
Seems little chronic with worsening at 168 today.  There was a small lesion noted on MRCP and ampulla which need further investigation. ?Based on her history-high risk for malignancy. ?Patient need EUS-which can be done as an outpatient at [facility] or [place]. ?GI will arrange for it. ?-Check CA 19-9 and CEA levels ?

## 2021-09-09 NOTE — Progress Notes (Signed)
?Progress Note ? ? ?Patient: Angela Horton JJO:[id number] DOB: [birth year] DOA: 09/08/2021     0 ?DOS: the patient was seen and examined on 09/09/2021 ?  ?Brief hospital course: ?Taken from H&P. ? ?Angela Horton is a 81 y.o. female with medical history significant of hypertension, hyperlipidemia, diet-controlled diabetes, gout, CKD-3A, mild cognitive impairment, who presents with dizziness, weakness and fall. ? ?Most of the history was provided by his son and husband at bedside, patient was not feeling well with decreased appetite and poor p.o. intake for many weeks.  There is also an history of unintentional weight loss of about 40 pounds.  She recently saw her PCP for similar reason and CT of abdomen and pelvis was done on 08/25/2021 which showed wall thickening of the distal esophagus, suspecting esophagitis and mild central intrahepatic biliary ductal dilation without common bile duct dilation. ? 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 ? ?She was also found to have positive orthostatic vitals.  No nausea, vomiting or diarrhea.  Just does not want to eat and becoming too full very quickly.  Last colonoscopy was many years ago. ? ?GI was also consulted-they will try doing EGD and colonoscopy tomorrow. ?Patient also need EUS which can be done as an outpatient either at [facility] or at [facility]. ?Sent labs for CA 19-9 and CEA. ? ?PT/OT recommending home health services which have been ordered. ? ? ?Assessment and Plan: ?* Dizziness ?Most likely secondary to poor p.o. intake and dehydration.  Orthostatic vitals were positive.  Blood pressure on softer side. ?-Give her some IV fluid. ?-Encourage p.o. intake ?-Holding antihypertensives ? ?Abnormal LFTs ?Very nonspecific, mildly elevated AST.  Not consistent with any obstruction. ?-Continue to monitor ? ?Elevated lipase ?Seems little chronic with worsening at 168 today.  There was  a small lesion noted on MRCP and ampulla which need further investigation. ?Based on her history-high risk for malignancy. ?Patient need EUS-which can be done as an outpatient at [facility] or [place]. ?GI will arrange for it. ?-Check CA 19-9 and CEA levels ? ?Essential hypertension ?Blood pressure soft with positive orthostatic vitals. ?-Holding home antihypertensives. ?-Giving some IV fluid ?-Continue to monitor-we will restart antihypertensives as needed ? ?Weight loss, unintentional ?Concern of malignancy with some lower esophageal thickening on CT abdomen and a small lesion in ampulla of pancreas which require further investigation. ?GI is on board-patient will be going for EGD and colonoscopy in a day. ?She will also need EUS which can be done as an outpatient. ?-Ordered CA-19 and CEA levels ? ?Chronic kidney disease, stage 3a ([place]) ?Seems stable. ?-Monitor renal function ?-Avoid nephrotoxins ? ?Fall at home, initial encounter ?No injuries.  PT and/OT are recommending home health services which has been ordered. ? ?Hyperlipidemia ?- Continue Crestor ? ?Hypokalemia ?Potassium of 3.3 with magnesium of 1.7.  Some improvement after getting replacement yesterday. ?-Replace potassium and magnesium. ?-Continue to monitor ? ?Protein-calorie malnutrition, moderate ([place]) ?Estimated body mass index is 18.87 kg/m? as calculated from the following: ?  Height as of this encounter: '5\' 8"'$  (1.727 m). ?  Weight as of this encounter: 56.3 kg.  ? ?History of unintentional weight loss. ?-Dietitian consult ? ? ?Subjective: Patient seen and examined today.  Husband at bedside.  She does not have any specific complaints, did admit to about weight loss.  Stating that she does feel hungry but becomes full very quickly, a lot of times she  does not feel like eating.  Denies any nausea, vomiting or abdominal pain.  No change in her bowel habit. ? ?Physical Exam: ?Vitals:  ? 09/09/21 0046 09/09/21 0520 09/09/21 0743 09/09/21 1012  ?BP: 111/72  104/61 (!) 91/58 (!) 84/54  ?Pulse: 93 73 91 98  ?Resp: '16 17 18   '$ ?Temp: 98.6 ?F (37 ?C) 98.8 ?F (37.1 ?C) 98.6 ?F (37 ?C)   ?TempSrc: Oral Oral Oral   ?SpO2: 100% 100% 100%   ?Weight:      ?Height:      ? ?General.  Ill-appearing elderly lady, in no acute distress. ?Pulmonary.  Lungs clear bilaterally, normal respiratory effort. ?CV.  Regular rate and rhythm, no JVD, rub or murmur. ?Abdomen.  Soft, nontender, nondistended, BS positive. ?CNS.  Alert and oriented .  No focal neurologic deficit. ?Extremities.  No edema, no cyanosis, pulses intact and symmetrical. ?Psychiatry.  Judgment and insight appears normal. ? ?Data Reviewed: ?Prior notes, labs and images reviewed. ? ?Family Communication: Discussed with husband at bedside and son on phone. ? ?Disposition: ?Status is: Observation ?The patient will require care spanning > 2 midnights and should be moved to inpatient because: Severity of illness and requiring further investigation. ? ? Planned Discharge Destination: Home with Home Health ? ?DVT prophylaxis.  Subcu heparin ? ?Time spent: 50 minutes ?This record has been created using Systems analyst. Errors have been sought and corrected,but may not always be located. Such creation errors do not reflect on the standard of care. ? ?Author: ?Lorella Nimrod, MD ?09/09/2021 1:53 PM ? ?For on call review [email or web address].  ?

## 2021-09-09 NOTE — Progress Notes (Signed)
Initial Nutrition Assessment ? ?DOCUMENTATION CODES:  ? ?Not applicable ? ?INTERVENTION:  ?- Once diet advances, recommend regular diet to provide the most menu options to enhance nutritional adequacy ? ?- Ensure Enlive po BID, each supplement provides 350 kcal and 20 grams of protein (vanilla) ? ?- Continue MVI with minerals daily ? ?NUTRITION DIAGNOSIS:  ? ?Moderate Malnutrition related to chronic illness (suspicion for esophageal problems, pending further work up) as evidenced by mild fat depletion, moderate muscle depletion, percent weight loss (13). ? ?GOAL:  ? ?Patient will meet greater than or equal to 90% of their needs ? ?MONITOR:  ? ?PO intake, Supplement acceptance, Diet advancement, Labs, Weight trends ? ?REASON FOR ASSESSMENT:  ? ?Consult ?Assessment of nutrition requirement/status ? ?ASSESSMENT:  ? ?Pt admitted from home with dizziness, weakness and falls. PMH includes HTN, HLD, diet-controlled diabetes, gout, CKD IIIa, and mild cognitive impairment. ? ?CT scan showed wall thickening of the distal esophagus, suspicious for esophagitis or reflux. Mild pre pyloric gastric wall thickening versus peristalsis. Plans for EGD and colonoscopy tomorrow and possible EUS as outpatient at [facility] or [place]. ? ?Pt with husband at bedside. She endorses poor appetite for the past few months. She endorses occasional nausea and stomach pains. Denies difficulty swallowing and vomiting. States that she does not eat much throughout the day.  ? ?She endorses weight loss stating that about 2 months ago she weighed 162 lbs and a current weight of 129 lbs. Per review of chart, pt has had a 13% weight loss form 06/07/21-09/08/21 which is significant for time frame. ? ?Medications: MVI, lovaza, protonix, KLOR-CON, IV Mg sulfate ? ?Labs: potassium 3.3, Cr 1.02, lipase 168, AST 56, HgbA1c 6.7% ? ?NUTRITION - FOCUSED PHYSICAL EXAM: ? ?Flowsheet Row Most Recent Value  ?Orbital Region Mild depletion  ?Upper Arm Region Mild depletion   ?Thoracic and Lumbar Region Mild depletion  ?Buccal Region Mild depletion  ?Temple Region Moderate depletion  ?Clavicle Bone Region Moderate depletion  ?Clavicle and Acromion Bone Region Severe depletion  ?Scapular Bone Region Mild depletion  ?Dorsal Hand Moderate depletion  ?Patellar Region Moderate depletion  ?Anterior Thigh Region Moderate depletion  ?Posterior Calf Region Moderate depletion  ?Edema (RD Assessment) None  ?Hair Reviewed  ?Eyes Reviewed  ?Mouth Reviewed  ?Skin Reviewed  ?Nails Reviewed  ? ?  ? ? ?Diet Order:   ?Diet Order   ? ?       ?  Diet NPO time specified  Diet effective midnight       ?  ?  Diet full liquid Room service appropriate? Yes; Fluid consistency: Thin  Diet effective now       ?  ? ?  ?  ? ?  ? ? ?EDUCATION NEEDS:  ? ?No education needs have been identified at this time ? ?Skin:  Skin Assessment: Reviewed RN Assessment ? ?Last BM:  3/9 ? ?Height:  ? ?Ht Readings from Last 1 Encounters:  ?09/08/21 '5\' 8"'$  (1.727 m)  ? ? ?Weight:  ? ?Wt Readings from Last 1 Encounters:  ?09/08/21 56.3 kg  ? ?BMI:  Body mass index is 18.87 kg/m?. ? ?Estimated Nutritional Needs:  ? ?Kcal:  1500-1700 ? ?Protein:  75-90g ? ?Fluid:  >/=1.5L ? ?Clayborne Dana, RDN, LDN ?Clinical Nutrition ?

## 2021-09-10 DIAGNOSIS — R634 Abnormal weight loss: Secondary | ICD-10-CM

## 2021-09-10 DIAGNOSIS — K2289 Other specified disease of esophagus: Secondary | ICD-10-CM | POA: Diagnosis not present

## 2021-09-10 LAB — URINALYSIS, ROUTINE W REFLEX MICROSCOPIC
Bilirubin Urine: NEGATIVE
Glucose, UA: NEGATIVE mg/dL
Hgb urine dipstick: NEGATIVE
Ketones, ur: 5 mg/dL — AB
Leukocytes,Ua: NEGATIVE
Nitrite: NEGATIVE
Protein, ur: NEGATIVE mg/dL
Specific Gravity, Urine: 1.03 (ref 1.005–1.030)
pH: 7 (ref 5.0–8.0)

## 2021-09-10 LAB — IRON AND TIBC
Iron: 24 ug/dL — ABNORMAL LOW (ref 28–170)
Saturation Ratios: 19 % (ref 10.4–31.8)
TIBC: 126 ug/dL — ABNORMAL LOW (ref 250–450)
UIBC: 102 ug/dL

## 2021-09-10 LAB — FERRITIN: Ferritin: 464 ng/mL — ABNORMAL HIGH (ref 11–307)

## 2021-09-10 LAB — MISC LABCORP TEST (SEND OUT)
LabCorp test name: 9
Labcorp test code: 2261

## 2021-09-10 LAB — GLUCOSE, CAPILLARY: Glucose-Capillary: 82 mg/dL (ref 70–99)

## 2021-09-10 LAB — CEA: CEA: 3 ng/mL (ref 0.0–4.7)

## 2021-09-10 LAB — FOLATE: Folate: 4 ng/mL — ABNORMAL LOW (ref >5.9)

## 2021-09-10 MED ORDER — PANTOPRAZOLE SODIUM 40 MG PO TBEC
40.0000 mg | DELAYED_RELEASE_TABLET | Freq: Two times a day (BID) | ORAL | Status: DC
Start: 2021-09-10 — End: 2021-09-11
  Administered 2021-09-10 – 2021-09-11 (×2): 40 mg via ORAL
  Filled 2021-09-10 (×2): qty 1

## 2021-09-10 MED ORDER — POLYETHYLENE GLYCOL 3350 17 GM/SCOOP PO POWD
1.0000 | Freq: Once | ORAL | Status: AC
  Administered 2021-09-10: 255 g via ORAL
  Filled 2021-09-10: qty 255

## 2021-09-10 NOTE — Progress Notes (Signed)
Arlyss Repress, MD [address]  [address]  [place], [place] [address]  Main: [phone]  Fax: [phone] Pager: [phone]   Subjective: No acute events overnight, patient does not have any complaints today.  Patient's son and husband were at bedside.  She started drinking MiraLAX prep.   Objective: Vital signs in last 24 hours: Vitals:   09/09/21 2004 09/10/21 0557 09/10/21 0729 09/10/21 1558  BP: 140/71 (!) 128/59 121/71 129/71  Pulse: 85 83 82 79  Resp: 16 16 17 18   Temp: 98.8 F (37.1 C) 98.4 F (36.9 C) 98.5 F (36.9 C) 98.4 F (36.9 C)  TempSrc: Oral Oral Oral Oral  SpO2: 100% 100% 100% 100%  Weight:      Height:       Weight change:   Intake/Output Summary (Last 24 hours) at 09/10/2021 1640 Last data filed at 09/10/2021 1300 Gross per 24 hour  Intake 1240 ml  Output --  Net 1240 ml    Exam: Heart:: Regular rate and rhythm, S1S2 present, or without murmur or extra heart sounds Lungs: normal and clear to auscultation Abdomen: soft, nontender, normal bowel sounds   Lab Results: CBC Latest Ref Rng & Units 09/08/2021 07/13/2021 07/01/2021  WBC 4.0 - 10.5 K/uL 5.1 3.8 2.9(L)  Hemoglobin 12.0 - 15.0 g/dL 10.2(L) 10.9(L) 10.8(L)  Hematocrit 36.0 - 46.0 % 31.2(L) 33.3(L) 32.9(L)  Platelets 150 - 400 K/uL 278 259 286   CMP Latest Ref Rng & Units 09/09/2021 09/08/2021 08/25/2021  Glucose 70 - 99 mg/dL 78 324(M) -  BUN 8 - 23 mg/dL 21 23 -  Creatinine 0.10 - 1.00 mg/dL 2.72(Z) 3.66(Y) 4.03(K)  Sodium 135 - 145 mmol/L 137 135 -  Potassium 3.5 - 5.1 mmol/L 3.3(L) 3.0(L) -  Chloride 98 - 111 mmol/L 105 97(L) -  CO2 22 - 32 mmol/L 22 23 -  Calcium 8.9 - 10.3 mg/dL 7.4(Q) 9.5 -  Total Protein 6.5 - 8.1 g/dL - 6.7 -  Total Bilirubin 0.3 - 1.2 mg/dL - 0.8 -  Alkaline Phos 38 - 126 U/L - 31(L) -  AST 15 - 41 U/L - 56(H) -  ALT 0 - 44 U/L - 21 -    Micro Results: Recent Results (from the past 240 hour(s))  Resp Panel by RT-PCR (Flu A&B, Covid)  Nasopharyngeal Swab     Status: None   Collection Time: 09/08/21  4:01 PM   Specimen: Nasopharyngeal Swab; Nasopharyngeal(NP) swabs in vial transport medium  Result Value Ref Range Status   SARS Coronavirus 2 by RT PCR NEGATIVE NEGATIVE Final    Comment: (NOTE) SARS-CoV-2 target nucleic acids are NOT DETECTED.  The SARS-CoV-2 RNA is generally detectable in upper respiratory specimens during the acute phase of infection. The lowest concentration of SARS-CoV-2 viral copies this assay can detect is 138 copies/mL. A negative result does not preclude SARS-Cov-2 infection and should not be used as the sole basis for treatment or other patient management decisions. A negative result may occur with  improper specimen collection/handling, submission of specimen other than nasopharyngeal swab, presence of viral mutation(s) within the areas targeted by this assay, and inadequate number of viral copies(<138 copies/mL). A negative result must be combined with clinical observations, patient history, and epidemiological information. The expected result is Negative.  Fact Sheet for Patients:  BloggerCourse.com  Fact Sheet for Healthcare Providers:  SeriousBroker.it  This test is no t yet approved or cleared by the [place] and  has been authorized  for detection and/or diagnosis of SARS-CoV-2 by FDA under an Emergency Use Authorization (EUA). This EUA will remain  in effect (meaning this test can be used) for the duration of the COVID-19 declaration under Section 564(b)(1) of the Act, 21 U.S.C.section 360bbb-3(b)(1), unless the authorization is terminated  or revoked sooner.       Influenza A by PCR NEGATIVE NEGATIVE Final   Influenza B by PCR NEGATIVE NEGATIVE Final    Comment: (NOTE) The Xpert Xpress SARS-CoV-2/FLU/RSV plus assay is intended as an aid in the diagnosis of influenza from Nasopharyngeal swab specimens and should not be  used as a sole basis for treatment. Nasal washings and aspirates are unacceptable for Xpert Xpress SARS-CoV-2/FLU/RSV testing.  Fact Sheet for Patients: BloggerCourse.com  Fact Sheet for Healthcare Providers: SeriousBroker.it  This test is not yet approved or cleared by the [place] FDA and has been authorized for detection and/or diagnosis of SARS-CoV-2 by FDA under an Emergency Use Authorization (EUA). This EUA will remain in effect (meaning this test can be used) for the duration of the COVID-19 declaration under Section 564(b)(1) of the Act, 21 U.S.C. section 360bbb-3(b)(1), unless the authorization is terminated or revoked.  Performed at [facility], [address] [place], [place] [address]    Studies/Results: MR 3D Recon At Scanner  Result Date: 09/09/2021 CLINICAL DATA:  81 year old female presenting with signs and symptoms concerning for biliary obstruction. EXAM: MRI ABDOMEN WITHOUT AND WITH CONTRAST (INCLUDING MRCP) TECHNIQUE: Multiplanar multisequence MR imaging of the abdomen was performed both before and after the administration of intravenous contrast. Heavily T2-weighted images of the biliary and pancreatic ducts were obtained, and three-dimensional MRCP images were rendered by post processing. CONTRAST:  5mL GADAVIST GADOBUTROL 1 MMOL/ML IV SOLN COMPARISON:  No prior abdominal MRI. FINDINGS: Lower chest: Unremarkable. Hepatobiliary: Mild diffuse loss of signal intensity throughout the hepatic parenchyma on out of phase dual echo images, indicative of a background of mild hepatic steatosis. No suspicious cystic or solid hepatic lesions. Gallbladder is normal in appearance. No filling defects within the gallbladder to suggest gallstones. There is very mild prominence of the intrahepatic biliary tree, and mild dilatation of the common bile duct on MRCP images, measuring up to 9 mm in the porta hepatis. The duct  smoothly tapers distally, without evidence of choledocholithiasis or other obstructing lesion. These findings are favored to be age related. Pancreas: In the inferior aspect of the pancreatic head in the expected location of the ampulla (axial image 23 of series 3 and axial images 51 of series 19 and 21) there is a subtle area of low T2 signal intensity which appears slightly hypovascular on delayed post gadolinium imaging. No definitive diffusion restriction confidently identified in this region. No other definite pancreatic mass. No pancreatic ductal dilatation. No pancreatic or peripancreatic fluid collections or inflammatory changes. Spleen:  Unremarkable. Adrenals/Urinary Tract: Mild multifocal cortical thinning in both kidneys. No suspicious renal lesions. No hydroureteronephrosis in the visualized portions of the abdomen. Bilateral adrenal glands are normal in appearance. Stomach/Bowel: Visualized portions are unremarkable. Vascular/Lymphatic: No aneurysm identified in the visualized abdominal vasculature. No lymphadenopathy noted in the abdomen. Other: No significant volume of ascites noted in the visualized portions of the peritoneal cavity. Musculoskeletal: No aggressive appearing osseous lesions are noted in the visualized portions of the skeleton. IMPRESSION: 1. There is very mild intra and extrahepatic biliary ductal dilatation, which could be within normal limits given the patient's advanced age. However, there are findings which could be indicative of  a very subtle ampullary lesion in the pancreatic head, as discussed above. This is not associated with pancreatic ductal dilatation. However, correlation with ERCP and endoscopic ultrasound should be considered if clinically appropriate. 2. Mild cortical thinning in both kidneys. Electronically Signed   By: Trudie Reed M.D.   On: 09/09/2021 07:44   MR ABDOMEN MRCP W WO CONTAST  Result Date: 09/09/2021 CLINICAL DATA:  81 year old female  presenting with signs and symptoms concerning for biliary obstruction. EXAM: MRI ABDOMEN WITHOUT AND WITH CONTRAST (INCLUDING MRCP) TECHNIQUE: Multiplanar multisequence MR imaging of the abdomen was performed both before and after the administration of intravenous contrast. Heavily T2-weighted images of the biliary and pancreatic ducts were obtained, and three-dimensional MRCP images were rendered by post processing. CONTRAST:  5mL GADAVIST GADOBUTROL 1 MMOL/ML IV SOLN COMPARISON:  No prior abdominal MRI. FINDINGS: Lower chest: Unremarkable. Hepatobiliary: Mild diffuse loss of signal intensity throughout the hepatic parenchyma on out of phase dual echo images, indicative of a background of mild hepatic steatosis. No suspicious cystic or solid hepatic lesions. Gallbladder is normal in appearance. No filling defects within the gallbladder to suggest gallstones. There is very mild prominence of the intrahepatic biliary tree, and mild dilatation of the common bile duct on MRCP images, measuring up to 9 mm in the porta hepatis. The duct smoothly tapers distally, without evidence of choledocholithiasis or other obstructing lesion. These findings are favored to be age related. Pancreas: In the inferior aspect of the pancreatic head in the expected location of the ampulla (axial image 23 of series 3 and axial images 51 of series 19 and 21) there is a subtle area of low T2 signal intensity which appears slightly hypovascular on delayed post gadolinium imaging. No definitive diffusion restriction confidently identified in this region. No other definite pancreatic mass. No pancreatic ductal dilatation. No pancreatic or peripancreatic fluid collections or inflammatory changes. Spleen:  Unremarkable. Adrenals/Urinary Tract: Mild multifocal cortical thinning in both kidneys. No suspicious renal lesions. No hydroureteronephrosis in the visualized portions of the abdomen. Bilateral adrenal glands are normal in appearance.  Stomach/Bowel: Visualized portions are unremarkable. Vascular/Lymphatic: No aneurysm identified in the visualized abdominal vasculature. No lymphadenopathy noted in the abdomen. Other: No significant volume of ascites noted in the visualized portions of the peritoneal cavity. Musculoskeletal: No aggressive appearing osseous lesions are noted in the visualized portions of the skeleton. IMPRESSION: 1. There is very mild intra and extrahepatic biliary ductal dilatation, which could be within normal limits given the patient's advanced age. However, there are findings which could be indicative of a very subtle ampullary lesion in the pancreatic head, as discussed above. This is not associated with pancreatic ductal dilatation. However, correlation with ERCP and endoscopic ultrasound should be considered if clinically appropriate. 2. Mild cortical thinning in both kidneys. Electronically Signed   By: Trudie Reed M.D.   On: 09/09/2021 07:44   Medications: I have reviewed the patient's current medications. Prior to Admission:  Medications Prior to Admission  Medication Sig Dispense Refill Last Dose   brimonidine (ALPHAGAN) 0.2 % ophthalmic solution Place 1 drop into both eyes 2 (two) times daily.  3 09/07/2021 at 2000   Multiple Vitamins-Minerals (ONE-A-DAY WOMENS PETITES) TABS Take 1 tablet by mouth in the morning and at bedtime.   09/07/2021 at 2000   Omega-3 Fatty Acids (FISH OIL) 1000 MG CAPS Take 1 capsule by mouth daily.   09/07/2021 at 0800   rosuvastatin (CRESTOR) 20 MG tablet TAKE 1 TABLET BY MOUTH EVERYDAY AT BEDTIME (  Patient taking differently: Take 20 mg by mouth at bedtime.) 90 tablet 3 09/07/2021 at 2000   valsartan (DIOVAN) 40 MG tablet Take 0.5 tablets (20 mg total) by mouth daily. 90 tablet 3 09/07/2021 at 0800   amLODipine (NORVASC) 10 MG tablet Take 10 mg by mouth daily. (Patient not taking: Reported on 09/08/2021)   Not Taking   timolol (TIMOPTIC) 0.5 % ophthalmic solution Place 1 drop into both eyes  in the morning. (Patient not taking: Reported on 09/08/2021)  3 Not Taking   Scheduled:  brimonidine  1 drop Both Eyes BID   feeding supplement  237 mL Oral BID BM   heparin  5,000 Units Subcutaneous Q8H   irbesartan  75 mg Oral Daily   multivitamin with minerals  1 tablet Oral Daily   omega-3 acid ethyl esters  1 g Oral Daily   pantoprazole  40 mg Oral BID AC   rosuvastatin  20 mg Oral QHS   Continuous:  sodium chloride     lactated ringers 75 mL/hr at 09/10/21 0913   OZH:[id number], hydrALAZINE, ondansetron (ZOFRAN) IV Anti-infectives (From admission, onward)    None      Scheduled Meds:  brimonidine  1 drop Both Eyes BID   feeding supplement  237 mL Oral BID BM   heparin  5,000 Units Subcutaneous Q8H   irbesartan  75 mg Oral Daily   multivitamin with minerals  1 tablet Oral Daily   omega-3 acid ethyl esters  1 g Oral Daily   pantoprazole  40 mg Oral BID AC   rosuvastatin  20 mg Oral QHS   Continuous Infusions:  sodium chloride     lactated ringers 75 mL/hr at 09/10/21 0913   PRN Meds:.acetaminophen, hydrALAZINE, ondansetron (ZOFRAN) IV   Assessment: Principal Problem:   Dizziness Active Problems:   Essential hypertension   Hyperlipidemia   Chronic kidney disease, stage 3a (HCC)   Fall at home, initial encounter   Hypokalemia   Abnormal LFTs   Elevated lipase   Protein-calorie malnutrition, moderate (HCC)   Weight loss, unintentional  81 year old female is admitted with secondary to fall, weight loss and lack of appetite.  CT revealed esophageal thickening  Plan: Unintentional weight loss, abnormal CT scan of the esophagus Proceed with upper endoscopy and colonoscopy tomorrow Clear liquid diet today N.p.o. effective 5 AM tomorrow Continue MiraLAX bowel prep  Intra and extrahepatic biliary dilation based on MRCP Mildly elevated AST, normal T. bili and alkaline phosphatase Further investigation based on the EGD tomorrow  I have discussed alternative  options, risks & benefits,  which include, but are not limited to, bleeding, infection, perforation,respiratory complication & drug reaction.  The patient agrees with this plan & written consent will be obtained.      LOS: 1 day   Kenyon Eichelberger 09/10/2021, 4:40 PM

## 2021-09-10 NOTE — Assessment & Plan Note (Signed)
Blood pressure within normal limits today. ?-Holding home antihypertensives. ?-Continue to monitor-we will restart antihypertensives as needed ?

## 2021-09-10 NOTE — Assessment & Plan Note (Addendum)
Seems little chronic with worsening at 168 today.  There was a small lesion noted on MRCP and ampulla which need further investigation. ?Based on her history-high risk for malignancy. ?Patient need EUS-which can be done as an outpatient at [facility] or [place]. ?GI will arrange for it. ?-Check CA 19-9 and CEA levels-within normal limit ?

## 2021-09-10 NOTE — Assessment & Plan Note (Addendum)
Concern of malignancy with some lower esophageal thickening on CT abdomen and a small lesion in ampulla of pancreas which require further investigation. ?GI is on board-patient will be going for EGD and colonoscopy in a day. ?She will also need EUS which can be done as an outpatient. ?-Ordered CA-19 and CEA levels-within normal limit. ?-EGD and colonoscopy tomorrow ?

## 2021-09-10 NOTE — Progress Notes (Signed)
?Progress Note ? ? ?Patient: Angela Horton NWG:[id number] DOB: [birth year] DOA: 09/08/2021     1 ?DOS: the patient was seen and examined on 09/10/2021 ?  ?Brief hospital course: ?Taken from H&P. ? ?TIYE HUWE is a 81 y.o. female with medical history significant of hypertension, hyperlipidemia, diet-controlled diabetes, gout, CKD-3A, mild cognitive impairment, who presents with dizziness, weakness and fall. ? ?Most of the history was provided by his son and husband at bedside, patient was not feeling well with decreased appetite and poor p.o. intake for many weeks.  There is also an history of unintentional weight loss of about 40 pounds.  She recently saw her PCP for similar reason and CT of abdomen and pelvis was done on 08/25/2021 which showed wall thickening of the distal esophagus, suspecting esophagitis and mild central intrahepatic biliary ductal dilation without common bile duct dilation. ? 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 ? ?She was also found to have positive orthostatic vitals.  No nausea, vomiting or diarrhea.  Just does not want to eat and becoming too full very quickly.  Last colonoscopy was many years ago. ? ?GI was also consulted-they will try doing EGD and colonoscopy tomorrow. ?Patient also need EUS which can be done as an outpatient either at [facility] or at [facility]. ?Sent labs for CA 19-9 and CEA. ? ?PT/OT recommending home health services which have been ordered. ? ?3/11: Patient is taking colon bowel prep slowly.  Plan is for EGD and colonoscopy tomorrow morning. CA 19-9 and CEA are within normal limit. ? ? ?Assessment and Plan: ?* Dizziness ?Most likely secondary to poor p.o. intake and dehydration.  Orthostatic vitals were positive.  Blood pressure on softer side. ?-Give her some IV fluid. ?-Encourage p.o. intake ?-Holding antihypertensives ? ?Abnormal LFTs ?Very nonspecific, mildly elevated  AST.  Not consistent with any obstruction. ?-Continue to monitor ? ?Elevated lipase ?Seems little chronic with worsening at 168 today.  There was a small lesion noted on MRCP and ampulla which need further investigation. ?Based on her history-high risk for malignancy. ?Patient need EUS-which can be done as an outpatient at [facility] or [place]. ?GI will arrange for it. ?-Check CA 19-9 and CEA levels-within normal limit ? ?Essential hypertension ?Blood pressure within normal limits today. ?-Holding home antihypertensives. ?-Continue to monitor-we will restart antihypertensives as needed ? ?Weight loss, unintentional ?Concern of malignancy with some lower esophageal thickening on CT abdomen and a small lesion in ampulla of pancreas which require further investigation. ?GI is on board-patient will be going for EGD and colonoscopy in a day. ?She will also need EUS which can be done as an outpatient. ?-Ordered CA-19 and CEA levels-within normal limit. ?-EGD and colonoscopy tomorrow ? ?Chronic kidney disease, stage 3a ([place]) ?Seems stable. ?-Monitor renal function ?-Avoid nephrotoxins ? ?Fall at home, initial encounter ?No injuries.  PT and/OT are recommending home health services which has been ordered. ? ?Hyperlipidemia ?- Continue Crestor ? ?Hypokalemia ?Potassium of 3.3 with magnesium of 1.7.  Some improvement after getting replacement yesterday. ?-Replace potassium and magnesium. ?-Continue to monitor ? ?Protein-calorie malnutrition, moderate ([place]) ?Estimated body mass index is 18.87 kg/m? as calculated from the following: ?  Height as of this encounter: '5\' 8"'$  (1.727 m). ?  Weight as of this encounter: 56.3 kg.  ? ?History of unintentional weight loss. ?-Dietitian consult ? ?  ? ?Subjective: Patient was seen and examined today.  Appetite remains  very poor.  No other complaints.  She started drinking her bowel prep. ? ?Physical Exam: ?Vitals:  ? 09/09/21 1609 09/09/21 2004 09/10/21 0557 09/10/21 0729  ?BP: (!) 131/59  140/71 (!) 128/59 121/71  ?Pulse: 84 85 83 82  ?Resp: '16 16 16 17  '$ ?Temp: 98.7 ?F (37.1 ?C) 98.8 ?F (37.1 ?C) 98.4 ?F (36.9 ?C) 98.5 ?F (36.9 ?C)  ?TempSrc: Oral Oral Oral Oral  ?SpO2: 100% 100% 100% 100%  ?Weight:      ?Height:      ? ?General.  In no acute distress. ?Pulmonary.  Lungs clear bilaterally, normal respiratory effort. ?CV.  Regular rate and rhythm, no JVD, rub or murmur. ?Abdomen.  Soft, nontender, nondistended, BS positive. ?CNS.  Alert and oriented .  No focal neurologic deficit. ?Extremities.  No edema, no cyanosis, pulses intact and symmetrical. ?Psychiatry.  Judgment and insight appears normal. ? ?Data Reviewed: ?Reviewed pending labs. ? ?Family Communication: Discussed with granddaughter at bedside. ? ?Disposition: ?Status is: Inpatient ?Remains inpatient appropriate because: Severity of illness ? ? Planned Discharge Destination: Home and Home with Home Health ? ?DVT prophylaxis.  Subcu heparin ? ?Time spent: 40 minutes ? ?Author: Lorella Nimrod, MD ?09/10/2021 1:57 PM ? ?For on call review [email or web address].  ?

## 2021-09-10 NOTE — Assessment & Plan Note (Signed)
Most likely secondary to poor p.o. intake and dehydration.  Orthostatic vitals were positive.  Blood pressure on softer side. ?-Give her some IV fluid. ?-Encourage p.o. intake ?-Holding antihypertensives ?

## 2021-09-11 ENCOUNTER — Encounter: Payer: Self-pay | Admitting: Internal Medicine

## 2021-09-11 ENCOUNTER — Inpatient Hospital Stay: Payer: Medicare HMO | Admitting: Anesthesiology

## 2021-09-11 ENCOUNTER — Encounter: Payer: Self-pay | Admitting: Family Medicine

## 2021-09-11 ENCOUNTER — Encounter
Admission: EM | Disposition: A | Discharge status: Home Health | Payer: Self-pay | Source: Home / Self Care | Attending: Internal Medicine

## 2021-09-11 DIAGNOSIS — K222 Esophageal obstruction: Secondary | ICD-10-CM

## 2021-09-11 DIAGNOSIS — R634 Abnormal weight loss: Secondary | ICD-10-CM | POA: Diagnosis not present

## 2021-09-11 DIAGNOSIS — K2940 Chronic atrophic gastritis without bleeding: Secondary | ICD-10-CM

## 2021-09-11 HISTORY — PX: ESOPHAGOGASTRODUODENOSCOPY (EGD) WITH PROPOFOL: SHX5813

## 2021-09-11 HISTORY — PX: COLONOSCOPY WITH PROPOFOL: SHX5780

## 2021-09-11 LAB — BASIC METABOLIC PANEL
Anion gap: 4 — ABNORMAL LOW (ref 5–15)
BUN: 11 mg/dL (ref 8–23)
CO2: 27 mmol/L (ref 22–32)
Calcium: 8.8 mg/dL — ABNORMAL LOW (ref 8.9–10.3)
Chloride: 109 mmol/L (ref 98–111)
Creatinine, Ser: 0.84 mg/dL (ref 0.44–1.00)
GFR, Estimated: >60 mL/min (ref >60)
Glucose, Bld: 117 mg/dL — ABNORMAL HIGH (ref 70–99)
Potassium: 3.4 mmol/L — ABNORMAL LOW (ref 3.5–5.1)
Sodium: 140 mmol/L (ref 135–145)

## 2021-09-11 LAB — VITAMIN B12: Vitamin B-12: 592 pg/mL (ref 180–914)

## 2021-09-11 LAB — GLUCOSE, CAPILLARY
Glucose-Capillary: 111 mg/dL — ABNORMAL HIGH (ref 70–99)
Glucose-Capillary: 102 mg/dL — ABNORMAL HIGH (ref 70–99)
Glucose-Capillary: 95 mg/dL (ref 70–99)

## 2021-09-11 LAB — MAGNESIUM: Magnesium: 1.5 mg/dL — ABNORMAL LOW (ref 1.7–2.4)

## 2021-09-11 SURGERY — ESOPHAGOGASTRODUODENOSCOPY (EGD) WITH PROPOFOL
Anesthesia: General

## 2021-09-11 MED ORDER — ENSURE ENLIVE PO LIQD: 237.0000 mL | Freq: Two times a day (BID) | ORAL | 12 refills | Status: DC

## 2021-09-11 MED ORDER — POTASSIUM CHLORIDE CRYS ER 20 MEQ PO TBCR: 40.0000 meq | EXTENDED_RELEASE_TABLET | Freq: Once | ORAL | Status: DC

## 2021-09-11 MED ORDER — METOPROLOL TARTRATE 25 MG PO TABS: 12.5000 mg | ORAL_TABLET | Freq: Two times a day (BID) | ORAL | Status: DC

## 2021-09-11 MED ORDER — FE FUMARATE-B12-VIT C-FA-IFC PO CAPS
1.0000 | ORAL_CAPSULE | Freq: Two times a day (BID) | ORAL | Status: DC
  Administered 2021-09-11: 1 via ORAL
  Filled 2021-09-11 (×3): qty 1

## 2021-09-11 MED ORDER — PROPOFOL 500 MG/50ML IV EMUL
INTRAVENOUS | Status: DC | PRN
  Administered 2021-09-11: 100 ug/kg/min via INTRAVENOUS
  Administered 2021-09-11: 80 mg via INTRAVENOUS
  Administered 2021-09-11: 50 ug/kg/min via INTRAVENOUS
  Administered 2021-09-11: 75 mg via INTRAVENOUS

## 2021-09-11 MED ORDER — POTASSIUM CHLORIDE 10 MEQ/100ML IV SOLN: 10.0000 meq | INTRAVENOUS | Status: DC

## 2021-09-11 MED ORDER — PANTOPRAZOLE SODIUM 40 MG PO TBEC: 40.0000 mg | DELAYED_RELEASE_TABLET | Freq: Two times a day (BID) | ORAL | 2 refills | Status: DC

## 2021-09-11 MED ORDER — SODIUM CHLORIDE 0.9 % IV SOLN: INTRAVENOUS | Status: DC

## 2021-09-11 MED ORDER — PROPOFOL 500 MG/50ML IV EMUL
INTRAVENOUS | Status: AC
  Filled 2021-09-11: qty 50

## 2021-09-11 MED ORDER — LIDOCAINE HCL (PF) 2 % IJ SOLN
INTRAMUSCULAR | Status: AC
  Filled 2021-09-11: qty 5

## 2021-09-11 MED ORDER — PHENYLEPHRINE HCL (PRESSORS) 10 MG/ML IV SOLN
INTRAVENOUS | Status: DC | PRN
Start: 2021-09-11 — End: 2021-09-11
  Administered 2021-09-11 (×2): 160 ug via INTRAVENOUS
  Administered 2021-09-11: 240 ug via INTRAVENOUS

## 2021-09-11 MED ORDER — LIDOCAINE HCL (CARDIAC) PF 100 MG/5ML IV SOSY
PREFILLED_SYRINGE | INTRAVENOUS | Status: DC | PRN
  Administered 2021-09-11: 60 mg via INTRAVENOUS

## 2021-09-11 MED ORDER — FE FUMARATE-B12-VIT C-FA-IFC PO CAPS
1.0000 | ORAL_CAPSULE | Freq: Two times a day (BID) | ORAL | 2 refills | Status: DC
Start: 2021-09-11 — End: 2022-01-31

## 2021-09-11 MED ORDER — LACTATED RINGERS IV SOLN
INTRAVENOUS | Status: DC | PRN
Start: 2021-09-11 — End: 2021-09-11

## 2021-09-11 MED ORDER — MAGNESIUM SULFATE 4 GM/100ML IV SOLN
4.0000 g | Freq: Once | INTRAVENOUS | Status: AC
  Administered 2021-09-11: 4 g via INTRAVENOUS
  Filled 2021-09-11: qty 100

## 2021-09-11 NOTE — TOC Transition Note (Addendum)
Transition of Care (TOC) - CM/SW Discharge Note ? ? ?Patient Details  ?Name: Angela Horton ?MRN: [id number] ?Date of Birth: [birth year] ? ?Transition of Care (TOC) CM/SW Contact:  ?Yechezkel Fertig E Gorge Almanza, LCSW ?Phone Number: ?09/11/2021, 11:50 AM ? ? ?Clinical Narrative:   Per progressive rounds, patient will DC home today. ?CSW notified Malachy Mood with [facility]. ? ?1:43- Per RN, 3in1 and RW that were ordered by [facility] on Friday have not been delivered to bedside. Called Jasmine with [employer] to follow up. She said these will be delivered to bedside as soon as possible, she is aware patient is ready to DC today. ? ? ?Final next level of care: [place] ?Barriers to Discharge: Barriers Resolved ? ? ?Patient Goals and CMS Choice ?  ?  ?  ? ?Discharge Placement ?  ?           ?  ?  ?  ?  ? ?Discharge Plan and Services ?  ?  ?           ?  ?  ?  ?  ?  ?  ?[place] Agency: [place] ?Date HH Agency Contacted: 09/11/21 ?  ?Representative spoke with at [place]: Malachy Mood ? ?Social Determinants of Health (SDOH) Interventions ?  ? ? ?Readmission Risk Interventions ?No flowsheet data found. ? ? ? ? ?

## 2021-09-11 NOTE — Op Note (Signed)
[facility] ?Gastroenterology ?Patient Name: Angela Horton ?Procedure Date: 09/11/2021 9:41 AM ?MRN: [id number] ?Account #: [id number] ?Date of Birth: 08-24-1940 ?Admit Type: Outpatient ?Age: 81 ?Room: [facility] ENDO ROOM 4 ?Gender: Female ?Note Status: Finalized ?Instrument Name: Upper Endoscope [id number] ?Procedure:             Upper GI endoscopy ?Indications:           Weight loss ?Providers:             Lin Landsman MD, MD ?Medicines:             General Anesthesia ?Complications:         No immediate complications. Estimated blood loss: None. ?Procedure:             Pre-Anesthesia Assessment: ?                       - Prior to the procedure, a History and Physical was  ?                       performed, and patient medications and allergies were  ?                       reviewed. The patient is competent. The risks and  ?                       benefits of the procedure and the sedation options and  ?                       risks were discussed with the patient. All questions  ?                       were answered and informed consent was obtained.  ?                       Patient identification and proposed procedure were  ?                       verified by the physician, the nurse, the  ?                       anesthesiologist, the anesthetist and the technician  ?                       in the pre-procedure area in the procedure room in the  ?                       endoscopy suite. Mental Status Examination: alert and  ?                       oriented. Airway Examination: normal oropharyngeal  ?                       airway and neck mobility. Respiratory Examination:  ?                       clear to auscultation. CV Examination: normal.  ?                       Prophylactic Antibiotics: The  patient does not require  ?                       prophylactic antibiotics. Prior Anticoagulants: The  ?                       patient has taken no previous anticoagulant or  ?                        antiplatelet agents. ASA Grade Assessment: III - A  ?                       patient with severe systemic disease. After reviewing  ?                       the risks and benefits, the patient was deemed in  ?                       satisfactory condition to undergo the procedure. The  ?                       anesthesia plan was to use general anesthesia.  ?                       Immediately prior to administration of medications,  ?                       the patient was re-assessed for adequacy to receive  ?                       sedatives. The heart rate, respiratory rate, oxygen  ?                       saturations, blood pressure, adequacy of pulmonary  ?                       ventilation, and response to care were monitored  ?                       throughout the procedure. The physical status of the  ?                       patient was re-assessed after the procedure. ?                       After obtaining informed consent, the endoscope was  ?                       passed under direct vision. Throughout the procedure,  ?                       the patient's blood pressure, pulse, and oxygen  ?                       saturations were monitored continuously. The Endoscope  ?                       was introduced through the mouth, and advanced to the  ?  second part of duodenum. The upper GI endoscopy was  ?                       accomplished without difficulty. The patient tolerated  ?                       the procedure well. ?Findings: ?     The duodenal bulb and second portion of the duodenum were normal. ?     Esophagogastric landmarks were identified: the gastroesophageal junction  ?     was found at 35 cm from the incisors. ?     A low-grade of narrowing Schatzki ring was found at the gastroesophageal  ?     junction. A TTS dilator was passed through the scope. Dilation with an  ?     02-08-09 mm balloon and a 04-13-11 mm balloon dilator was performed to 12  ?     mm. ?     A medium-sized  hiatal hernia was present. ?     The examined esophagus was normal. ?     Diffuse atrophic mucosa was found in the entire examined stomach.  ?     Biopsies were taken with a cold forceps for histology. ?Impression:            - Normal duodenal bulb and second portion of the  ?                       duodenum. ?                       - Esophagogastric landmarks identified. ?                       - Low-grade of narrowing Schatzki ring. Dilated. ?                       - Medium-sized hiatal hernia. ?                       - Normal esophagus. ?                       - Gastric mucosal atrophy. Biopsied. ?Recommendation:        - Follow an antireflux regimen indefinitely. ?                       - Use Prilosec (omeprazole) 40 mg PO daily  ?                       indefinitely. ?                       - Proceed with colonoscopy as scheduled ?                       See colonoscopy report ?Procedure Code(s):     --- Professional --- ?                       [phone], Esophagogastroduodenoscopy, flexible,  ?                       transoral; with transendoscopic balloon dilation of  ?  esophagus (less than 30 mm diameter) ?                       43239, 59,51, Esophagogastroduodenoscopy, flexible,  ?                       transoral; with biopsy, single or multiple ?Diagnosis Code(s):     --- Professional --- ?                       K22.2, Esophageal obstruction ?                       K44.9, Diaphragmatic hernia without obstruction or  ?                       gangrene ?                       K31.89, Other diseases of stomach and duodenum ?                       R63.4, Abnormal weight loss ?CPT copyright 2019 [employer]. All rights reserved. ?The codes documented in this report are preliminary and upon coder review may  ?be revised to meet current compliance requirements. ?Dr. Ulyess Mort ?Shimshon Narula Raeanne Gathers MD, MD ?09/11/2021 10:13:46 AM ?This report has been signed electronically. ?Number of  Addenda: 0 ?Note Initiated On: 09/11/2021 9:41 AM ?Estimated Blood Loss:  Estimated blood loss: none. Estimated blood loss: none. ?     [facility] ?

## 2021-09-11 NOTE — Discharge Summary (Signed)
Physician Discharge Summary   Patient: Angela Horton MRN: [id number] DOB: 06/19/1941  Admit date:     09/08/2021  Discharge date: 09/11/21  Discharge Physician: Lorella Nimrod   PCP: Delsa Grana, PA-C   Recommendations at discharge:  Please follow-up on gastric biopsy results Patient might need EUS which will be arranged by gastroenterology as outpatient. Follow-up with gastroenterology within a week for biopsy results. Follow-up with primary care provider within a week. Patient is currently off of her antihypertensives-which can be restarted as needed by primary care provider.  Discharge Diagnoses: Principal Problem:   Dizziness Active Problems:   Abnormal LFTs   Elevated lipase   Essential hypertension   Weight loss, unintentional   Chronic kidney disease, stage 3a ([place])   Fall at home, initial encounter   Hyperlipidemia   Hypokalemia   Protein-calorie malnutrition, moderate ([place])   Hospital Course: Taken from H&P.  Angela Horton is a 81 y.o. female with medical history significant of hypertension, hyperlipidemia, diet-controlled diabetes, gout, CKD-3A, mild cognitive impairment, who presents with dizziness, weakness and fall.  Most of the history was provided by his son and husband at bedside, patient was not feeling well with decreased appetite and poor p.o. intake for many weeks.  There is also an history of unintentional weight loss of about 40 pounds.  She recently saw her PCP for similar reason and CT of abdomen and pelvis was done on 08/25/2021 which showed wall thickening of the distal esophagus, suspecting esophagitis and mild central intrahepatic biliary ductal dilation without common bile duct dilation.  She had MRCP done yesterday that showed mild intra and extra biliary dilation > ampullary lesion in pancreatic head-EUS recommended.  She was also found to have positive orthostatic vitals.  No nausea, vomiting or diarrhea.  Just does not want to eat and becoming  too full very quickly.  Last colonoscopy was many years ago.  GI was also consulted-they will try doing EGD and colonoscopy tomorrow. Patient also need EUS which can be done as an outpatient either at [facility] or at [facility]. Sent labs for CA 19-9 and CEA.  PT/OT recommending home health services which have been ordered.  3/11: Patient is taking colon bowel prep slowly.  Plan is for EGD and colonoscopy tomorrow morning. CA 19-9 and CEA are within normal limit.  3/12: Patient had her EGD and colonoscopy today. EGD with Schatzki's ring at esophageal gastric junction which was dilated by GI during the procedure.  Also found to have atrophic gastric mucosa, biopsies were taken. Colonoscopy was unremarkable and no samples were taken. GI is recommending continuation with Protonix Patient need to follow-up with GI as an outpatient for biopsy results and to arrange for EUS if needed.  Patient was also advised to supplement her diet with Ensure or boost.  We initially held her home antihypertensives due to softer blood pressure, later blood pressure remains within normal limit without any medication.  We stopped all antihypertensives on discharge and she needs a close follow-up by PCP who can restart as needed.  She will follow-up with her providers for further recommendations  Assessment and Plan: * Dizziness Most likely secondary to poor p.o. intake and dehydration.  Orthostatic vitals were positive.  Blood pressure on softer side. -Give her some IV fluid. -Encourage p.o. intake -Holding antihypertensives  Abnormal LFTs Very nonspecific, mildly elevated AST.  Not consistent with any obstruction. -Continue to monitor  Elevated lipase Seems little chronic with worsening at 168 today.  There was  a small lesion noted on MRCP and ampulla which need further investigation. Based on her history-high risk for malignancy. Patient need EUS-which can be done as an outpatient at [facility] or [place]. GI  will arrange for it. -Check CA 19-9 and CEA levels-within normal limit  Essential hypertension Blood pressure within normal limits today. -Holding home antihypertensives. -Continue to monitor-we will restart antihypertensives as needed  Weight loss, unintentional Concern of malignancy with some lower esophageal thickening on CT abdomen and a small lesion in ampulla of pancreas which require further investigation. GI is on board-patient will be going for EGD and colonoscopy in a day. She will also need EUS which can be done as an outpatient. -Ordered CA-19 and CEA levels-within normal limit. -EGD and colonoscopy tomorrow  Chronic kidney disease, stage 3a ([place]) Seems stable. -Monitor renal function -Avoid nephrotoxins  Fall at home, initial encounter No injuries.  PT and/OT are recommending home health services which has been ordered.  Hyperlipidemia - Continue Crestor  Hypokalemia Potassium of 3.3 with magnesium of 1.7.  Some improvement after getting replacement yesterday. -Replace potassium and magnesium. -Continue to monitor  Protein-calorie malnutrition, moderate ([facility]) Estimated body mass index is 18.87 kg/m as calculated from the following:   Height as of this encounter: '5\' 8"'$  (1.727 m).   Weight as of this encounter: 56.3 kg.   History of unintentional weight loss. -Dietitian consult         Consultants: Gastroenterology Procedures performed: EGD, colonoscopy Disposition: Home health Diet recommendation:  Discharge Diet Orders (From admission, onward)     Start     Ordered   09/11/21 0000  Diet - low sodium heart healthy        09/11/21 1210           Cardiac diet DISCHARGE MEDICATION: Allergies as of 09/11/2021       Reactions   Metformin And Related    Upset stomach headaches        Medication List     STOP taking these medications    amLODipine 10 MG tablet Commonly known as: NORVASC   timolol 0.5 % ophthalmic solution Commonly  known as: TIMOPTIC   valsartan 40 MG tablet Commonly known as: DIOVAN       TAKE these medications    brimonidine 0.2 % ophthalmic solution Commonly known as: ALPHAGAN Place 1 drop into both eyes 2 (two) times daily.   feeding supplement Liqd Take 237 mLs by mouth 2 (two) times daily between meals.   ferrous [id number] C-folic acid capsule Commonly known as: TRINSICON / FOLTRIN Take 1 capsule by mouth 2 (two) times daily after a meal.   Fish Oil 1000 MG Caps Take 1 capsule by mouth daily.   One-A-Day Womens Petites Tabs Take 1 tablet by mouth in the morning and at bedtime.   pantoprazole 40 MG tablet Commonly known as: PROTONIX Take 1 tablet (40 mg total) by mouth 2 (two) times daily before a meal.   rosuvastatin 20 MG tablet Commonly known as: CRESTOR TAKE 1 TABLET BY MOUTH EVERYDAY AT BEDTIME What changed: See the new instructions.               Durable Medical Equipment  (From admission, onward)           Start     Ordered   09/09/21 1336  For home use only DME 3 n 1  Once        09/09/21 1335   09/09/21 1336  For home  use only DME Walker rolling  Once       Question Answer Comment  Walker: With 5 Inch Wheels   Patient needs a walker to treat with the following condition Impaired ambulation      09/09/21 1335            Follow-up Information     Vanga, Tally Due, MD. Schedule an appointment as soon as possible for a visit in 1 week(s).   Specialty: Gastroenterology Contact information: [place] [address] [phone]         Delsa Grana, PA-C. Schedule an appointment as soon as possible for a visit in 1 week(s).   Specialty: Family Medicine Contact information: [address] [place] [address] [phone]                Discharge Exam: Danley Danker Weights   09/08/21 1041 09/11/21 0950  Weight: 56.3 kg 56.3 kg   General.     In no acute distress. Pulmonary.  Lungs  clear bilaterally, normal respiratory effort. CV.  Regular rate and rhythm, no JVD, rub or murmur. Abdomen.  Soft, nontender, nondistended, BS positive. CNS.  Alert and oriented .  No focal neurologic deficit. Extremities.  No edema, no cyanosis, pulses intact and symmetrical. Psychiatry.  Judgment and insight appears normal.   Condition at discharge: stable  The results of significant diagnostics from this hospitalization (including imaging, microbiology, ancillary and laboratory) are listed below for reference.   Imaging Studies: DG Chest 2 View  Result Date: 09/08/2021 CLINICAL DATA:  Dizziness, fall EXAM: CHEST - 2 VIEW COMPARISON:  07/01/2021 FINDINGS: The heart size and mediastinal contours are within normal limits. Both lungs are clear. The visualized skeletal structures are unremarkable. IMPRESSION: No active cardiopulmonary disease. Electronically Signed   By: Elmer Picker M.D.   On: 09/08/2021 12:58   CT HEAD WO CONTRAST (5MM)  Result Date: 09/08/2021 CLINICAL DATA:  Dizziness and fall. EXAM: CT HEAD WITHOUT CONTRAST CT CERVICAL SPINE WITHOUT CONTRAST TECHNIQUE: Multidetector CT imaging of the head and cervical spine was performed following the standard protocol without intravenous contrast. Multiplanar CT image reconstructions of the cervical spine were also generated. RADIATION DOSE REDUCTION: This exam was performed according to the departmental dose-optimization program which includes automated exposure control, adjustment of the mA and/or kV according to patient size and/or use of iterative reconstruction technique. COMPARISON:  CT head dated June 26, 2021. FINDINGS: CT HEAD FINDINGS Brain: No evidence of acute infarction, hemorrhage, hydrocephalus, extra-axial collection or mass lesion/mass effect. Stable mild atrophy and chronic microvascular ischemic changes. Old lacunar infarct in the left thalamus again noted. Vascular: Calcified atherosclerosis at the skull base. No  hyperdense vessel. Skull: Negative for fracture or focal lesion. Sinuses/Orbits: No acute finding. Unchanged partial opacification of the right mastoid air cells. Other: None. CT CERVICAL SPINE FINDINGS Alignment: No traumatic malalignment. Straightening of the normal cervical lordosis with trace retrolisthesis at C3-C4. Skull base and vertebrae: No acute fracture. No primary bone lesion or focal pathologic process. Soft tissues and spinal canal: No prevertebral fluid or swelling. No visible canal hematoma. Disc levels: Moderate to severe disc height loss and bilateral uncovertebral hypertrophy at C3-C4. Scattered mild facet arthropathy throughout the cervical spine, greater on the left. Upper chest: Negative. Other: None. IMPRESSION: 1. No acute intracranial abnormality. Stable mild atrophy and chronic microvascular ischemic changes. 2. No acute cervical spine fracture or traumatic listhesis. 3. Moderate to severe cervical spondylosis at C3-C4. Electronically Signed  By: Titus Dubin M.D.   On: 09/08/2021 12:54   CT Cervical Spine Wo Contrast  Result Date: 09/08/2021 CLINICAL DATA:  Dizziness and fall. EXAM: CT HEAD WITHOUT CONTRAST CT CERVICAL SPINE WITHOUT CONTRAST TECHNIQUE: Multidetector CT imaging of the head and cervical spine was performed following the standard protocol without intravenous contrast. Multiplanar CT image reconstructions of the cervical spine were also generated. RADIATION DOSE REDUCTION: This exam was performed according to the departmental dose-optimization program which includes automated exposure control, adjustment of the mA and/or kV according to patient size and/or use of iterative reconstruction technique. COMPARISON:  CT head dated June 26, 2021. FINDINGS: CT HEAD FINDINGS Brain: No evidence of acute infarction, hemorrhage, hydrocephalus, extra-axial collection or mass lesion/mass effect. Stable mild atrophy and chronic microvascular ischemic changes. Old lacunar infarct  in the left thalamus again noted. Vascular: Calcified atherosclerosis at the skull base. No hyperdense vessel. Skull: Negative for fracture or focal lesion. Sinuses/Orbits: No acute finding. Unchanged partial opacification of the right mastoid air cells. Other: None. CT CERVICAL SPINE FINDINGS Alignment: No traumatic malalignment. Straightening of the normal cervical lordosis with trace retrolisthesis at C3-C4. Skull base and vertebrae: No acute fracture. No primary bone lesion or focal pathologic process. Soft tissues and spinal canal: No prevertebral fluid or swelling. No visible canal hematoma. Disc levels: Moderate to severe disc height loss and bilateral uncovertebral hypertrophy at C3-C4. Scattered mild facet arthropathy throughout the cervical spine, greater on the left. Upper chest: Negative. Other: None. IMPRESSION: 1. No acute intracranial abnormality. Stable mild atrophy and chronic microvascular ischemic changes. 2. No acute cervical spine fracture or traumatic listhesis. 3. Moderate to severe cervical spondylosis at C3-C4. Electronically Signed   By: Titus Dubin M.D.   On: 09/08/2021 12:54   CT ABDOMEN PELVIS W CONTRAST  Result Date: 08/27/2021 CLINICAL DATA:  Abdominal bloating. Loss of appetite. Unintentional weight loss. Malnutrition. Epigastric pain. EXAM: CT ABDOMEN AND PELVIS WITH CONTRAST TECHNIQUE: Multidetector CT imaging of the abdomen and pelvis was performed using the standard protocol following bolus administration of intravenous contrast. RADIATION DOSE REDUCTION: This exam was performed according to the departmental dose-optimization program which includes automated exposure control, adjustment of the mA and/or kV according to patient size and/or use of iterative reconstruction technique. CONTRAST:  33m OMNIPAQUE IOHEXOL 300 MG/ML  SOLN COMPARISON:  None available. FINDINGS: Lower chest: No basilar airspace disease or pleural effusion. Heart is normal in size, there are coronary  artery calcifications. Hepatobiliary: Diffusely decreased hepatic density typical of steatosis. Focal fatty infiltration adjacent to the falciform ligament. No discrete liver lesion. Physiologically distended gallbladder. There is mild central intrahepatic biliary ductal dilatation. No common bile duct dilatation, CBD measures 6 mm. Mild motion artifact through the liver limits detailed assessment. No evidence of obstructing mass. Pancreas: No ductal dilatation or inflammation.  No pancreatic mass. Spleen: Normal in size without focal abnormality. Adrenals/Urinary Tract: Mild adrenal thickening without dominant adrenal nodule. Lobulated renal contours. No hydronephrosis. No evidence of renal stone or focal renal lesion. Symmetric excretion and homogeneous enhancement. Partially distended urinary bladder. There is a 19 mm fluid density intravesicular lesion at the left urinary trigone, probable ureterocele. There is mild distal left ureteral dilatation. No bladder wall thickening. No evidence of solid bladder lesion. Stomach/Bowel: Wall thickening of the distal esophagus. There is mild pre pyloric gastric wall thickening versus peristalsis. No small bowel obstruction, inflammation, or wall thickening. Enteric contrast reaches the colon. The appendix is normal. There is redundancy of the descending colon. Small  volume of colonic stool. No colonic wall thickening, pericolonic edema, or evidence of colonic mass. Vascular/Lymphatic: Mild aortic and branch atherosclerosis. No aortic aneurysm. Patent portal vein. There are no enlarged lymph nodes in the abdomen or pelvis. Reproductive: Hysterectomy. The ovaries are tentatively visualized and quiescent. There is no suspicious adnexal mass. Other: No ascites. No abdominopelvic collection. No evidence of abdominopelvic mass. No abdominal wall hernia. Musculoskeletal: Degenerative change throughout the lumbar spine, both hips, in the pubic symphysis. Grade 1 anterolisthesis of  L4 on L5 likely facet mediated. No focal bone lesion or acute osseous findings. IMPRESSION: 1. Wall thickening of the distal esophagus, suspicious for esophagitis or reflux. Mild pre pyloric gastric wall thickening versus peristalsis. Finding could be further assessed with endoscopy in the setting of weight loss and upper abdominal pain. 2. Mild central intrahepatic biliary ductal dilatation without common bile duct dilatation. No evidence of obstructing mass. Recommend correlation with LFTs. If elevated, consider further evaluation with MRCP or ERCP. 3. Mild hepatic steatosis. 4. 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 Aortic Atherosclerosis (ICD10-I70.0). Electronically Signed   By: Keith Rake M.D.   On: 08/27/2021 11:22   MR 3D Recon At Scanner  Result Date: 09/09/2021 CLINICAL DATA:  81 year old female presenting with signs and symptoms concerning for biliary obstruction. EXAM: MRI ABDOMEN WITHOUT AND WITH CONTRAST (INCLUDING MRCP) TECHNIQUE: Multiplanar multisequence MR imaging of the abdomen was performed both before and after the administration of intravenous contrast. Heavily T2-weighted images of the biliary and pancreatic ducts were obtained, and three-dimensional MRCP images were rendered by post processing. CONTRAST:  72m GADAVIST GADOBUTROL 1 MMOL/ML IV SOLN COMPARISON:  No prior abdominal MRI. FINDINGS: Lower chest: Unremarkable. Hepatobiliary: Mild diffuse loss of signal intensity throughout the hepatic parenchyma on out of phase dual echo images, indicative of a background of mild hepatic steatosis. No suspicious cystic or solid hepatic lesions. Gallbladder is normal in appearance. No filling defects within the gallbladder to suggest gallstones. There is very mild prominence of the intrahepatic biliary tree, and mild dilatation of the common bile duct on MRCP images, measuring up to 9 mm in the porta hepatis. The duct smoothly tapers distally, without evidence of choledocholithiasis or  other obstructing lesion. These findings are favored to be age related. Pancreas: In the inferior aspect of the pancreatic head in the expected location of the ampulla (axial image 23 of series 3 and axial images 51 of series 19 and 21) there is a subtle area of low T2 signal intensity which appears slightly hypovascular on delayed post gadolinium imaging. No definitive diffusion restriction confidently identified in this region. No other definite pancreatic mass. No pancreatic ductal dilatation. No pancreatic or peripancreatic fluid collections or inflammatory changes. Spleen:  Unremarkable. Adrenals/Urinary Tract: Mild multifocal cortical thinning in both kidneys. No suspicious renal lesions. No hydroureteronephrosis in the visualized portions of the abdomen. Bilateral adrenal glands are normal in appearance. Stomach/Bowel: Visualized portions are unremarkable. Vascular/Lymphatic: No aneurysm identified in the visualized abdominal vasculature. No lymphadenopathy noted in the abdomen. Other: No significant volume of ascites noted in the visualized portions of the peritoneal cavity. Musculoskeletal: No aggressive appearing osseous lesions are noted in the visualized portions of the skeleton. IMPRESSION: 1. There is very mild intra and extrahepatic biliary ductal dilatation, which could be within normal limits given the patient's advanced age. However, there are findings which could be indicative of a very subtle ampullary lesion in the pancreatic head, as discussed above. This is not associated with pancreatic ductal dilatation. However, correlation with  ERCP and endoscopic ultrasound should be considered if clinically appropriate. 2. Mild cortical thinning in both kidneys. Electronically Signed   By: Vinnie Langton M.D.   On: 09/09/2021 07:44   MR ABDOMEN MRCP W WO CONTAST  Result Date: 09/09/2021 CLINICAL DATA:  81 year old female presenting with signs and symptoms concerning for biliary obstruction. EXAM:  MRI ABDOMEN WITHOUT AND WITH CONTRAST (INCLUDING MRCP) TECHNIQUE: Multiplanar multisequence MR imaging of the abdomen was performed both before and after the administration of intravenous contrast. Heavily T2-weighted images of the biliary and pancreatic ducts were obtained, and three-dimensional MRCP images were rendered by post processing. CONTRAST:  37m GADAVIST GADOBUTROL 1 MMOL/ML IV SOLN COMPARISON:  No prior abdominal MRI. FINDINGS: Lower chest: Unremarkable. Hepatobiliary: Mild diffuse loss of signal intensity throughout the hepatic parenchyma on out of phase dual echo images, indicative of a background of mild hepatic steatosis. No suspicious cystic or solid hepatic lesions. Gallbladder is normal in appearance. No filling defects within the gallbladder to suggest gallstones. There is very mild prominence of the intrahepatic biliary tree, and mild dilatation of the common bile duct on MRCP images, measuring up to 9 mm in the porta hepatis. The duct smoothly tapers distally, without evidence of choledocholithiasis or other obstructing lesion. These findings are favored to be age related. Pancreas: In the inferior aspect of the pancreatic head in the expected location of the ampulla (axial image 23 of series 3 and axial images 51 of series 19 and 21) there is a subtle area of low T2 signal intensity which appears slightly hypovascular on delayed post gadolinium imaging. No definitive diffusion restriction confidently identified in this region. No other definite pancreatic mass. No pancreatic ductal dilatation. No pancreatic or peripancreatic fluid collections or inflammatory changes. Spleen:  Unremarkable. Adrenals/Urinary Tract: Mild multifocal cortical thinning in both kidneys. No suspicious renal lesions. No hydroureteronephrosis in the visualized portions of the abdomen. Bilateral adrenal glands are normal in appearance. Stomach/Bowel: Visualized portions are unremarkable. Vascular/Lymphatic: No aneurysm  identified in the visualized abdominal vasculature. No lymphadenopathy noted in the abdomen. Other: No significant volume of ascites noted in the visualized portions of the peritoneal cavity. Musculoskeletal: No aggressive appearing osseous lesions are noted in the visualized portions of the skeleton. IMPRESSION: 1. There is very mild intra and extrahepatic biliary ductal dilatation, which could be within normal limits given the patient's advanced age. However, there are findings which could be indicative of a very subtle ampullary lesion in the pancreatic head, as discussed above. This is not associated with pancreatic ductal dilatation. However, correlation with ERCP and endoscopic ultrasound should be considered if clinically appropriate. 2. Mild cortical thinning in both kidneys. Electronically Signed   By: DVinnie LangtonM.D.   On: 09/09/2021 07:44    Microbiology: Results for orders placed or performed during the hospital encounter of 09/08/21  Resp Panel by RT-PCR (Flu A&B, Covid) Nasopharyngeal Swab     Status: None   Collection Time: 09/08/21  4:01 PM   Specimen: Nasopharyngeal Swab; Nasopharyngeal(NP) swabs in vial transport medium  Result Value Ref Range Status   SARS Coronavirus 2 by RT PCR NEGATIVE NEGATIVE Final    Comment: (NOTE) SARS-CoV-2 target nucleic acids are NOT DETECTED.  The SARS-CoV-2 RNA is generally detectable in upper respiratory specimens during the acute phase of infection. The lowest concentration of SARS-CoV-2 viral copies this assay can detect is 138 copies/mL. A negative result does not preclude SARS-Cov-2 infection and should not be used as the sole basis for treatment or  other patient management decisions. A negative result may occur with  improper specimen collection/handling, submission of specimen other than nasopharyngeal swab, presence of viral mutation(s) within the areas targeted by this assay, and inadequate number of viral copies(<138 copies/mL). A  negative result must be combined with clinical observations, patient history, and epidemiological information. The expected result is Negative.  Fact Sheet for Patients:  EntrepreneurPulse.com.au  Fact Sheet for Healthcare Providers:  IncredibleEmployment.be  This test is no t yet approved or cleared by the [place] FDA and  has been authorized for detection and/or diagnosis of SARS-CoV-2 by FDA under an Emergency Use Authorization (EUA). This EUA will remain  in effect (meaning this test can be used) for the duration of the COVID-19 declaration under Section 564(b)(1) of the Act, 21 U.S.C.section 360bbb-3(b)(1), unless the authorization is terminated  or revoked sooner.       Influenza A by PCR NEGATIVE NEGATIVE Final   Influenza B by PCR NEGATIVE NEGATIVE Final    Comment: (NOTE) The Xpert Xpress SARS-CoV-2/FLU/RSV plus assay is intended as an aid in the diagnosis of influenza from Nasopharyngeal swab specimens and should not be used as a sole basis for treatment. Nasal washings and aspirates are unacceptable for Xpert Xpress SARS-CoV-2/FLU/RSV testing.  Fact Sheet for Patients: EntrepreneurPulse.com.au  Fact Sheet for Healthcare Providers: IncredibleEmployment.be  This test is not yet approved or cleared by the [place] FDA and has been authorized for detection and/or diagnosis of SARS-CoV-2 by FDA under an Emergency Use Authorization (EUA). This EUA will remain in effect (meaning this test can be used) for the duration of the COVID-19 declaration under Section 564(b)(1) of the Act, 21 U.S.C. section 360bbb-3(b)(1), unless the authorization is terminated or revoked.  Performed at [facility], [address] [place], [place] [address]     Labs: CBC: Recent Labs  Lab 09/08/21 1107  WBC 5.1  HGB 10.2*  HCT 31.2*  MCV 87.4  PLT 244   Basic Metabolic Panel: Recent Labs   Lab 09/08/21 1107 09/09/21 0534 09/11/21 0455  NA 135 137 140  K 3.0* 3.3* 3.4*  CL 97* 105 109  CO2 '23 22 27  '$ GLUCOSE 100* 78 117*  BUN '23 21 11  '$ CREATININE 1.07* 1.02* 0.84  CALCIUM 9.5 8.8* 8.8*  MG 1.7  --  1.5*   Liver Function Tests: Recent Labs  Lab 09/08/21 1107  AST 56*  ALT 21  ALKPHOS 31*  BILITOT 0.8  PROT 6.7  ALBUMIN 3.1*   CBG: Recent Labs  Lab 09/09/21 0740 09/10/21 0731 09/11/21 0755 09/11/21 1204  GLUCAP 77 82 111* 102*    Discharge time spent: greater than 30 minutes.  This record has been created using Systems analyst. Errors have been sought and corrected,but may not always be located. Such creation errors do not reflect on the standard of care.   Signed: Lorella Nimrod, MD [facility] 09/11/2021

## 2021-09-11 NOTE — Progress Notes (Incomplete)
Received MD order to dhcairge patient to homw with home health  ?

## 2021-09-11 NOTE — Transfer of Care (Signed)
Immediate Anesthesia Transfer of Care Note ? ?Patient: CITLALLI WEIKEL ? ?Procedure(s) Performed: ESOPHAGOGASTRODUODENOSCOPY (EGD) WITH PROPOFOL ?COLONOSCOPY WITH PROPOFOL ? ?Patient Location: PACU ? ?Anesthesia Type:MAC ? ?Level of Consciousness: drowsy ? ?Airway & Oxygen Therapy: Patient Spontanous Breathing ? ?Post-op Assessment: Report given to RN ? ?Post vital signs: stable ? ?Last Vitals:  ?Vitals Value Taken Time  ?BP 96/47 09/11/21 1047  ?Temp 36.4 ?C 09/11/21 1047  ?Pulse 82 09/11/21 1047  ?Resp 16 09/11/21 1048  ?SpO2 83 % 09/11/21 1047  ?Vitals shown include unvalidated device data. ? ?Last Pain:  ?Vitals:  ? 09/11/21 1047  ?TempSrc: Skin  ?PainSc:   ?   ? ?  ? ?Complications: No notable events documented. ?

## 2021-09-11 NOTE — Anesthesia Preprocedure Evaluation (Signed)
Anesthesia Evaluation  ?Patient identified by MRN, date of birth, ID band ?Patient awake ? ? ? ?Reviewed: ?Allergy & Precautions, H&P , NPO status , Patient's Chart, lab work & pertinent test results, reviewed documented beta blocker date and time  ? ?Airway ?Mallampati: II ? ? ?Neck ROM: full ? ? ? Dental ? ?(+) Poor Dentition ?  ?Pulmonary ?neg pulmonary ROS,  ?  ?Pulmonary exam normal ? ? ? ? ? ? ? Cardiovascular ?Exercise Tolerance: Poor ?hypertension, On Medications ?negative cardio ROS ?Normal cardiovascular exam ?Rhythm:regular Rate:Normal ? ? ?  ?Neuro/Psych ?negative neurological ROS ? negative psych ROS  ? GI/Hepatic ?negative GI ROS, Neg liver ROS,   ?Endo/Other  ?negative endocrine ROSdiabetes ? Renal/GU ?Renal disease  ?negative genitourinary ?  ?Musculoskeletal ? ? Abdominal ?  ?Peds ? Hematology ?negative hematology ROS ?(+)   ?Anesthesia Other Findings ?Past Medical History: ?No date: Diabetes mellitus without complication ([place]) ?No date: Gout ?No date: Hyperlipidemia ?No date: Hypertension ?No date: Obesity ?Past Surgical History: ?No date: ABDOMINAL HYSTERECTOMY ?BMI   ? Body Mass Index: 18.87 kg/m?  ?  ? Reproductive/Obstetrics ?negative OB ROS ? ?  ? ? ? ? ? ? ? ? ? ? ? ? ? ?  ?  ? ? ? ? ? ? ? ? ?Anesthesia Physical ?Anesthesia Plan ? ?ASA: 3 and emergent ? ?Anesthesia Plan: General  ? ?Post-op Pain Management:   ? ?Induction:  ? ?PONV Risk Score and Plan:  ? ?Airway Management Planned:  ? ?Additional Equipment:  ? ?Intra-op Plan:  ? ?Post-operative Plan:  ? ?Informed Consent: I have reviewed the patients History and Physical, chart, labs and discussed the procedure including the risks, benefits and alternatives for the proposed anesthesia with the patient or authorized representative who has indicated his/her understanding and acceptance.  ? ? ? ?Dental Advisory Given ? ?Plan Discussed with: CRNA ? ?Anesthesia Plan Comments:   ? ? ? ? ? ? ?Anesthesia Quick  Evaluation ? ?

## 2021-09-11 NOTE — Progress Notes (Addendum)
Received MD order to discharge patient to home with Home Health, reviewed discharge instructions , home meds, prescriptions and follow up  appointments with patient son Randall Hiss and her husband and all verbalized understanding. Walker sent home with patient.  Three and one to be delivered to home.   ?

## 2021-09-11 NOTE — Op Note (Signed)
[facility] ?Gastroenterology ?Patient Name: Madeline Pho ?Procedure Date: 09/11/2021 9:40 AM ?MRN: [id number] ?Account #: [id number] ?Date of Birth: April 22, 1941 ?Admit Type: Outpatient ?Age: 81 ?Room: [facility] ENDO ROOM 4 ?Gender: Female ?Note Status: Finalized ?Instrument Name: Peds Colonoscope [id number] ?Procedure:             Colonoscopy ?Indications:           Weight loss ?Providers:             Lin Landsman MD, MD ?Medicines:             General Anesthesia ?Complications:         No immediate complications. Estimated blood loss: None. ?Procedure:             Pre-Anesthesia Assessment: ?                       - Prior to the procedure, a History and Physical was  ?                       performed, and patient medications and allergies were  ?                       reviewed. The patient is competent. The risks and  ?                       benefits of the procedure and the sedation options and  ?                       risks were discussed with the patient. All questions  ?                       were answered and informed consent was obtained.  ?                       Patient identification and proposed procedure were  ?                       verified by the physician, the nurse, the  ?                       anesthesiologist, the anesthetist and the technician  ?                       in the pre-procedure area in the procedure room in the  ?                       endoscopy suite. Mental Status Examination: alert and  ?                       oriented. Airway Examination: normal oropharyngeal  ?                       airway and neck mobility. Respiratory Examination:  ?                       clear to auscultation. CV Examination: normal.  ?                       Prophylactic Antibiotics: The patient does  not require  ?                       prophylactic antibiotics. Prior Anticoagulants: The  ?                       patient has taken no previous anticoagulant or  ?                       antiplatelet  agents. ASA Grade Assessment: III - A  ?                       patient with severe systemic disease. After reviewing  ?                       the risks and benefits, the patient was deemed in  ?                       satisfactory condition to undergo the procedure. The  ?                       anesthesia plan was to use general anesthesia.  ?                       Immediately prior to administration of medications,  ?                       the patient was re-assessed for adequacy to receive  ?                       sedatives. The heart rate, respiratory rate, oxygen  ?                       saturations, blood pressure, adequacy of pulmonary  ?                       ventilation, and response to care were monitored  ?                       throughout the procedure. The physical status of the  ?                       patient was re-assessed after the procedure. ?                       After obtaining informed consent, the colonoscope was  ?                       passed under direct vision. Throughout the procedure,  ?                       the patient's blood pressure, pulse, and oxygen  ?                       saturations were monitored continuously. The  ?                       Colonoscope was introduced through the anus and  ?  advanced to the the cecum, identified by appendiceal  ?                       orifice and ileocecal valve. The colonoscopy was  ?                       performed with moderate difficulty due to significant  ?                       looping and the patient's body habitus. Successful  ?                       completion of the procedure was aided by applying  ?                       abdominal pressure. The patient tolerated the  ?                       procedure well. The quality of the bowel preparation  ?                       was fair. ?Findings: ?     The perianal and digital rectal examinations were normal. Pertinent  ?     negatives include normal sphincter tone and no  palpable rectal lesions. ?     The entire examined colon appeared normal. Unable to intubate TI despite  ?     several attempts ?     The retroflexed view of the distal rectum and anal verge was normal and  ?     showed no anal or rectal abnormalities. ?Impression:            - Preparation of the colon was fair. ?                       - The entire examined colon is normal. ?                       - The distal rectum and anal verge are normal on  ?                       retroflexion view. ?                       - No specimens collected. ?Recommendation:        - Return patient to hospital ward for possible  ?                       discharge same day. ?                       - Chopped diet and mechanical soft diet. ?                       - Continue present medications. ?Procedure Code(s):     --- Professional --- ?                       [id number], Colonoscopy, flexible; diagnostic, including  ?  collection of specimen(s) by brushing or washing, when  ?                       performed (separate procedure) ?Diagnosis Code(s):     --- Professional --- ?                       R63.4, Abnormal weight loss ?CPT copyright 2019 American Medical Association. All rights reserved. ?The codes documented in this report are preliminary and upon coder review may  ?be revised to meet current compliance requirements. ?Dr. Ulyess Mort ?Tonishia Steffy Raeanne Gathers MD, MD ?09/11/2021 10:41:12 AM ?This report has been signed electronically. ?Number of Addenda: 0 ?Note Initiated On: 09/11/2021 9:40 AM ?Scope Withdrawal Time: 0 hours 15 minutes 57 seconds  ?Total Procedure Duration: 0 hours 23 minutes 13 seconds  ?Estimated Blood Loss:  Estimated blood loss: none. ?     [facility] ?

## 2021-09-12 ENCOUNTER — Encounter: Payer: Self-pay | Admitting: Gastroenterology

## 2021-09-12 ENCOUNTER — Telehealth: Payer: Self-pay

## 2021-09-12 LAB — GLUCOSE, CAPILLARY: Glucose-Capillary: 110 mg/dL — ABNORMAL HIGH (ref 70–99)

## 2021-09-12 NOTE — Telephone Encounter (Signed)
Transition Care Management Follow-up Telephone Call ?Date of discharge and from where: 09/11/21 [facility] ?How have you been since you were released from the hospital? Pt states she is feeling better, resting ?Any questions or concerns? No ? ?Items Reviewed: ?Did the pt receive and understand the discharge instructions provided? Yes  ?Medications obtained and verified? Yes  ?Other? No  ?Any new allergies since your discharge? No  ?Dietary orders reviewed? Yes ?Do you have support at home? Yes  ? ?Home Care and Equipment/Supplies: ?Were home health services ordered? yes ?If so, what is the name of the agency? [facility]  ?Has the agency set up a time to come to the patient's home? no ?Were any new equipment or medical supplies ordered?  Yes: 3 in 1 & rolling walker ?What is the name of the medical supply agency? [employer] ?Were you able to get the supplies/equipment? yes ?Do you have any questions related to the use of the equipment or supplies? No ? ?Functional Questionnaire: (I = Independent and D = Dependent) ?ADLs: I with assistance ? ?Bathing/Dressing- I with assistance ? ?Meal Prep- I ? ?Eating- I ? ?Maintaining continence- I ? ?Transferring/Ambulation- I with walker ? ?Managing Meds- I with assistance ? ?Follow up appointments reviewed: ? ?PCP Hospital f/u appt confirmed? Yes  Scheduled to see Dr. Rosana Berger on 09/20/21  @ 2:40.Marland Kitchen ?Are transportation arrangements needed? No  ?If their condition worsens, is the pt aware to call PCP or go to the Emergency Dept.? Yes ?Was the patient provided with contact information for the PCP's office or ED? Yes ?Was to pt encouraged to call back with questions or concerns? Yes ? ?

## 2021-09-12 NOTE — Telephone Encounter (Signed)
Received approval for EUS. Called and left message on mobile to return call for scheduling. Home voicemail is full. ?

## 2021-09-12 NOTE — Telephone Encounter (Signed)
Received referral for EUS. Sent for review prior to scheduling. ?

## 2021-09-13 ENCOUNTER — Other Ambulatory Visit: Payer: Self-pay

## 2021-09-13 ENCOUNTER — Telehealth: Payer: Self-pay

## 2021-09-13 LAB — SURGICAL PATHOLOGY

## 2021-09-13 NOTE — Telephone Encounter (Signed)
Spoke with son, Randall Hiss. EUS available for scheduling 09/15/21 with next available date of 09/29/21. Eric accepted 09/29/21. EUS has been scheduled for 09/29/21. Instructions sent through my chart. Encouraged to call with any questions. Denies anticoagulation use. ?

## 2021-09-13 NOTE — Telephone Encounter (Signed)
Second attempt to contact Angela Horton to arrange EUS Left voicemail to return call for scheduling. ?

## 2021-09-14 NOTE — Anesthesia Postprocedure Evaluation (Signed)
Anesthesia Post Note ? ?Patient: Angela Horton ? ?Procedure(s) Performed: ESOPHAGOGASTRODUODENOSCOPY (EGD) WITH PROPOFOL ?COLONOSCOPY WITH PROPOFOL ? ?Patient location during evaluation: PACU ?Anesthesia Type: General ?Level of consciousness: awake and alert ?Pain management: pain level controlled ?Vital Signs Assessment: post-procedure vital signs reviewed and stable ?Respiratory status: spontaneous breathing, nonlabored ventilation, respiratory function stable and patient connected to nasal cannula oxygen ?Cardiovascular status: blood pressure returned to baseline and stable ?Postop Assessment: no apparent nausea or vomiting ?Anesthetic complications: no ? ? ?No notable events documented. ? ? ?Last Vitals:  ?Vitals:  ? 09/11/21 1207 09/11/21 1535  ?BP: 114/71 128/66  ?Pulse: 63 77  ?Resp: 20 20  ?Temp: 36.9 ?C 36.8 ?C  ?SpO2: 97% 100%  ?  ?Last Pain:  ?Vitals:  ? 09/11/21 1115  ?TempSrc:   ?PainSc: 0-No pain  ? ? ?  ?  ?  ?  ?  ?  ? ?Molli Barrows ? ? ? ? ?

## 2021-09-18 DIAGNOSIS — I129 Hypertensive chronic kidney disease with stage 1 through stage 4 chronic kidney disease, or unspecified chronic kidney disease: Secondary | ICD-10-CM | POA: Diagnosis not present

## 2021-09-18 DIAGNOSIS — E440 Moderate protein-calorie malnutrition: Secondary | ICD-10-CM | POA: Diagnosis not present

## 2021-09-18 DIAGNOSIS — G3184 Mild cognitive impairment, so stated: Secondary | ICD-10-CM | POA: Diagnosis not present

## 2021-09-18 DIAGNOSIS — E1122 Type 2 diabetes mellitus with diabetic chronic kidney disease: Secondary | ICD-10-CM | POA: Diagnosis not present

## 2021-09-18 DIAGNOSIS — M109 Gout, unspecified: Secondary | ICD-10-CM | POA: Diagnosis not present

## 2021-09-18 DIAGNOSIS — E785 Hyperlipidemia, unspecified: Secondary | ICD-10-CM | POA: Diagnosis not present

## 2021-09-18 DIAGNOSIS — G309 Alzheimer's disease, unspecified: Secondary | ICD-10-CM | POA: Diagnosis not present

## 2021-09-18 DIAGNOSIS — N1831 Chronic kidney disease, stage 3a: Secondary | ICD-10-CM | POA: Diagnosis not present

## 2021-09-18 DIAGNOSIS — Z9181 History of falling: Secondary | ICD-10-CM | POA: Diagnosis not present

## 2021-09-20 ENCOUNTER — Other Ambulatory Visit: Payer: Self-pay

## 2021-09-20 ENCOUNTER — Ambulatory Visit (INDEPENDENT_AMBULATORY_CARE_PROVIDER_SITE_OTHER): Payer: Medicare HMO | Admitting: Internal Medicine

## 2021-09-20 ENCOUNTER — Encounter: Payer: Self-pay | Admitting: Internal Medicine

## 2021-09-20 VITALS — BP 116/70 | HR 82 | Temp 97.8°F | Resp 16 | Ht 68.0 in | Wt 115.3 lb

## 2021-09-20 DIAGNOSIS — R634 Abnormal weight loss: Secondary | ICD-10-CM | POA: Diagnosis not present

## 2021-09-20 DIAGNOSIS — E46 Unspecified protein-calorie malnutrition: Secondary | ICD-10-CM | POA: Diagnosis not present

## 2021-09-20 DIAGNOSIS — Z09 Encounter for follow-up examination after completed treatment for conditions other than malignant neoplasm: Secondary | ICD-10-CM | POA: Diagnosis not present

## 2021-09-20 DIAGNOSIS — R531 Weakness: Secondary | ICD-10-CM

## 2021-09-20 DIAGNOSIS — R42 Dizziness and giddiness: Secondary | ICD-10-CM

## 2021-09-20 DIAGNOSIS — I959 Hypotension, unspecified: Secondary | ICD-10-CM | POA: Diagnosis not present

## 2021-09-20 NOTE — Progress Notes (Signed)
? ?Established Patient Office Visit ? ?Subjective:  ?Patient ID: Angela Horton, female    DOB: 1940-11-18  Age: 81 y.o. MRN: [id number] ? ?CC:  ?Chief Complaint  ?Patient presents with  ? Hospitalization Follow-up  ? ? ?HPI ?Angela Horton presents for hospital follow up. ? ?Discharge Date: 09/11/21 ?Hospital/facility: [facility] ?Diagnosis: Dizziness, weakness ?Procedures/tests: EGD - showing Schatzki's ring at esophageal gastric junction which was dilated and biopsies taken of atrophic gastric mucosa. Colonoscopy unremarkable. 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 Walks with walker, feel out of the bed once when rolling over too fast but no other falls since hospitalization. Appetite good, eats 2-3 meals a day, supplementing with Ensure. Weight 115, making weight loss about 30 pounds since December. Wants to discontinue some medications.  ? ?Past Medical History:  ?Diagnosis Date  ? Diabetes mellitus without complication ([place])   ? Gout   ? Hyperlipidemia   ? Hypertension   ? Obesity   ? ? ?Past Surgical History:  ?Procedure Laterality Date  ? ABDOMINAL HYSTERECTOMY    ? COLONOSCOPY WITH PROPOFOL N/A 09/11/2021  ? Procedure: COLONOSCOPY WITH PROPOFOL;  Surgeon: Lin Landsman, MD;  Location: [facility] ENDOSCOPY;  Service: Gastroenterology;  Laterality: N/A;  ? ESOPHAGOGASTRODUODENOSCOPY (EGD) WITH PROPOFOL N/A 09/11/2021  ? Procedure: ESOPHAGOGASTRODUODENOSCOPY (EGD) WITH PROPOFOL;  Surgeon: Lin Landsman, MD;  Location: [facility] ENDOSCOPY;  Service: Gastroenterology;  Laterality: N/A;  ? ? ?Family History  ?Problem Relation Age of Onset  ? Kidney disease Mother   ? Kidney disease Sister    ? Stroke Brother   ? Heart disease Sister   ? Stroke Sister   ? ? ?Social History  ? ?Socioeconomic History  ? Marital status: Married  ?  Spouse name: Not on file  ? Number of children: 4  ? Years of education: Not on file  ? Highest education level: Not on file  ?Occupational History  ? Not on file  ?Tobacco Use  ? Smoking status: Never  ? Smokeless tobacco: Never  ? Tobacco comments:  ?  Smoking cessation materials not required  ?Vaping Use  ? Vaping Use: Never used  ?Substance and Sexual Activity  ? Alcohol use: No  ?  Alcohol/week: 0.0 standard drinks  ? Drug use: No  ? Sexual activity: Yes  ?Other Topics Concern  ? Not on file  ?Social History Narrative  ? Not on file  ? ?Social Determinants of Health  ? ?Financial Resource Strain: Low Risk   ? Difficulty of Paying Living Expenses: Not hard at all  ?Food Insecurity: No Food Insecurity  ? Worried About Charity fundraiser in the Last Year: Never true  ? Ran Out of Food in the Last Year: Never true  ?Transportation Needs: No Transportation Needs  ? Lack of Transportation (Medical): No  ? Lack of Transportation (Non-Medical): No  ?Physical Activity: Inactive  ? Days of Exercise per Week: 0 days  ? Minutes of Exercise per Session: 0 min  ?Stress: No Stress Concern Present  ? Feeling of Stress : Not at all  ?Social Connections: Moderately Integrated  ? Frequency of Communication with Friends and Family: More than three times a week  ? Frequency of Social Gatherings with  Friends and Family: More than three times a week  ? Attends Religious Services: More than 4 times per year  ? Active Member of Clubs or Organizations: No  ? Attends Archivist Meetings: Never  ? Marital Status: Married  ?Intimate Partner Violence: Not At Risk  ? Fear of Current or Ex-Partner: No  ? Emotionally Abused: No  ? Physically Abused: No  ? Sexually Abused: No  ? ? ?Outpatient Medications Prior to Visit  ?Medication Sig Dispense Refill  ? brimonidine (ALPHAGAN) 0.2 % ophthalmic  solution Place 1 drop into both eyes 2 (two) times daily.  3  ? feeding supplement (ENSURE ENLIVE / ENSURE PLUS) LIQD Take 237 mLs by mouth 2 (two) times daily between meals. 237 mL 12  ? ferrous [id number] C-folic acid (TRINSICON / FOLTRIN) capsule Take 1 capsule by mouth 2 (two) times daily after a meal. 60 capsule 2  ? Multiple Vitamins-Minerals (ONE-A-DAY WOMENS PETITES) TABS Take 1 tablet by mouth in the morning and at bedtime.    ? Omega-3 Fatty Acids (FISH OIL) 1000 MG CAPS Take 1 capsule by mouth daily.    ? pantoprazole (PROTONIX) 40 MG tablet Take 1 tablet (40 mg total) by mouth 2 (two) times daily before a meal. 60 tablet 2  ? rosuvastatin (CRESTOR) 20 MG tablet TAKE 1 TABLET BY MOUTH EVERYDAY AT BEDTIME (Patient taking differently: Take 20 mg by mouth at bedtime.) 90 tablet 3  ? ?No facility-administered medications prior to visit.  ? ? ?Allergies  ?Allergen Reactions  ? Metformin And Related   ?  Upset stomach headaches  ? ? ?ROS ?Review of Systems  ?Constitutional:  Positive for unexpected weight change. Negative for chills and fever.  ?Eyes:  Negative for visual disturbance.  ?Respiratory:  Negative for cough and shortness of breath.   ?Cardiovascular:  Negative for chest pain.  ?Gastrointestinal:  Negative for abdominal pain, blood in stool, constipation, diarrhea, nausea and vomiting.  ?Genitourinary:  Negative for dysuria and hematuria.  ?Neurological:  Positive for weakness. Negative for dizziness and light-headedness.  ? ?  ?Objective:  ?  ?Physical Exam ?Constitutional:   ?   Appearance: Normal appearance.  ?   Comments: Frail appearing, walks with walker  ?HENT:  ?   Head: Normocephalic and atraumatic.  ?Eyes:  ?   Conjunctiva/sclera: Conjunctivae normal.  ?Cardiovascular:  ?   Rate and Rhythm: Normal rate and regular rhythm.  ?Pulmonary:  ?   Effort: Pulmonary effort is normal.  ?   Breath sounds: Normal breath sounds.  ?Abdominal:  ?   General: There is no distension.  ?    Palpations: Abdomen is soft.  ?   Tenderness: There is no abdominal tenderness. There is no guarding or rebound.  ?Musculoskeletal:  ?   Right lower leg: No edema.  ?   Left lower leg: No edema.  ?Skin: ?   General: Skin is warm and dry.  ?Neurological:  ?   General: No focal deficit present.  ?   Mental Status: She is alert. Mental status is at baseline.  ?Psychiatric:     ?   Mood and Affect: Mood normal.     ?   Behavior: Behavior normal.  ? ? ?BP 116/70   Pulse 82   Temp 97.8 ?F (36.6 ?C)   Resp 16   Ht _0  (1.727 m)   Wt 115 lb 4.8 oz (52.3 kg)   SpO2 97%   BMI 17.53 kg/m?  ?Wt Readings from  Last 3 Encounters:  ?09/20/21 115 lb 4.8 oz (52.3 kg)  ?09/11/21 124 lb 1.9 oz (56.3 kg)  ?08/11/21 124 lb 3.2 oz (56.3 kg)  ? ? ? ?Health Maintenance Due  ?Topic Date Due  ? Zoster Vaccines- Shingrix (1 of 2) Never done  ? OPHTHALMOLOGY EXAM  10/23/2018  ? COVID-19 Vaccine (4 - Booster for Pfizer series) 08/18/2020  ? FOOT EXAM  01/21/2021  ? URINE MICROALBUMIN  01/21/2021  ? ? ?There are no preventive care reminders to display for this patient. ? ?Lab Results  ?Component Value Date  ? TSH 2.900 09/09/2021  ? ?Lab Results  ?Component Value Date  ? WBC 5.1 09/08/2021  ? HGB 10.2 (L) 09/08/2021  ? HCT 31.2 (L) 09/08/2021  ? MCV 87.4 09/08/2021  ? PLT 278 09/08/2021  ? ?Lab Results  ?Component Value Date  ? NA 140 09/11/2021  ? K 3.4 (L) 09/11/2021  ? CO2 27 09/11/2021  ? GLUCOSE 117 (H) 09/11/2021  ? BUN 11 09/11/2021  ? CREATININE 0.84 09/11/2021  ? BILITOT 0.8 09/08/2021  ? ALKPHOS 31 (L) 09/08/2021  ? AST 56 (H) 09/08/2021  ? ALT 21 09/08/2021  ? PROT 6.7 09/08/2021  ? ALBUMIN 3.1 (L) 09/08/2021  ? CALCIUM 8.8 (L) 09/11/2021  ? ANIONGAP 4 (L) 09/11/2021  ? EGFR 38 (L) 06/07/2021  ? ?Lab Results  ?Component Value Date  ? CHOL 86 01/25/2021  ? ?Lab Results  ?Component Value Date  ? HDL 26 (L) 01/25/2021  ? ?Lab Results  ?Component Value Date  ? LDLCALC 45 01/25/2021  ? ?Lab Results  ?Component Value Date  ? TRIG  143 09/08/2021  ? ?Lab Results  ?Component Value Date  ? CHOLHDL 3.3 01/25/2021  ? ?Lab Results  ?Component Value Date  ? HGBA1C 6.7 (H) 07/13/2021  ? ? ?  ?Assessment & Plan:  ? ?1. Hospital discharge follow-up/Hypote

## 2021-09-20 NOTE — H&P (View-Only) (Signed)
? ?Established Patient Office Visit ? ?Subjective:  ?Patient ID: Angela Horton, female    DOB: 12/22/1940  Age: 80 y.o. MRN: [id number] ? ?CC:  ?Chief Complaint  ?Patient presents with  ? Hospitalization Follow-up  ? ? ?HPI ?Angela Horton presents for hospital follow up. ? ?Discharge Date: 09/11/21 ?Hospital/facility: [facility] ?Diagnosis: Dizziness, weakness ?Procedures/tests: EGD - showing Schatzki's ring at esophageal gastric junction which was dilated and biopsies taken of atrophic gastric mucosa. Colonoscopy unremarkable. 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 Walks with walker, feel out of the bed once when rolling over too fast but no other falls since hospitalization. Appetite good, eats 2-3 meals a day, supplementing with Ensure. Weight 115, making weight loss about 30 pounds since December. Wants to discontinue some medications.  ? ?Past Medical History:  ?Diagnosis Date  ? Diabetes mellitus without complication (HCC)   ? Gout   ? Hyperlipidemia   ? Hypertension   ? Obesity   ? ? ?Past Surgical History:  ?Procedure Laterality Date  ? ABDOMINAL HYSTERECTOMY    ? COLONOSCOPY WITH PROPOFOL N/A 09/11/2021  ? Procedure: COLONOSCOPY WITH PROPOFOL;  Surgeon: Vanga, Rohini Reddy, MD;  Location: [facility] ENDOSCOPY;  Service: Gastroenterology;  Laterality: N/A;  ? ESOPHAGOGASTRODUODENOSCOPY (EGD) WITH PROPOFOL N/A 09/11/2021  ? Procedure: ESOPHAGOGASTRODUODENOSCOPY (EGD) WITH PROPOFOL;  Surgeon: Vanga, Rohini Reddy, MD;  Location: [facility] ENDOSCOPY;  Service: Gastroenterology;  Laterality: N/A;  ? ? ?Family History  ?Problem Relation Age of Onset  ? Kidney disease Mother   ? Kidney disease Sister    ? Stroke Brother   ? Heart disease Sister   ? Stroke Sister   ? ? ?Social History  ? ?Socioeconomic History  ? Marital status: Married  ?  Spouse name: Not on file  ? Number of children: 4  ? Years of education: Not on file  ? Highest education level: Not on file  ?Occupational History  ? Not on file  ?Tobacco Use  ? Smoking status: Never  ? Smokeless tobacco: Never  ? Tobacco comments:  ?  Smoking cessation materials not required  ?Vaping Use  ? Vaping Use: Never used  ?Substance and Sexual Activity  ? Alcohol use: No  ?  Alcohol/week: 0.0 standard drinks  ? Drug use: No  ? Sexual activity: Yes  ?Other Topics Concern  ? Not on file  ?Social History Narrative  ? Not on file  ? ?Social Determinants of Health  ? ?Financial Resource Strain: Low Risk   ? Difficulty of Paying Living Expenses: Not hard at all  ?Food Insecurity: No Food Insecurity  ? Worried About Running Out of Food in the Last Year: Never true  ? Ran Out of Food in the Last Year: Never true  ?Transportation Needs: No Transportation Needs  ? Lack of Transportation (Medical): No  ? Lack of Transportation (Non-Medical): No  ?Physical Activity: Inactive  ? Days of Exercise per Week: 0 days  ? Minutes of Exercise per Session: 0 min  ?Stress: No Stress Concern Present  ? Feeling of Stress : Not at all  ?Social Connections: Moderately Integrated  ? Frequency of Communication with Friends and Family: More than three times a week  ? Frequency of Social Gatherings with   Friends and Family: More than three times a week  ? Attends Religious Services: More than 4 times per year  ? Active Member of Clubs or Organizations: No  ? Attends Club or Organization Meetings: Never  ? Marital Status: Married  ?Intimate Partner Violence: Not At Risk  ? Fear of Current or Ex-Partner: No  ? Emotionally Abused: No  ? Physically Abused: No  ? Sexually Abused: No  ? ? ?Outpatient Medications Prior to Visit  ?Medication Sig Dispense Refill  ? brimonidine (ALPHAGAN) 0.2 % ophthalmic  solution Place 1 drop into both eyes 2 (two) times daily.  3  ? feeding supplement (ENSURE ENLIVE / ENSURE PLUS) LIQD Take 237 mLs by mouth 2 (two) times daily between meals. 237 mL 12  ? ferrous fumarate-b12-vitamic C-folic acid (TRINSICON / FOLTRIN) capsule Take 1 capsule by mouth 2 (two) times daily after a meal. 60 capsule 2  ? Multiple Vitamins-Minerals (ONE-A-DAY WOMENS PETITES) TABS Take 1 tablet by mouth in the morning and at bedtime.    ? Omega-3 Fatty Acids (FISH OIL) 1000 MG CAPS Take 1 capsule by mouth daily.    ? pantoprazole (PROTONIX) 40 MG tablet Take 1 tablet (40 mg total) by mouth 2 (two) times daily before a meal. 60 tablet 2  ? rosuvastatin (CRESTOR) 20 MG tablet TAKE 1 TABLET BY MOUTH EVERYDAY AT BEDTIME (Patient taking differently: Take 20 mg by mouth at bedtime.) 90 tablet 3  ? ?No facility-administered medications prior to visit.  ? ? ?Allergies  ?Allergen Reactions  ? Metformin And Related   ?  Upset stomach headaches  ? ? ?ROS ?Review of Systems  ?Constitutional:  Positive for unexpected weight change. Negative for chills and fever.  ?Eyes:  Negative for visual disturbance.  ?Respiratory:  Negative for cough and shortness of breath.   ?Cardiovascular:  Negative for chest pain.  ?Gastrointestinal:  Negative for abdominal pain, blood in stool, constipation, diarrhea, nausea and vomiting.  ?Genitourinary:  Negative for dysuria and hematuria.  ?Neurological:  Positive for weakness. Negative for dizziness and light-headedness.  ? ?  ?Objective:  ?  ?Physical Exam ?Constitutional:   ?   Appearance: Normal appearance.  ?   Comments: Frail appearing, walks with walker  ?HENT:  ?   Head: Normocephalic and atraumatic.  ?Eyes:  ?   Conjunctiva/sclera: Conjunctivae normal.  ?Cardiovascular:  ?   Rate and Rhythm: Normal rate and regular rhythm.  ?Pulmonary:  ?   Effort: Pulmonary effort is normal.  ?   Breath sounds: Normal breath sounds.  ?Abdominal:  ?   General: There is no distension.  ?    Palpations: Abdomen is soft.  ?   Tenderness: There is no abdominal tenderness. There is no guarding or rebound.  ?Musculoskeletal:  ?   Right lower leg: No edema.  ?   Left lower leg: No edema.  ?Skin: ?   General: Skin is warm and dry.  ?Neurological:  ?   General: No focal deficit present.  ?   Mental Status: She is alert. Mental status is at baseline.  ?Psychiatric:     ?   Mood and Affect: Mood normal.     ?   Behavior: Behavior normal.  ? ? ?BP 116/70   Pulse 82   Temp 97.8 ?F (36.6 ?C)   Resp 16   Ht 5' 8" (1.727 m)   Wt 115 lb 4.8 oz (52.3 kg)   SpO2 97%   BMI 17.53 kg/m?  ?Wt Readings from   Last 3 Encounters:  ?09/20/21 115 lb 4.8 oz (52.3 kg)  ?09/11/21 124 lb 1.9 oz (56.3 kg)  ?08/11/21 124 lb 3.2 oz (56.3 kg)  ? ? ? ?Health Maintenance Due  ?Topic Date Due  ? Zoster Vaccines- Shingrix (1 of 2) Never done  ? OPHTHALMOLOGY EXAM  10/23/2018  ? COVID-19 Vaccine (4 - Booster for Pfizer series) 08/18/2020  ? FOOT EXAM  01/21/2021  ? URINE MICROALBUMIN  01/21/2021  ? ? ?There are no preventive care reminders to display for this patient. ? ?Lab Results  ?Component Value Date  ? TSH 2.900 09/09/2021  ? ?Lab Results  ?Component Value Date  ? WBC 5.1 09/08/2021  ? HGB 10.2 (L) 09/08/2021  ? HCT 31.2 (L) 09/08/2021  ? MCV 87.4 09/08/2021  ? PLT 278 09/08/2021  ? ?Lab Results  ?Component Value Date  ? NA 140 09/11/2021  ? K 3.4 (L) 09/11/2021  ? CO2 27 09/11/2021  ? GLUCOSE 117 (H) 09/11/2021  ? BUN 11 09/11/2021  ? CREATININE 0.84 09/11/2021  ? BILITOT 0.8 09/08/2021  ? ALKPHOS 31 (L) 09/08/2021  ? AST 56 (H) 09/08/2021  ? ALT 21 09/08/2021  ? PROT 6.7 09/08/2021  ? ALBUMIN 3.1 (L) 09/08/2021  ? CALCIUM 8.8 (L) 09/11/2021  ? ANIONGAP 4 (L) 09/11/2021  ? EGFR 38 (L) 06/07/2021  ? ?Lab Results  ?Component Value Date  ? CHOL 86 01/25/2021  ? ?Lab Results  ?Component Value Date  ? HDL 26 (L) 01/25/2021  ? ?Lab Results  ?Component Value Date  ? LDLCALC 45 01/25/2021  ? ?Lab Results  ?Component Value Date  ? TRIG  143 09/08/2021  ? ?Lab Results  ?Component Value Date  ? CHOLHDL 3.3 01/25/2021  ? ?Lab Results  ?Component Value Date  ? HGBA1C 6.7 (H) 07/13/2021  ? ? ?  ?Assessment & Plan:  ? ?1. Hospital discharge follow-up/[facility]

## 2021-09-20 NOTE — Patient Instructions (Addendum)
It was great seeing you today! ? ?Plan discussed at today's visit: ?-Follow up with GI in June ?-STOP all blood pressure pills ?-Drink an Ensure if you are not eating 3 meals a day, continue to monitor weight  ?-Continue Pantoprazole for acid reflux ?-Can discontinue both Crestor and fish oil for cholesterol if you want to cut back on pills ?-Should only take 3 pills - 2 vitamins and 1 stomach pill  ? ?Follow up in: 1 month  ? ?Take care and let us know if you have any questions or concerns prior to your next visit. ? ?Dr. Rosana Berger ? ?

## 2021-09-26 ENCOUNTER — Telehealth: Payer: Self-pay

## 2021-09-26 NOTE — Telephone Encounter (Signed)
Called and left voicemail with son, Randall Hiss, to ensure they do not have any questions regarding upcoming EUS on 09/29/21. Left contact number to return call with any questions. ?

## 2021-09-26 NOTE — Telephone Encounter (Signed)
Spoke to [place] and instructions reviewed. All questions answered. ?

## 2021-09-29 ENCOUNTER — Ambulatory Visit: Payer: Medicare HMO | Admitting: Certified Registered"

## 2021-09-29 ENCOUNTER — Encounter
Admission: RE | Disposition: A | Discharge status: Home / Self Care | Payer: Self-pay | Source: Home / Self Care | Attending: Internal Medicine

## 2021-09-29 ENCOUNTER — Encounter: Payer: Self-pay | Admitting: Internal Medicine

## 2021-09-29 ENCOUNTER — Ambulatory Visit
Admission: RE | Admit: 2021-09-29 | Discharge: 2021-09-29 | Disposition: A | Discharge status: Home / Self Care | Payer: Medicare HMO | Attending: Internal Medicine | Admitting: Internal Medicine

## 2021-09-29 DIAGNOSIS — R531 Weakness: Secondary | ICD-10-CM | POA: Diagnosis not present

## 2021-09-29 DIAGNOSIS — R748 Abnormal levels of other serum enzymes: Secondary | ICD-10-CM | POA: Insufficient documentation

## 2021-09-29 DIAGNOSIS — E46 Unspecified protein-calorie malnutrition: Secondary | ICD-10-CM | POA: Insufficient documentation

## 2021-09-29 DIAGNOSIS — K222 Esophageal obstruction: Secondary | ICD-10-CM | POA: Diagnosis not present

## 2021-09-29 DIAGNOSIS — E119 Type 2 diabetes mellitus without complications: Secondary | ICD-10-CM | POA: Insufficient documentation

## 2021-09-29 DIAGNOSIS — Z681 Body mass index (BMI) 19 or less, adult: Secondary | ICD-10-CM | POA: Insufficient documentation

## 2021-09-29 DIAGNOSIS — K8070 Calculus of gallbladder and bile duct without cholecystitis without obstruction: Secondary | ICD-10-CM | POA: Insufficient documentation

## 2021-09-29 DIAGNOSIS — E785 Hyperlipidemia, unspecified: Secondary | ICD-10-CM | POA: Diagnosis not present

## 2021-09-29 HISTORY — PX: EUS: SHX5427

## 2021-09-29 SURGERY — UPPER ENDOSCOPIC ULTRASOUND (EUS) LINEAR
Anesthesia: General

## 2021-09-29 MED ORDER — LIDOCAINE HCL (CARDIAC) PF 100 MG/5ML IV SOSY
PREFILLED_SYRINGE | INTRAVENOUS | Status: DC | PRN
Start: 2021-09-29 — End: 2021-09-29
  Administered 2021-09-29: 100 mg via INTRAVENOUS

## 2021-09-29 MED ORDER — LIDOCAINE HCL (PF) 2 % IJ SOLN
INTRAMUSCULAR | Status: AC
  Filled 2021-09-29: qty 5

## 2021-09-29 MED ORDER — LIDOCAINE HCL (PF) 1 % IJ SOLN
INTRAMUSCULAR | Status: AC
  Filled 2021-09-29: qty 2

## 2021-09-29 MED ORDER — PROPOFOL 10 MG/ML IV BOLUS
INTRAVENOUS | Status: DC | PRN
Start: 2021-09-29 — End: 2021-09-29
  Administered 2021-09-29: 20 mg via INTRAVENOUS
  Administered 2021-09-29 (×2): 10 mg via INTRAVENOUS
  Administered 2021-09-29: 20 mg via INTRAVENOUS
  Administered 2021-09-29: 70 mg via INTRAVENOUS

## 2021-09-29 MED ORDER — PHENYLEPHRINE 40 MCG/ML (10ML) SYRINGE FOR IV PUSH (FOR BLOOD PRESSURE SUPPORT)
PREFILLED_SYRINGE | INTRAVENOUS | Status: AC
  Filled 2021-09-29: qty 10

## 2021-09-29 MED ORDER — PROPOFOL 10 MG/ML IV BOLUS
INTRAVENOUS | Status: AC
  Filled 2021-09-29: qty 20

## 2021-09-29 MED ORDER — PHENYLEPHRINE 40 MCG/ML (10ML) SYRINGE FOR IV PUSH (FOR BLOOD PRESSURE SUPPORT)
PREFILLED_SYRINGE | INTRAVENOUS | Status: DC | PRN
Start: 2021-09-29 — End: 2021-09-29
  Administered 2021-09-29: 80 ug via INTRAVENOUS

## 2021-09-29 MED ORDER — SODIUM CHLORIDE 0.9 % IV SOLN: INTRAVENOUS | Status: DC

## 2021-09-29 NOTE — Anesthesia Postprocedure Evaluation (Signed)
Anesthesia Post Note ? ?Patient: BONNETTA ALLBEE ? ?Procedure(s) Performed: UPPER ENDOSCOPIC ULTRASOUND (EUS) LINEAR ? ?Patient location during evaluation: PACU ?Anesthesia Type: General ?Level of consciousness: sedated ?Pain management: satisfactory to patient ?Vital Signs Assessment: post-procedure vital signs reviewed and stable ?Respiratory status: respiratory function stable ?Cardiovascular status: stable ?Anesthetic complications: no ? ? ?No notable events documented. ? ? ?Last Vitals:  ?Vitals:  ? 09/29/21 1351 09/29/21 1401  ?BP: 135/78 135/82  ?Pulse: 70 81  ?Resp: 11 (!) 24  ?Temp: (!) 36.1 ?C   ?SpO2: 100% 100%  ?  ?Last Pain:  ?Vitals:  ? 09/29/21 1401  ?TempSrc:   ?PainSc: 0-No pain  ? ? ?  ?  ?  ?  ?  ?  ? ?VAN STAVEREN,Braxdon Gappa ? ? ? ? ?

## 2021-09-29 NOTE — Anesthesia Preprocedure Evaluation (Signed)
Anesthesia Evaluation  ?Patient identified by MRN, date of birth, ID band ?Patient awake ? ?General Assessment Comment:Somewhat slow in mentation, does not recall last procedure. ? ?Reviewed: ?Allergy & Precautions, NPO status , Patient's Chart, lab work & pertinent test results ? ?Airway ?Mallampati: II ? ?TM Distance: >3 FB ?Neck ROM: Full ? ? ? Dental ? ?(+) Lower Dentures, Upper Dentures ?  ?Pulmonary ?neg pulmonary ROS,  ?  ?Pulmonary exam normal ? ?+ decreased breath sounds ? ? ? ? ? Cardiovascular ?Exercise Tolerance: Poor ?hypertension, Pt. on medications ?negative cardio ROS ?Normal cardiovascular exam ?Rhythm:Regular  ? ?  ?Neuro/Psych ?negative neurological ROS ? negative psych ROS  ? GI/Hepatic ?negative GI ROS, Neg liver ROS,   ?Endo/Other  ?negative endocrine ROSdiabetes, Well Controlled, Type 2 ? Renal/GU ?negative Renal ROS  ?negative genitourinary ?  ?Musculoskeletal ?negative musculoskeletal ROS ?(+)  ? Abdominal ?Normal abdominal exam  (+)   ?Peds ?negative pediatric ROS ?(+)  Hematology ?negative hematology ROS ?(+)   ?Anesthesia Other Findings ?Past Medical History: ?No date: Diabetes mellitus without complication ([place]) ?No date: Gout ?No date: Hyperlipidemia ?No date: Hypertension ?No date: Obesity ? ?Past Surgical History: ?No date: ABDOMINAL HYSTERECTOMY ?09/11/2021: COLONOSCOPY WITH PROPOFOL; N/A ?    Comment:  Procedure: COLONOSCOPY WITH PROPOFOL;  Surgeon: Marius Ditch,  ?             Tally Due, MD;  Location: [facility] ENDOSCOPY;  Service:  ?             Gastroenterology;  Laterality: N/A; ?09/11/2021: ESOPHAGOGASTRODUODENOSCOPY (EGD) WITH PROPOFOL; N/A ?    Comment:  Procedure: ESOPHAGOGASTRODUODENOSCOPY (EGD) WITH  ?             PROPOFOL;  Surgeon: Lin Landsman, MD;  Location:  ?             [facility] ENDOSCOPY;  Service: Gastroenterology;  Laterality:  ?             N/A; ? ?BMI   ? Body Mass Index: 18.02 kg/m?  ?  ? ? Reproductive/Obstetrics ?negative OB  ROS ? ?  ? ? ? ? ? ? ? ? ? ? ? ? ? ?  ?  ? ? ? ? ? ? ? ? ?Anesthesia Physical ?Anesthesia Plan ? ?ASA: 3 ? ?Anesthesia Plan: General  ? ?Post-op Pain Management:   ? ?Induction: Intravenous ? ?PONV Risk Score and Plan: Propofol infusion and TIVA ? ?Airway Management Planned: Natural Airway and Nasal Cannula ? ?Additional Equipment:  ? ?Intra-op Plan:  ? ?Post-operative Plan:  ? ?Informed Consent: I have reviewed the patients History and Physical, chart, labs and discussed the procedure including the risks, benefits and alternatives for the proposed anesthesia with the patient or authorized representative who has indicated his/her understanding and acceptance.  ? ? ? ?Dental Advisory Given ? ?Plan Discussed with: CRNA and Surgeon ? ?Anesthesia Plan Comments:   ? ? ? ? ? ? ?Anesthesia Quick Evaluation ? ?

## 2021-09-29 NOTE — Transfer of Care (Signed)
Immediate Anesthesia Transfer of Care Note ? ?Patient: Angela Horton ? ?Procedure(s) Performed: UPPER ENDOSCOPIC ULTRASOUND (EUS) LINEAR ? ?Patient Location: PACU ? ?Anesthesia Type:General ? ?Level of Consciousness: drowsy ? ?Airway & Oxygen Therapy: Patient Spontanous Breathing and Patient connected to nasal cannula oxygen ? ?Post-op Assessment: Report given to RN and Post -op Vital signs reviewed and stable ? ?Post vital signs: Reviewed and stable ? ?Last Vitals:  ?Vitals Value Taken Time  ?BP 135/78 09/29/21 1352  ?Temp 36.1 ?C 09/29/21 1351  ?Pulse 70 09/29/21 1351  ?Resp 15 09/29/21 1353  ?SpO2 100 % 09/29/21 1351  ?Vitals shown include unvalidated device data. ? ?Last Pain:  ?Vitals:  ? 09/29/21 1351  ?TempSrc: Temporal  ?PainSc: Asleep  ?   ? ?  ? ?Complications: No notable events documented. ?

## 2021-09-29 NOTE — Discharge Instructions (Addendum)
Discharge to home °

## 2021-09-29 NOTE — Op Note (Addendum)
[facility] ?Gastroenterology ?Patient Name: Angela Horton ?Procedure Date: 09/29/2021 10:37 AM ?MRN: [id number] ?Account #: [id number] ?Date of Birth: [birth year] ?Admit Type: Outpatient ?Age: 81 ?Room: [facility] ENDO ROOM 4 ?Gender: Female ?Note Status: Finalized ?Instrument Name: [id number],Linear EUS Scope [id number] ?Procedure:             Upper EUS ?Indications:           Common bile duct dilation (acquired) seen on MRI,  ?                       Elevated liver enzymes ?Patient Profile:       Refer to note in patient chart for documentation of  ?                       history and physical. ?Providers:             Murray Hodgkins. Damoni Erker ?Referring MD:          Delsa Grana (Referring MD), Lin Landsman MD, MD  ?                       (Referring MD) ?Medicines:             Propofol per Anesthesia ?Complications:         No immediate complications. ?Procedure:             Pre-Anesthesia Assessment: ?                       - Prior to the procedure, a History and Physical was  ?                       performed, and patient medications and allergies were  ?                       reviewed. The patient is competent. The risks and  ?                       benefits of the procedure and the sedation options and  ?                       risks were discussed with the patient. All questions  ?                       were answered and informed consent was obtained.  ?                       Patient identification and proposed procedure were  ?                       verified by the physician, the nurse and the  ?                       anesthetist in the pre-procedure area. Mental Status  ?                       Examination: alert and oriented. Airway Examination:  ?                       normal oropharyngeal airway and neck mobility.  ?  Respiratory Examination: clear to auscultation. CV  ?                       Examination: normal. Prophylactic Antibiotics: The  ?                        patient does not require prophylactic antibiotics.  ?                       Prior Anticoagulants: The patient has taken no  ?                       previous anticoagulant or antiplatelet agents. ASA  ?                       Grade Assessment: III - A patient with severe systemic  ?                       disease. After reviewing the risks and benefits, the  ?                       patient was deemed in satisfactory condition to  ?                       undergo the procedure. The anesthesia plan was to use  ?                       monitored anesthesia care (MAC). Immediately prior to  ?                       administration of medications, the patient was  ?                       re-assessed for adequacy to receive sedatives. The  ?                       heart rate, respiratory rate, oxygen saturations,  ?                       blood pressure, adequacy of pulmonary ventilation, and  ?                       response to care were monitored throughout the  ?                       procedure. The physical status of the patient was  ?                       re-assessed after the procedure. ?                       After obtaining informed consent, the endoscope was  ?                       passed under direct vision. Throughout the procedure,  ?                       the patient's blood pressure, pulse, and oxygen  ?  saturations were monitored continuously. The  ?                       Duodenoscope was introduced through the mouth, and  ?                       advanced to the second part of duodenum. The Endoscope  ?                       was introduced through the anus and advanced to the  ?                       duodenum for ultrasound examination from the  ?                       esophagus, stomach and duodenum. After obtaining  ?                       informed consent, the endoscope was passed under  ?                       direct vision. Throughout the procedure, the patient's  ?                        blood pressure, pulse, and oxygen saturations were  ?                       monitored continuously.The upper EUS was accomplished  ?                       without difficulty. The patient tolerated the  ?                       procedure well. ?Findings: ?     ENDOSCOPIC FINDING: : ?     The entire examined stomach was endoscopically normal. ?     The ampulla, duodenal bulb, first portion of the duodenum and second  ?     portion of the duodenum were normal. ?     ENDOSONOGRAPHIC FINDING: : ?     One stone and sludge was visualized endosonographically in the common  ?     bile duct. The stone measured 4 mm in greatest dimension. The stone was  ?     round. It was hyperechoic and characterized by shadowing. The bile duct  ?     was dilated and measured up to 10 mm in diameter. ?     Moderate hyperechoic material consistent with sludge was visualized  ?     endosonographically in the gallbladder. ?     There was no sign of significant endosonographic parenchymal or ductal  ?     abnormality in the pancreatic head, genu of the pancreas, pancreatic  ?     body and pancreatic tail. The PD measured up to 2.5 mm in the head of  ?     pancreas. ?     Endosonographic imaging in the left lobe of the liver showed no  ?     abnormalities. ?     The celiac region was visualized and showed no sign of significant  ?     endosonographic abnormality. ?Impression:  EGD Impressions: ?                       - The esophagus was not well visualized with the  ?                       duodenoscope. ?                       - Normal stomach. ?                       - Normal ampulla, duodenal bulb, first portion of the  ?                       duodenum and second portion of the duodenum. ?                       EUS Impressions: ?                       - One stone was visualized endosonographically in the  ?                       common bile duct. The bile duct was dilated, likely  ?                       secondary to stone disease. No  obstructing ampullary  ?                       lesion seen. ?                       - Hyperechoic material consistent with sludge was  ?                       visualized endosonographically in the gallbladder. ?                       - There was no sign of significant pathology in the  ?                       pancreatic head, genu of the pancreas, pancreatic body  ?                       and pancreatic tail. ?                       - Normal visualized portions of the liver. ?                       - Normal celiac region. ?                       - No specimens collected. ?Recommendation:        - Discharge patient to home (ambulatory). ?                       - Consider ERCP for bile duct stone removal followed  ?  by general surgery referral for consideration of  ?                       cholecystectomy (to be arranged by the referring  ?                       [place] providers). ?                       - The findings and recommendations were discussed with  ?                       the patient. ?                       - Return to referring physician as previously  ?                       scheduled. Further plan of care to be determined by  ?                       the referring physicians. ?Procedure Code(s):     --- Professional --- ?                       [phone], Esophagogastroduodenoscopy, flexible,  ?                       transoral; with endoscopic ultrasound examination  ?                       limited to the esophagus, stomach or duodenum, and  ?                       adjacent structures ?Diagnosis Code(s):     --- Professional --- ?                       K83.8, Other specified diseases of biliary tract ?                       R74.8, Abnormal levels of other serum enzymes ?                       K80.50, Calculus of bile duct without cholangitis or  ?                       cholecystitis without obstruction ?CPT copyright 2019 American Medical Association. All rights reserved. ?The codes  documented in this report are preliminary and upon coder review may  ?be revised to meet current compliance requirements. ?Attending Participation: ?     I personally performed the entire procedure without th

## 2021-09-29 NOTE — Interval H&P Note (Signed)
History and Physical Interval Note: ? ?09/29/2021 ?1:00 PM ? ?Angela Horton  has presented today for surgery, with the diagnosis of intra and extrahepatic biliary dilation.  The various methods of treatment have been discussed with the patient and family. After consideration of risks, benefits and other options for treatment, the patient has consented to  Procedure(s): ?UPPER ENDOSCOPIC ULTRASOUND (EUS) LINEAR (N/A) as a surgical intervention.  The patient's history has been reviewed, patient examined, no change in status, stable for surgery.  I have reviewed the patient's chart and labs.  Questions were answered to the patient's satisfaction.   ? ? ?[name] ? ? ?

## 2021-09-30 ENCOUNTER — Encounter: Payer: Self-pay | Admitting: Internal Medicine

## 2021-10-03 ENCOUNTER — Telehealth: Payer: Self-pay | Admitting: Gastroenterology

## 2021-10-03 NOTE — Telephone Encounter (Signed)
Patient relative states that patient is to have an ERCT with Dr Allen Norris and is ready to schedule that. Requesting a call back at [phone].  ?

## 2021-10-08 DIAGNOSIS — Z20828 Contact with and (suspected) exposure to other viral communicable diseases: Secondary | ICD-10-CM | POA: Diagnosis not present

## 2021-10-12 NOTE — Telephone Encounter (Signed)
Daughter is not on DPR just husband and son... called pt, no answer and VM is full ?

## 2021-10-19 ENCOUNTER — Encounter: Payer: Self-pay | Admitting: Internal Medicine

## 2021-10-19 ENCOUNTER — Ambulatory Visit: Payer: Medicare HMO | Admitting: Internal Medicine

## 2021-10-19 ENCOUNTER — Telehealth: Payer: Self-pay

## 2021-10-19 NOTE — Telephone Encounter (Signed)
Please contact pt's son, Genavieve Mangiapane to schedule ERCP. [phone].  ?

## 2021-10-19 NOTE — Telephone Encounter (Signed)
Spoke with [facility] and they are trying to contact patient/son/daughter for scheduling. Their voicemail is full and unable to leave message for follow up. ?

## 2021-10-21 ENCOUNTER — Other Ambulatory Visit: Payer: Self-pay | Admitting: Gastroenterology

## 2021-10-21 DIAGNOSIS — K8050 Calculus of bile duct without cholangitis or cholecystitis without obstruction: Secondary | ICD-10-CM

## 2021-10-21 NOTE — Telephone Encounter (Signed)
Left message on voicemail ? ?Pt has been scheduled for ERCP 10/27/21 and I will release letter via mychart with message  ?

## 2021-10-24 DIAGNOSIS — Z961 Presence of intraocular lens: Secondary | ICD-10-CM | POA: Diagnosis not present

## 2021-10-24 DIAGNOSIS — H35033 Hypertensive retinopathy, bilateral: Secondary | ICD-10-CM | POA: Diagnosis not present

## 2021-10-24 DIAGNOSIS — Z0100 Encounter for examination of eyes and vision without abnormal findings: Secondary | ICD-10-CM | POA: Diagnosis not present

## 2021-10-24 DIAGNOSIS — H26491 Other secondary cataract, right eye: Secondary | ICD-10-CM | POA: Diagnosis not present

## 2021-10-27 ENCOUNTER — Ambulatory Visit
Admission: RE | Admit: 2021-10-27 | Discharge: 2021-10-27 | Disposition: A | Discharge status: Home / Self Care | Payer: Medicare HMO | Source: Ambulatory Visit | Attending: Gastroenterology | Admitting: Gastroenterology

## 2021-10-27 ENCOUNTER — Ambulatory Visit: Payer: Medicare HMO | Admitting: Anesthesiology

## 2021-10-27 ENCOUNTER — Encounter: Payer: Self-pay | Admitting: Gastroenterology

## 2021-10-27 ENCOUNTER — Encounter
Admission: RE | Disposition: A | Discharge status: Home / Self Care | Payer: Self-pay | Source: Ambulatory Visit | Attending: Gastroenterology

## 2021-10-27 ENCOUNTER — Ambulatory Visit: Payer: Medicare HMO

## 2021-10-27 DIAGNOSIS — D132 Benign neoplasm of duodenum: Secondary | ICD-10-CM | POA: Insufficient documentation

## 2021-10-27 DIAGNOSIS — K8050 Calculus of bile duct without cholangitis or cholecystitis without obstruction: Secondary | ICD-10-CM | POA: Insufficient documentation

## 2021-10-27 DIAGNOSIS — I10 Essential (primary) hypertension: Secondary | ICD-10-CM | POA: Insufficient documentation

## 2021-10-27 DIAGNOSIS — E785 Hyperlipidemia, unspecified: Secondary | ICD-10-CM | POA: Diagnosis not present

## 2021-10-27 DIAGNOSIS — E119 Type 2 diabetes mellitus without complications: Secondary | ICD-10-CM | POA: Diagnosis not present

## 2021-10-27 HISTORY — PX: ERCP: SHX5425

## 2021-10-27 LAB — BASIC METABOLIC PANEL
Anion gap: 11 (ref 5–15)
BUN: 20 mg/dL (ref 8–23)
CO2: 27 mmol/L (ref 22–32)
Calcium: 9.4 mg/dL (ref 8.9–10.3)
Chloride: 98 mmol/L (ref 98–111)
Creatinine, Ser: 1 mg/dL (ref 0.44–1.00)
GFR, Estimated: 56 mL/min — ABNORMAL LOW (ref >60)
Glucose, Bld: 114 mg/dL — ABNORMAL HIGH (ref 70–99)
Potassium: 3.2 mmol/L — ABNORMAL LOW (ref 3.5–5.1)
Sodium: 136 mmol/L (ref 135–145)

## 2021-10-27 LAB — GLUCOSE, CAPILLARY: Glucose-Capillary: 25 mg/dL — CL (ref 70–99)

## 2021-10-27 SURGERY — ERCP, WITH INTERVENTION IF INDICATED
Anesthesia: General

## 2021-10-27 MED ORDER — LIDOCAINE HCL (PF) 2 % IJ SOLN
INTRAMUSCULAR | Status: AC
  Filled 2021-10-27: qty 5

## 2021-10-27 MED ORDER — DEXTROSE 50 % IV SOLN
INTRAVENOUS | Status: AC
  Filled 2021-10-27: qty 50

## 2021-10-27 MED ORDER — PROPOFOL 500 MG/50ML IV EMUL
INTRAVENOUS | Status: AC
  Filled 2021-10-27: qty 50

## 2021-10-27 MED ORDER — LIDOCAINE HCL (CARDIAC) PF 100 MG/5ML IV SOSY
PREFILLED_SYRINGE | INTRAVENOUS | Status: DC | PRN
  Administered 2021-10-27: 40 mg via INTRAVENOUS

## 2021-10-27 MED ORDER — PHENYLEPHRINE HCL (PRESSORS) 10 MG/ML IV SOLN
INTRAVENOUS | Status: DC | PRN
  Administered 2021-10-27: 80 ug via INTRAVENOUS

## 2021-10-27 MED ORDER — SODIUM CHLORIDE 0.9 % IV SOLN: INTRAVENOUS | Status: DC

## 2021-10-27 MED ORDER — INDOMETHACIN 50 MG RE SUPP
100.0000 mg | Freq: Once | RECTAL | Status: AC
  Administered 2021-10-27: 100 mg via RECTAL

## 2021-10-27 MED ORDER — PROPOFOL 500 MG/50ML IV EMUL
INTRAVENOUS | Status: DC | PRN
  Administered 2021-10-27: 50 ug/kg/min via INTRAVENOUS

## 2021-10-27 MED ORDER — DEXTROSE 50 % IV SOLN
50.0000 mL | Freq: Once | INTRAVENOUS | Status: AC
  Administered 2021-10-27: 50 mL via INTRAVENOUS

## 2021-10-27 MED ORDER — INDOMETHACIN 50 MG RE SUPP
RECTAL | Status: AC
  Filled 2021-10-27: qty 2

## 2021-10-27 MED ORDER — PROPOFOL 10 MG/ML IV BOLUS
INTRAVENOUS | Status: DC | PRN
  Administered 2021-10-27: 20 mg via INTRAVENOUS
  Administered 2021-10-27: 10 mg via INTRAVENOUS
  Administered 2021-10-27: 20 mg via INTRAVENOUS

## 2021-10-27 NOTE — Anesthesia Preprocedure Evaluation (Signed)
Anesthesia Evaluation  ?Patient identified by MRN, date of birth, ID band ?Patient awake ? ? ? ?Reviewed: ?Allergy & Precautions, NPO status , Patient's Chart, lab work & pertinent test results ? ?Airway ?Mallampati: II ? ?TM Distance: >3 FB ?Neck ROM: full ? ? ? Dental ? ?(+) Edentulous Upper, Edentulous Lower ?  ?Pulmonary ?neg pulmonary ROS,  ?  ?Pulmonary exam normal ?breath sounds clear to auscultation ? ? ? ? ? ? Cardiovascular ?Exercise Tolerance: Poor ?hypertension, Pt. on medications ?negative cardio ROS ?Normal cardiovascular exam ?Rhythm:Regular  ? ?  ?Neuro/Psych ?negative neurological ROS ? negative psych ROS  ? GI/Hepatic ?negative GI ROS, Neg liver ROS,   ?Endo/Other  ?negative endocrine ROSdiabetes ? Renal/GU ?negative Renal ROS  ?negative genitourinary ?  ?Musculoskeletal ?negative musculoskeletal ROS ?(+)  ? Abdominal ?Normal abdominal exam  (+)   ?Peds ?negative pediatric ROS ?(+)  Hematology ?negative hematology ROS ?(+)   ?Anesthesia Other Findings ?Past Medical History: ?No date: Diabetes mellitus without complication ([place]) ?No date: Gout ?No date: Hyperlipidemia ?No date: Hypertension ?No date: Obesity ? ?Past Surgical History: ?No date: ABDOMINAL HYSTERECTOMY ?09/11/2021: COLONOSCOPY WITH PROPOFOL; N/A ?    Comment:  Procedure: COLONOSCOPY WITH PROPOFOL;  Surgeon: Marius Ditch,  ?             Tally Due, MD;  Location: [facility] ENDOSCOPY;  Service:  ?             Gastroenterology;  Laterality: N/A; ?09/11/2021: ESOPHAGOGASTRODUODENOSCOPY (EGD) WITH PROPOFOL; N/A ?    Comment:  Procedure: ESOPHAGOGASTRODUODENOSCOPY (EGD) WITH  ?             PROPOFOL;  Surgeon: Lin Landsman, MD;  Location:  ?             [facility] ENDOSCOPY;  Service: Gastroenterology;  Laterality:  ?             N/A; ?09/29/2021: EUS; N/A ?    Comment:  Procedure: UPPER ENDOSCOPIC ULTRASOUND (EUS) LINEAR;   ?             Surgeon: Burbridge, Murray Hodgkins, MD;  Location: [place]  ?             ENDOSCOPY;   Service: Gastroenterology;  Laterality: N/A; ? ?BMI   ? Body Mass Index: 18.02 kg/m?  ?  ? ? Reproductive/Obstetrics ?negative OB ROS ? ?  ? ? ? ? ? ? ? ? ? ? ? ? ? ?  ?  ? ? ? ? ? ? ? ? ?Anesthesia Physical ?Anesthesia Plan ? ?ASA: 3 ? ?Anesthesia Plan: General  ? ?Post-op Pain Management:   ? ?Induction: Intravenous ? ?PONV Risk Score and Plan: Propofol infusion and TIVA ? ?Airway Management Planned: Natural Airway and Nasal Cannula ? ?Additional Equipment:  ? ?Intra-op Plan:  ? ?Post-operative Plan:  ? ?Informed Consent: I have reviewed the patients History and Physical, chart, labs and discussed the procedure including the risks, benefits and alternatives for the proposed anesthesia with the patient or authorized representative who has indicated his/her understanding and acceptance.  ? ? ? ?Dental Advisory Given ? ?Plan Discussed with: CRNA and Surgeon ? ?Anesthesia Plan Comments:   ? ? ? ? ? ? ?Anesthesia Quick Evaluation ? ?

## 2021-10-27 NOTE — H&P (Signed)
? ?Angela Lame, MD [facility] ?[address] ?[place], [place] [address] ?Phone:[phone] ?Fax : [phone] ? ?Primary Care Physician:  Delsa Grana, PA-C ?Primary Gastroenterologist:  Dr. Allen Norris ? ?Pre-Procedure History & Physical: ?HPI:  Angela Horton is a 81 y.o. female is here for an ERCP. ?  ?Past Medical History:  ?Diagnosis Date  ? Diabetes mellitus without complication ([place])   ? Gout   ? Hyperlipidemia   ? Hypertension   ? Obesity   ? ? ?Past Surgical History:  ?Procedure Laterality Date  ? ABDOMINAL HYSTERECTOMY    ? COLONOSCOPY WITH PROPOFOL N/A 09/11/2021  ? Procedure: COLONOSCOPY WITH PROPOFOL;  Surgeon: Lin Landsman, MD;  Location: [facility] ENDOSCOPY;  Service: Gastroenterology;  Laterality: N/A;  ? ESOPHAGOGASTRODUODENOSCOPY (EGD) WITH PROPOFOL N/A 09/11/2021  ? Procedure: ESOPHAGOGASTRODUODENOSCOPY (EGD) WITH PROPOFOL;  Surgeon: Lin Landsman, MD;  Location: [facility] ENDOSCOPY;  Service: Gastroenterology;  Laterality: N/A;  ? EUS N/A 09/29/2021  ? Procedure: UPPER ENDOSCOPIC ULTRASOUND (EUS) LINEAR;  Surgeon: Holly Bodily, MD;  Location: [facility] ENDOSCOPY;  Service: Gastroenterology;  Laterality: N/A;  ? ? ?Prior to Admission medications   ?Medication Sig Start Date End Date Taking? Authorizing Provider  ?brimonidine (ALPHAGAN) 0.2 % ophthalmic solution Place 1 drop into both eyes 2 (two) times daily. 01/01/15  Yes [provider]  ?feeding supplement (ENSURE ENLIVE / ENSURE PLUS) LIQD Take 237 mLs by mouth 2 (two) times daily between meals. 09/11/21  Yes Lorella Nimrod, MD  ?ferrous [id number] C-folic acid (TRINSICON / FOLTRIN) capsule Take 1 capsule by mouth 2 (two) times daily after a meal. 09/11/21  Yes Lorella Nimrod, MD  ?Multiple Vitamins-Minerals (ONE-A-DAY WOMENS PETITES) TABS Take 1 tablet by mouth in the morning and at bedtime.   Yes [provider]  ?pantoprazole (PROTONIX) 40 MG tablet Take 1 tablet (40 mg total) by mouth 2 (two) times daily before a  meal. 09/11/21  Yes Lorella Nimrod, MD  ? ? ?Allergies as of 10/21/2021 - Review Complete 09/29/2021  ?Allergen Reaction Noted  ? Metformin and related  11/30/2017  ? ? ?Family History  ?Problem Relation Age of Onset  ? Kidney disease Mother   ? Kidney disease Sister   ? Stroke Brother   ? Heart disease Sister   ? Stroke Sister   ? ? ?Social History  ? ?Socioeconomic History  ? Marital status: Married  ?  Spouse name: Not on file  ? Number of children: 4  ? Years of education: Not on file  ? Highest education level: Not on file  ?Occupational History  ? Not on file  ?Tobacco Use  ? Smoking status: Never  ? Smokeless tobacco: Never  ? Tobacco comments:  ?  Smoking cessation materials not required  ?Vaping Use  ? Vaping Use: Never used  ?Substance and Sexual Activity  ? Alcohol use: No  ?  Alcohol/week: 0.0 standard drinks  ? Drug use: No  ? Sexual activity: Yes  ?Other Topics Concern  ? Not on file  ?Social History Narrative  ? Not on file  ? ?Social Determinants of Health  ? ?Financial Resource Strain: Low Risk   ? Difficulty of Paying Living Expenses: Not hard at all  ?Food Insecurity: No Food Insecurity  ? Worried About Charity fundraiser in the Last Year: Never true  ? Ran Out of Food in the Last Year: Never true  ?Transportation Needs: No Transportation Needs  ? Lack of Transportation (Medical): No  ? Lack of Transportation (  Non-Medical): No  ?Physical Activity: Inactive  ? Days of Exercise per Week: 0 days  ? Minutes of Exercise per Session: 0 min  ?Stress: No Stress Concern Present  ? Feeling of Stress : Not at all  ?Social Connections: Moderately Integrated  ? Frequency of Communication with Friends and Family: More than three times a week  ? Frequency of Social Gatherings with Friends and Family: More than three times a week  ? Attends Religious Services: More than 4 times per year  ? Active Member of Clubs or Organizations: No  ? Attends Archivist Meetings: Never  ? Marital Status: Married   ?Intimate Partner Violence: Not At Risk  ? Fear of Current or Ex-Partner: No  ? Emotionally Abused: No  ? Physically Abused: No  ? Sexually Abused: No  ? ? ?Review of Systems: ?See HPI, otherwise negative ROS ? ?Physical Exam: ?BP 97/69   Pulse 87   Temp (!) 96 ?F (35.6 ?C) (Temporal)   Resp 16   Ht '5\' 9"'$  (1.753 m)   Wt 55.3 kg   BMI 18.02 kg/m?  ?General:   Alert,  pleasant and cooperative in NAD ?Head:  Normocephalic and atraumatic. ?Neck:  Supple; no masses or thyromegaly. ?Lungs:  Clear throughout to auscultation.    ?Heart:  Regular rate and rhythm. ?Abdomen:  Soft, nontender and nondistended. Normal bowel sounds, without guarding, and without rebound.   ?Neurologic:  Alert and  oriented x4;  grossly normal neurologically. ? ?Impression/Plan: ?Angela Horton is here for an ERCP to be performed for CBD stone ? ?Risks, benefits, limitations, and alternatives regarding  ERCP have been reviewed with the patient.  Questions have been answered.  All parties agreeable. ? ? ?Angela Lame, MD  10/27/2021, 12:02 PM ?

## 2021-10-27 NOTE — Transfer of Care (Signed)
Immediate Anesthesia Transfer of Care Note ? ?Patient: Angela Horton ? ?Procedure(s) Performed: ENDOSCOPIC RETROGRADE CHOLANGIOPANCREATOGRAPHY (ERCP) ? ?Patient Location: PACU ? ?Anesthesia Type:General ? ?Level of Consciousness: sedated ? ?Airway & Oxygen Therapy: Patient Spontanous Breathing and Patient connected to nasal cannula oxygen ? ?Post-op Assessment: Report given to RN and Post -op Vital signs reviewed and stable ? ?Post vital signs: Reviewed and stable ? ?Last Vitals:  ?Vitals Value Taken Time  ?BP 137/87 10/27/21 1301  ?Temp 36.2 ?C 10/27/21 1301  ?Pulse 69 10/27/21 1301  ?Resp 14 10/27/21 1303  ?SpO2    ?Vitals shown include unvalidated device data. ? ?Last Pain:  ?Vitals:  ? 10/27/21 1301  ?TempSrc: Temporal  ?PainSc: Asleep  ?   ? ?  ? ?Complications: No notable events documented. ?

## 2021-10-27 NOTE — Op Note (Signed)
[facility] ?Gastroenterology ?Patient Name: Angela Horton ?Procedure Date: 10/27/2021 12:03 PM ?MRN: [id number] ?Account #: [id number] ?Date of Birth: 12-06-1940 ?Admit Type: Outpatient ?Age: 81 ?Room: [facility] ENDO ROOM 4 ?Gender: Female ?Note Status: Finalized ?Instrument Name: EXALT Ercp scope ?Procedure:             ERCP ?Indications:           Common bile duct stone(s) ?Providers:             Lucilla Lame MD, MD ?Referring MD:          Delsa Grana (Referring MD) ?Medicines:             Propofol per Anesthesia ?Complications:         No immediate complications. ?Procedure:             Pre-Anesthesia Assessment: ?                       - Prior to the procedure, a History and Physical was  ?                       performed, and patient medications and allergies were  ?                       reviewed. The patient's tolerance of previous  ?                       anesthesia was also reviewed. The risks and benefits  ?                       of the procedure and the sedation options and risks  ?                       were discussed with the patient. All questions were  ?                       answered, and informed consent was obtained. Prior  ?                       Anticoagulants: The patient has taken no previous  ?                       anticoagulant or antiplatelet agents. ASA Grade  ?                       Assessment: II - A patient with mild systemic disease.  ?                       After reviewing the risks and benefits, the patient  ?                       was deemed in satisfactory condition to undergo the  ?                       procedure. ?                       After obtaining informed consent, the scope was passed  ?  under direct vision. Throughout the procedure, the  ?                       patient's blood pressure, pulse, and oxygen  ?                       saturations were monitored continuously. The [employer]  ?                       Scientific [employer] D single  use duodenoscope was  ?                       introduced through the mouth, and used to inject  ?                       contrast into and used to inject contrast into the  ?                       bile duct. The ERCP was accomplished without  ?                       difficulty. The patient tolerated the procedure well. ?Findings: ?     The scout film was normal. The major papilla was bulging. The bile duct  ?     was deeply cannulated with the short-nosed traction sphincterotome.  ?     Contrast was injected. I personally interpreted the bile duct images.  ?     There was brisk flow of contrast through the ducts. Image quality was  ?     excellent. Contrast extended to the entire biliary tree. The main bile  ?     duct was moderately dilated. A wire was passed into the biliary tree. A  ?     7 mm biliary sphincterotomy was made with a traction (standard)  ?     sphincterotome using ERBE electrocautery. There was no  ?     post-sphincterotomy bleeding. The biliary tree was swept with a 15 mm  ?     balloon starting at the bifurcation. Sludge was swept from the duct. The  ?     ampulla was biopsied with a cold forceps for histology. ?Impression:            - The major papilla appeared to be bulging. ?                       - The entire main bile duct was moderately dilated. ?                       - A biliary sphincterotomy was performed. ?                       - The biliary tree was swept and sludge was found. ?                       - Biopsy was performed The ampulla. ?Recommendation:        - Discharge patient to home. ?                       - Clear liquid diet today. ?Procedure Code(s):     ---  Professional --- ?                       [phone], Endoscopic retrograde cholangiopancreatography  ?                       (ERCP); with removal of calculi/debris from  ?                       biliary/pancreatic duct(s) ?                       38453, Endoscopic retrograde cholangiopancreatography  ?                        (ERCP); with sphincterotomy/papillotomy ?                       64680, Endoscopic retrograde cholangiopancreatography  ?                       (ERCP); with biopsy, single or multiple ?                       74328, Endoscopic catheterization of the biliary  ?                       ductal system, radiological supervision and  ?                       interpretation ?Diagnosis Code(s):     --- Professional --- ?                       K80.50, Calculus of bile duct without cholangitis or  ?                       cholecystitis without obstruction ?                       K83.8, Other specified diseases of biliary tract ?CPT copyright 2019 American Medical Association. All rights reserved. ?The codes documented in this report are preliminary and upon coder review may  ?be revised to meet current compliance requirements. ?Lucilla Lame MD, MD ?10/27/2021 12:55:57 PM ?This report has been signed electronically. ?Number of Addenda: 0 ?Note Initiated On: 10/27/2021 12:03 PM ?Estimated Blood Loss:  Estimated blood loss: none. ?     [facility] ?

## 2021-10-27 NOTE — Anesthesia Procedure Notes (Signed)
Procedure Name: [place] ?Date/Time: 10/27/2021 12:23 PM ?Performed by: Vaughan Sine ?Pre-anesthesia Checklist: Patient identified, Emergency Drugs available, Suction available, Patient being monitored and Timeout performed ?Patient Re-evaluated:Patient Re-evaluated prior to induction ?Oxygen Delivery Method: Nasal cannula ?Preoxygenation: Pre-oxygenation with 100% oxygen ?Induction Type: IV induction ?Placement Confirmation: CO2 detector and positive ETCO2 ? ? ? ? ?

## 2021-10-28 ENCOUNTER — Encounter: Payer: Self-pay | Admitting: Gastroenterology

## 2021-10-28 LAB — SURGICAL PATHOLOGY

## 2021-10-28 NOTE — Anesthesia Postprocedure Evaluation (Signed)
Anesthesia Post Note ? ?Patient: Angela Horton ? ?Procedure(s) Performed: ENDOSCOPIC RETROGRADE CHOLANGIOPANCREATOGRAPHY (ERCP) ? ?Patient location during evaluation: PACU ?Anesthesia Type: General ?Level of consciousness: sedated ?Pain management: satisfactory to patient ?Vital Signs Assessment: post-procedure vital signs reviewed and stable ?Respiratory status: spontaneous breathing and nonlabored ventilation ?Cardiovascular status: stable ?Anesthetic complications: no ? ? ?No notable events documented. ? ? ?Last Vitals:  ?Vitals:  ? 10/27/21 1301 10/27/21 1311  ?BP: 137/87 (!) 145/92  ?Pulse: 69   ?Resp: 16   ?Temp: (!) 36.2 ?C   ?  ?Last Pain:  ?Vitals:  ? 10/27/21 1321  ?TempSrc:   ?PainSc: 0-No pain  ? ? ?  ?  ?  ?  ?  ?  ? ?VAN STAVEREN,Lamine Laton ? ? ? ? ?

## 2021-10-31 ENCOUNTER — Ambulatory Visit (INDEPENDENT_AMBULATORY_CARE_PROVIDER_SITE_OTHER): Payer: Medicare HMO | Admitting: Internal Medicine

## 2021-10-31 ENCOUNTER — Telehealth: Payer: Self-pay

## 2021-10-31 ENCOUNTER — Encounter: Payer: Self-pay | Admitting: Internal Medicine

## 2021-10-31 VITALS — BP 110/70 | HR 75 | Temp 97.5°F | Resp 16 | Ht 68.0 in | Wt 106.0 lb

## 2021-10-31 DIAGNOSIS — E46 Unspecified protein-calorie malnutrition: Secondary | ICD-10-CM

## 2021-10-31 DIAGNOSIS — R634 Abnormal weight loss: Secondary | ICD-10-CM | POA: Diagnosis not present

## 2021-10-31 MED ORDER — MIRTAZAPINE 7.5 MG PO TABS: 7.5000 mg | ORAL_TABLET | Freq: Every day | ORAL | 0 refills | Status: DC

## 2021-10-31 NOTE — Patient Instructions (Addendum)
It was great seeing you today! ? ?Plan discussed at today's visit: ?-Continue Ensure 3 times a day if not finishing meals ?-New medication sent to pharmacy to take once a day for appetite, Remeron 7.5 mg daily  ? ?Follow up in: 6 weeks ? ?Take care and let us know if you have any questions or concerns prior to your next visit. ? ?Dr. Rosana Berger ? ?High-Protein and High-Calorie Diet ?Eating high-protein and high-calorie foods can help you to gain weight, heal after an injury, and recover after an illness or surgery. The specific amount of daily protein and calories you need depends on: ?Your body weight. ?The reason this diet is recommended for you. ?Generally, a high-protein, high-calorie diet involves: ?Eating 250-500 extra calories each day. ?Making sure that you get enough of your daily calories from protein. Ask your health care provider how many of your calories should come from protein. ?Talk with a health care provider or a dietitian about how much protein and how many calories you need each day. Follow the diet as directed by your health care provider. ?What are tips for following this plan? ? ?Reading food labels ?Check the nutrition facts label for calories, grams of fat and protein. Items with more than 4 grams of protein are high-protein foods. ?Preparing meals ?Add whole milk, half-and-half, or heavy cream to cereal, pudding, soup, or hot cocoa. ?Add whole milk to instant breakfast drinks. ?Add peanut butter to oatmeal or smoothies. ?Add powdered milk to baked goods, smoothies, or milkshakes. ?Add powdered milk, cream, or butter to mashed potatoes. ?Add cheese to cooked vegetables. ?Make whole-milk yogurt parfaits. Top them with granola, fruit, or nuts. ?Add cottage cheese to fruit. ?Add avocado, cheese, or both to sandwiches or salads. Add avocado to smoothies. ?Add meat, poultry, or seafood to rice, pasta, casseroles, salads, and soups. ?Use mayonnaise when making egg salad, chicken salad, or tuna  salad. ?Use peanut butter as a dip for fruits and vegetables or as a topping for pretzels, celery, or crackers. ?Add beans to casseroles, dips, and spreads. ?Add pureed beans to sauces and soups. ?Replace calorie-free drinks with calorie-containing drinks, such as milk and fruit juice. ?Replace water with milk or heavy cream when making foods such as oatmeal, pudding, or cocoa. ?Add oil or butter to cooked vegetables and grains. ?Add cream cheese to sandwiches or as a topping on crackers and bread. ?Make cream-based pastas and soups. ?General information ?Ask your health care provider if you should take a nutritional supplement. ?Try to eat six small meals each day instead of three large meals. A general goal is to eat every 2 to 3 hours. ?Eat a balanced diet. In each meal, include one food that is high in protein and one food with fat in it. ?Keep nutritious snacks available, such as nuts, trail mixes, dried fruit, and yogurt. ?If you have kidney disease or diabetes, talk with your health care provider about how much protein is safe for you. Too much protein may put extra stress on your kidneys. ?Drink your calories. Choose high-calorie drinks and have them after your meals. ?Consider setting a timer to remind you to eat. You will want to eat even if you do not feel very hungry. ?What high-protein foods should I eat? ? ?Vegetables ?Soybeans. Peas. ?Grains ?Quinoa. Bulgur wheat. Buckwheat. ?Meats and other proteins ?Beef, pork, and poultry. Fish and seafood. Eggs. Tofu. Textured vegetable protein (TVP). Peanut butter. Nuts and seeds. Dried beans. Protein powders. Hummus. ?Dairy ?Whole milk. Whole-milk yogurt. Powdered  milk. Cheese. [employer]. Eggnog. ?Beverages ?High-protein supplement drinks. Soy milk. ?Other foods ?Protein bars. ?The items listed above may not be a complete list of foods and beverages you can eat and drink. Contact a dietitian for more information. ?What high-calorie foods should I  eat? ?Fruits ?Dried fruit. Fruit leather. Canned fruit in syrup. Fruit juice. Avocado. ?Vegetables ?Vegetables cooked in oil or butter. Fried potatoes. ?Grains ?Pasta. Quick breads. Muffins. Pancakes. Ready-to-eat cereal. ?Meats and other proteins ?Peanut butter. Nuts and seeds. ?Dairy ?Heavy cream. Whipped cream. Cream cheese. Sour cream. Ice cream. Custard. Pudding. Whole milk dairy products. ?Beverages ?Meal-replacement beverages. Nutrition shakes. Fruit juice. ?Seasonings and condiments ?Salad dressing. Mayonnaise. Alfredo sauce. Fruit preserves or jelly. Honey. Syrup. ?Sweets and desserts ?Cake. Cookies. Pie. Pastries. Candy bars. Chocolate. ?Fats and oils ?Butter or margarine. Oil. Gravy. ?Other foods ?Meal-replacement bars. ?The items listed above may not be a complete list of foods and beverages you can eat and drink. Contact a dietitian for more information. ?Summary ?A high-protein, high-calorie diet can help you gain weight or heal faster after an injury, illness, or surgery. ?To increase your protein and calories, add ingredients such as whole milk, peanut butter, cheese, beans, meat, or seafood to meal items. ?To get enough extra calories each day, include high-calorie foods and beverages at each meal. ?Adding a high-calorie drink or shake can be an easy way to help you get enough calories each day. Talk with your healthcare provider or dietitian about the best options for you. ?This information is not intended to replace advice given to you by your health care provider. Make sure you discuss any questions you have with your health care provider. ?Document Revised: 05/23/2020 Document Reviewed: 05/23/2020 ?Elsevier Patient Education ? [place]. ? ?

## 2021-10-31 NOTE — Telephone Encounter (Signed)
Copied from [place] [phone]. Topic: General - Other >> Oct 31, 2021 10:53 AM Yvette Rack wrote: Reason for CRM: Crystal with [employer] called for update on order# [id number] faxed on 10/21/21 and re-faxed on 10/26/21. Crystal requests that the order form be reviewed, signed, dated, and returned. Cb# [phone]

## 2021-10-31 NOTE — Progress Notes (Signed)
? ?Established Patient Office Visit ? ?Subjective:  ?Patient ID: Angela Horton, female    DOB: 04/07/1941  Age: 81 y.o. MRN: [id number] ? ?CC:  ?Chief Complaint  ?Patient presents with  ? Follow-up  ? ? ?HPI ?Angela Horton presents for follow up. ? ?Hypotension/Dizziness: ?-Currently off all blood pressure medications ?-Symptoms of dizziness, lightheadedness has resolved.  Blood pressure good today. ? ?Weight Loss: ?-Total weight loss of 38 pounds since December, weight at last visit 115, today 106 pounds ?-On PPI, Ensure  ?-CT A/P 08/25/21 showing wall thickening of the distal esophagus, suspicious for esophagitis or reflux.  Mild prepyloric gastric wall thickening versus peristalsis.  Mild central intrahepatic biliary ductal dilation without common bile duct dilation without evidence of obstructing mass.  Mild hepatic steatosis.  Left ureterocele.  ?-Now following with GI, ERCP performed on 10/27/2021, which showed mildly dilated main bile duct with biliary sphincterotomy performed. Biopsies taken, negative for malignancy. She has a follow up with GI on 12/21/21. ?-She states her appetite is good, she eats oatmeal and waffles in the morning.  Her husband states she only eats about 25% of each meal and tries to supplement Ensure on top of that.  She denies abdominal pain, nausea, vomiting.  She has a bowel movement every day.  She denies any urinary symptoms.  She denies fevers or chills.  ? ?Past Medical History:  ?Diagnosis Date  ? Diabetes mellitus without complication ([place])   ? Gout   ? Hyperlipidemia   ? Hypertension   ? Obesity   ? ? ?Past Surgical History:  ?Procedure Laterality Date  ? ABDOMINAL HYSTERECTOMY    ? COLONOSCOPY WITH PROPOFOL N/A 09/11/2021  ? Procedure: COLONOSCOPY WITH PROPOFOL;  Surgeon: Lin Landsman, MD;  Location: [facility] ENDOSCOPY;  Service: Gastroenterology;  Laterality: N/A;  ? ERCP N/A 10/27/2021  ? Procedure: ENDOSCOPIC RETROGRADE CHOLANGIOPANCREATOGRAPHY (ERCP);  Surgeon: Lucilla Lame, MD;  Location: [facility] ENDOSCOPY;  Service: Endoscopy;  Laterality: N/A;  ? ESOPHAGOGASTRODUODENOSCOPY (EGD) WITH PROPOFOL N/A 09/11/2021  ? Procedure: ESOPHAGOGASTRODUODENOSCOPY (EGD) WITH PROPOFOL;  Surgeon: Lin Landsman, MD;  Location: [facility] ENDOSCOPY;  Service: Gastroenterology;  Laterality: N/A;  ? EUS N/A 09/29/2021  ? Procedure: UPPER ENDOSCOPIC ULTRASOUND (EUS) LINEAR;  Surgeon: Holly Bodily, MD;  Location: [facility] ENDOSCOPY;  Service: Gastroenterology;  Laterality: N/A;  ? ? ?Family History  ?Problem Relation Age of Onset  ? Kidney disease Mother   ? Kidney disease Sister   ? Stroke Brother   ? Heart disease Sister   ? Stroke Sister   ? ? ?Social History  ? ?Socioeconomic History  ? Marital status: Married  ?  Spouse name: Not on file  ? Number of children: 4  ? Years of education: Not on file  ? Highest education level: Not on file  ?Occupational History  ? Not on file  ?Tobacco Use  ? Smoking status: Never  ? Smokeless tobacco: Never  ? Tobacco comments:  ?  Smoking cessation materials not required  ?Vaping Use  ? Vaping Use: Never used  ?Substance and Sexual Activity  ? Alcohol use: No  ?  Alcohol/week: 0.0 standard drinks  ? Drug use: No  ? Sexual activity: Yes  ?Other Topics Concern  ? Not on file  ?Social History Narrative  ? Not on file  ? ?Social Determinants of Health  ? ?Financial Resource Strain: Low Risk   ? Difficulty of Paying Living Expenses: Not hard at all  ?Food Insecurity: No Food Insecurity  ?  Worried About Programme researcher, broadcasting/film/video in the Last Year: Never true  ? Ran Out of Food in the Last Year: Never true  ?Transportation Needs: No Transportation Needs  ? Lack of Transportation (Medical): No  ? Lack of Transportation (Non-Medical): No  ?Physical Activity: Inactive  ? Days of Exercise per Week: 0 days  ? Minutes of Exercise per Session: 0 min  ?Stress: No Stress Concern Present  ? Feeling of Stress : Not at all  ?Social Connections: Moderately Integrated  ? Frequency of  Communication with Friends and Family: More than three times a week  ? Frequency of Social Gatherings with Friends and Family: More than three times a week  ? Attends Religious Services: More than 4 times per year  ? Active Member of Clubs or Organizations: No  ? Attends Banker Meetings: Never  ? Marital Status: Married  ?Intimate Partner Violence: Not At Risk  ? Fear of Current or Ex-Partner: No  ? Emotionally Abused: No  ? Physically Abused: No  ? Sexually Abused: No  ? ? ?Outpatient Medications Prior to Visit  ?Medication Sig Dispense Refill  ? brimonidine (ALPHAGAN) 0.2 % ophthalmic solution Place 1 drop into both eyes 2 (two) times daily.  3  ? feeding supplement (ENSURE ENLIVE / ENSURE PLUS) LIQD Take 237 mLs by mouth 2 (two) times daily between meals. 237 mL 12  ? ferrous fumarate-b12-vitamic C-folic acid (TRINSICON / FOLTRIN) capsule Take 1 capsule by mouth 2 (two) times daily after a meal. 60 capsule 2  ? Multiple Vitamins-Minerals (ONE-A-DAY WOMENS PETITES) TABS Take 1 tablet by mouth in the morning and at bedtime.    ? pantoprazole (PROTONIX) 40 MG tablet Take 1 tablet (40 mg total) by mouth 2 (two) times daily before a meal. 60 tablet 2  ? ?No facility-administered medications prior to visit.  ? ? ?Allergies  ?Allergen Reactions  ? Metformin And Related   ?  Upset stomach headaches  ? ? ?ROS ?Review of Systems  ?Constitutional:  Positive for unexpected weight change. Negative for chills and fever.  ?Eyes:  Negative for visual disturbance.  ?Respiratory:  Negative for cough and shortness of breath.   ?Cardiovascular:  Negative for chest pain.  ?Gastrointestinal:  Negative for abdominal pain, blood in stool, constipation, diarrhea, nausea and vomiting.  ?Genitourinary:  Negative for dysuria and hematuria.  ?Neurological:  Positive for weakness. Negative for dizziness and light-headedness.  ? ?  ?Objective:  ?  ?Physical Exam ?Constitutional:   ?   Appearance: Normal appearance.  ?   Comments:  Frail appearing, walks with walker  ?HENT:  ?   Head: Normocephalic and atraumatic.  ?Eyes:  ?   Conjunctiva/sclera: Conjunctivae normal.  ?Cardiovascular:  ?   Rate and Rhythm: Normal rate and regular rhythm.  ?Pulmonary:  ?   Effort: Pulmonary effort is normal.  ?   Breath sounds: Normal breath sounds.  ?Musculoskeletal:  ?   Right lower leg: No edema.  ?   Left lower leg: No edema.  ?Skin: ?   General: Skin is warm and dry.  ?Neurological:  ?   General: No focal deficit present.  ?   Mental Status: She is alert. Mental status is at baseline.  ?Psychiatric:     ?   Mood and Affect: Mood normal.     ?   Behavior: Behavior normal.  ? ? ?BP 110/70   Pulse 75   Temp (!) 97.5 ?F (36.4 ?C)   Resp 16  Ht '5\' 8"'$  (1.727 m)   Wt 106 lb (48.1 kg)   SpO2 98%   BMI 16.12 kg/m?  ?Wt Readings from Last 3 Encounters:  ?10/31/21 106 lb (48.1 kg)  ?10/27/21 122 lb (55.3 kg)  ?09/29/21 122 lb (55.3 kg)  ? ? ? ?Health Maintenance Due  ?Topic Date Due  ? Zoster Vaccines- Shingrix (1 of 2) Never done  ? OPHTHALMOLOGY EXAM  10/23/2018  ? COVID-19 Vaccine (4 - Booster for Pfizer series) 08/18/2020  ? FOOT EXAM  01/21/2021  ? URINE MICROALBUMIN  01/21/2021  ? ? ?There are no preventive care reminders to display for this patient. ? ?Lab Results  ?Component Value Date  ? TSH 2.900 09/09/2021  ? ?Lab Results  ?Component Value Date  ? WBC 5.1 09/08/2021  ? HGB 10.2 (L) 09/08/2021  ? HCT 31.2 (L) 09/08/2021  ? MCV 87.4 09/08/2021  ? PLT 278 09/08/2021  ? ?Lab Results  ?Component Value Date  ? NA 136 10/27/2021  ? K 3.2 (L) 10/27/2021  ? CO2 27 10/27/2021  ? GLUCOSE 114 (H) 10/27/2021  ? BUN 20 10/27/2021  ? CREATININE 1.00 10/27/2021  ? BILITOT 0.8 09/08/2021  ? ALKPHOS 31 (L) 09/08/2021  ? AST 56 (H) 09/08/2021  ? ALT 21 09/08/2021  ? PROT 6.7 09/08/2021  ? ALBUMIN 3.1 (L) 09/08/2021  ? CALCIUM 9.4 10/27/2021  ? ANIONGAP 11 10/27/2021  ? EGFR 38 (L) 06/07/2021  ? ?Lab Results  ?Component Value Date  ? CHOL 86 01/25/2021  ? ?Lab Results   ?Component Value Date  ? HDL 26 (L) 01/25/2021  ? ?Lab Results  ?Component Value Date  ? LDLCALC 45 01/25/2021  ? ?Lab Results  ?Component Value Date  ? TRIG 143 09/08/2021  ? ?Lab Results  ?Component Value Date

## 2021-11-23 ENCOUNTER — Telehealth: Payer: Self-pay | Admitting: Gastroenterology

## 2021-11-23 NOTE — Telephone Encounter (Signed)
Patients son called and left vm stating he was returning a call from [place]. Requesting a call back.

## 2021-12-21 ENCOUNTER — Other Ambulatory Visit: Payer: Self-pay

## 2021-12-21 ENCOUNTER — Encounter: Payer: Self-pay | Admitting: Gastroenterology

## 2021-12-21 ENCOUNTER — Ambulatory Visit: Payer: Medicare HMO | Admitting: Gastroenterology

## 2021-12-21 VITALS — BP 128/83 | HR 84 | Temp 98.3°F | Ht 68.0 in | Wt 102.5 lb

## 2021-12-21 DIAGNOSIS — D135 Benign neoplasm of extrahepatic bile ducts: Secondary | ICD-10-CM | POA: Diagnosis not present

## 2021-12-21 NOTE — Progress Notes (Signed)
[name], MD [address]  [place]  [place], [place] [address]  Main: [phone]  Fax: [phone]    Gastroenterology Consultation  Referring Provider:     Delsa Grana, PA-C Primary Care Physician:  Delsa Grana, PA-C Primary Gastroenterologist:  Dr. [name] Reason for Consultation: Ampullary adenoma        HPI:   Angela Horton is a 81 y.o. female referred by Dr. Delsa Grana, PA-C  for consultation & management of ampullary adenoma.  Patient is seeing me as a hospital follow-up.  She was admitted to [facility] in early March secondary to generalized weakness, dizziness and loss of appetite.  She lost about 40 pounds.  She was found to have abnormal CT which showed esophagitis, thickening of the distal esophagus as well as intrahepatic biliary dilatation.  She underwent MRCP which also revealed mild intra and extrahepatic biliary dilation.  Possible ampullary lesion in pancreatic head.  She underwent EGD as inpatient which showed mild Schatzki's ring that was dilated.  Gastric biopsies were negative for H. pylori infection.  Colonoscopy was unremarkable.  She underwent EUS as outpatient which revealed choledocholithiasis only.  Subsequently, patient underwent ERCP on 10/27/2021, underwent biliary sphincterotomy and sludge was removed.  She had prominent major papilla, biopsies revealed tubulovillous adenoma.  Patient was contacted about EGD with side-viewing scope for more biopsies of the ampulla.  However, patient refused to undergo any procedures at this time.  Today, patient is accompanied by her daughter and her husband.  Patient is in wheelchair, dependent on ADLs.  She has poor appetite, on mirtazapine 7.5 mg at bedtime with not much improvement in her appetite.  She tries to eat soft diet.  She does not have any abdominal pain, nausea or vomiting.  Patient denies any GI symptoms. Patient continues to take Protonix 40 mg p.o. twice daily  NSAIDs:  None  Antiplts/Anticoagulants/Anti thrombotics: None  GI Procedures:  ERCP 10/27/2021 by Dr. Lucilla Lame - The major papilla appeared to be bulging. - The entire main bile duct was moderately dilated. - A biliary sphincterotomy was performed. - The biliary tree was swept and sludge was found. - Biopsy was performed The ampulla. DIAGNOSIS:  A. DUODENUM, AMPULLA; COLD BIOPSY:  - TUBULOVILLOUS ADENOMA.  - NEGATIVE FOR HIGH-GRADE DYSPLASIA AND MALIGNANCY.   EUS Impressions: 09/29/2021 by Dr. Francella Solian - One stone was visualized endosonographically in the common bile duct. The bile duct was dilated, likely secondary to stone disease. No obstructing ampullary lesion seen. - Hyperechoic material consistent with sludge was visualized endosonographically in the gallbladder. - There was no sign of significant pathology in the pancreatic head, genu of the pancreas, pancreatic body and pancreatic tail. - Normal visualized portions of the liver. - Normal celiac region. - No specimens collected.  EGD and colonoscopy 09/11/2021 - Normal duodenal bulb and second portion of the duodenum. - Esophagogastric landmarks identified. - Low-grade of narrowing Schatzki ring. Dilated to 12 mm. - Medium-sized hiatal hernia. - Normal esophagus. - Gastric mucosal atrophy. Biopsied. DIAGNOSIS:  A. STOMACH, RANDOM; COLD BIOPSY:  - GASTRIC ANTRAL MUCOSA WITH MILD CHRONIC INACTIVE GASTRITIS AND  REACTIVE FOVEOLAR HYPERPLASIA.  - GASTRIC OXYNTIC MUCOSA WITH NO SIGNIFICANT HISTOPATHOLOGIC CHANGE.  - NEGATIVE FOR H. PYLORI, INTESTINAL METAPLASIA, DYSPLASIA, AND  MALIGNANCY.   - Preparation of the colon was fair. - The entire examined colon is normal. - The distal rectum and anal verge are normal on retroflexion view. - No specimens collected.  Past Medical  History:  Diagnosis Date   Diabetes mellitus without complication ([place])    Gout    Hyperlipidemia    Hypertension    Obesity     Past Surgical  History:  Procedure Laterality Date   ABDOMINAL HYSTERECTOMY     COLONOSCOPY WITH PROPOFOL N/A 09/11/2021   Procedure: COLONOSCOPY WITH PROPOFOL;  Surgeon: Lin Landsman, MD;  Location: [facility] ENDOSCOPY;  Service: Gastroenterology;  Laterality: N/A;   ERCP N/A 10/27/2021   Procedure: ENDOSCOPIC RETROGRADE CHOLANGIOPANCREATOGRAPHY (ERCP);  Surgeon: Lucilla Lame, MD;  Location: [facility] ENDOSCOPY;  Service: Endoscopy;  Laterality: N/A;   ESOPHAGOGASTRODUODENOSCOPY (EGD) WITH PROPOFOL N/A 09/11/2021   Procedure: ESOPHAGOGASTRODUODENOSCOPY (EGD) WITH PROPOFOL;  Surgeon: Lin Landsman, MD;  Location: [facility] ENDOSCOPY;  Service: Gastroenterology;  Laterality: N/A;   EUS N/A 09/29/2021   Procedure: UPPER ENDOSCOPIC ULTRASOUND (EUS) LINEAR;  Surgeon: Holly Bodily, MD;  Location: [facility] ENDOSCOPY;  Service: Gastroenterology;  Laterality: N/A;     Current Outpatient Medications:    feeding supplement (ENSURE ENLIVE / ENSURE PLUS) LIQD, Take 237 mLs by mouth 2 (two) times daily between meals., Disp: 237 mL, Rfl: 12   ferrous [id number] C-folic acid (TRINSICON / FOLTRIN) capsule, Take 1 capsule by mouth 2 (two) times daily after a meal., Disp: 60 capsule, Rfl: 2   mirtazapine (REMERON) 7.5 MG tablet, Take 1 tablet (7.5 mg total) by mouth at bedtime., Disp: 90 tablet, Rfl: 0   Multiple Vitamins-Minerals (ONE-A-DAY WOMENS PETITES) TABS, Take 1 tablet by mouth in the morning and at bedtime., Disp: , Rfl:    pantoprazole (PROTONIX) 40 MG tablet, Take 1 tablet (40 mg total) by mouth 2 (two) times daily before a meal., Disp: 60 tablet, Rfl: 2   brimonidine (ALPHAGAN) 0.2 % ophthalmic solution, Place 1 drop into both eyes 2 (two) times daily. (Patient not taking: Reported on 12/21/2021), Disp: , Rfl: 3   Family History  Problem Relation Age of Onset   Kidney disease Mother    Kidney disease Sister    Stroke Brother    Heart disease Sister    Stroke Sister      Social History   Tobacco  Use   Smoking status: Never   Smokeless tobacco: Never   Tobacco comments:    Smoking cessation materials not required  Vaping Use   Vaping Use: Never used  Substance Use Topics   Alcohol use: No    Alcohol/week: 0.0 standard drinks of alcohol   Drug use: No    Allergies as of 12/21/2021 - Review Complete 12/21/2021  Allergen Reaction Noted   Metformin and related  11/30/2017    Review of Systems:    All systems reviewed and negative except where noted in HPI.   Physical Exam:  BP 128/83 (BP Location: Left Arm, Patient Position: Sitting, Cuff Size: Normal)   Pulse 84   Temp 98.3 F (36.8 C) (Oral)   Ht '5\' 8"'$  (1.727 m)   Wt 102 lb 8 oz (46.5 kg)   BMI 15.59 kg/m  No LMP recorded. Patient has had a hysterectomy.  General:   Alert, thin built, poorly nourished, pleasant and cooperative in NAD Head:  Normocephalic and atraumatic. Eyes:  Sclera clear, no icterus.   Conjunctiva pink. Ears:  Normal auditory acuity. Nose:  No deformity, discharge, or lesions. Mouth:  No deformity or lesions,oropharynx pink & moist. Neck:  Supple; no masses or thyromegaly. Lungs:  Respirations even and unlabored.  Clear throughout to auscultation.   No wheezes, crackles, or rhonchi.  No acute distress. Heart:  Regular rate and rhythm; no murmurs, clicks, rubs, or gallops. Abdomen:  Normal bowel sounds. Soft, non-tender and non-distended without masses, hepatosplenomegaly or hernias noted.  No guarding or rebound tenderness.   Rectal: Not performed Msk:  Symmetrical without gross deformities. Good, equal movement & strength bilaterally. Pulses:  Normal pulses noted. Extremities:  No clubbing or edema.  No cyanosis. Neurologic:  Alert and oriented x3;  grossly normal neurologically. Skin:  Intact without significant lesions or rashes. No jaundice. Psych:  Alert and cooperative. Normal mood and affect.  Imaging Studies: Reviewed  Assessment and Plan:   Angela Horton is a 81 y.o. female  with history of diabetes, hypertension, hyperlipidemia, Schatzki's ring s/p dilation, choledocholithiasis s/p ERCP in 10/2021 with biliary sphincterotomy and extraction of sludge, tubulovillous adenoma of the ampulla on biopsies  Tubulovillous adenoma of the ampulla Today, I have again discussed both with patient and her family regarding importance of EUS with side-viewing scope for second look of the ampulla and more biopsies, to rule out any underlying malignancy.  However, patient is persistent about not undergoing any procedures at this time. Patient's daughter informed me that they will discuss about the procedure again at home and will reach out to [place] if patient changes her mind  Advised patient to increase mirtazapine to treat diabetes daily at bedtime and reach out to her PCP  Follow up as needed   [name], MD

## 2021-12-23 ENCOUNTER — Encounter: Payer: Self-pay | Admitting: Internal Medicine

## 2021-12-26 ENCOUNTER — Ambulatory Visit (INDEPENDENT_AMBULATORY_CARE_PROVIDER_SITE_OTHER): Payer: Medicare HMO | Admitting: Internal Medicine

## 2021-12-26 ENCOUNTER — Encounter: Payer: Self-pay | Admitting: Internal Medicine

## 2021-12-26 VITALS — BP 108/62 | HR 80 | Temp 97.6°F | Resp 16 | Ht 68.0 in | Wt 103.8 lb

## 2021-12-26 DIAGNOSIS — R634 Abnormal weight loss: Secondary | ICD-10-CM

## 2021-12-26 DIAGNOSIS — R630 Anorexia: Secondary | ICD-10-CM | POA: Diagnosis not present

## 2021-12-26 DIAGNOSIS — E46 Unspecified protein-calorie malnutrition: Secondary | ICD-10-CM | POA: Diagnosis not present

## 2021-12-26 DIAGNOSIS — E1169 Type 2 diabetes mellitus with other specified complication: Secondary | ICD-10-CM | POA: Diagnosis not present

## 2021-12-26 LAB — POCT GLYCOSYLATED HEMOGLOBIN (HGB A1C): Hemoglobin A1C: 6.2 % — AB (ref 4.0–5.6)

## 2021-12-26 MED ORDER — MIRTAZAPINE 15 MG PO TABS: 15.0000 mg | ORAL_TABLET | Freq: Every day | ORAL | 0 refills | Status: DC

## 2022-01-12 ENCOUNTER — Ambulatory Visit: Payer: Medicare HMO

## 2022-01-16 ENCOUNTER — Encounter: Payer: Self-pay | Admitting: Internal Medicine

## 2022-01-16 ENCOUNTER — Ambulatory Visit (INDEPENDENT_AMBULATORY_CARE_PROVIDER_SITE_OTHER): Payer: Medicare HMO | Admitting: Internal Medicine

## 2022-01-16 VITALS — BP 136/72 | HR 100 | Temp 97.6°F | Resp 16 | Ht 68.0 in | Wt 111.5 lb

## 2022-01-16 DIAGNOSIS — Z0000 Encounter for general adult medical examination without abnormal findings: Secondary | ICD-10-CM

## 2022-01-16 NOTE — Progress Notes (Signed)
Subjective:   Angela Horton is a 81 y.o. female who presents for Medicare Annual (Subsequent) preventive examination.  Review of Systems:   Cardiac Risk Factors include: advanced age (>52mn, >>55women);diabetes mellitus;dyslipidemia;hypertension    Objective:     Vitals: BP 136/72   Pulse 100   Temp 97.6 F (36.4 C)   Resp 16   Ht '5\' 8"'$  (1.727 m)   Wt 111 lb 8 oz (50.6 kg)   SpO2 93%   BMI 16.95 kg/m   Body mass index is 16.95 kg/m.     10/27/2021   10:38 AM 09/29/2021   12:02 PM 09/11/2021    9:56 AM 09/08/2021    6:24 PM 09/08/2021   10:42 AM 07/01/2021    9:58 AM 01/11/2021    3:06 PM  Advanced Directives  Does Patient Have a Medical Advance Directive? Yes Yes No  No Yes Yes  Type of ACorporate treasurerof [place] will    [place] will [place] will  Does patient want to make changes to medical advance directive?      No - Patient declined   Copy of [place] Chart?  No - copy requested    No - copy requested No - copy requested  Would patient like information on creating a medical advance directive?    No - Patient declined       Tobacco Social History   Tobacco Use  Smoking Status Never  Smokeless Tobacco Never  Tobacco Comments   Smoking cessation materials not required     Counseling given: Not Answered Tobacco comments: Smoking cessation materials not required   Clinical Intake:  Pain : No/denies pain     BMI - recorded: 16.95 Nutritional Status: poor Nutritional Risks: decrease nutritional intake  Diabetes: Yes CBG done?: No Did pt. bring in CBG monitor from home?: No   How often do you need to have someone help you when you read instructions, pamphlets, or other written materials from your doctor or pharmacy?: 1 - Never  Nutrition Risk Assessment:  Has the patient had any N/V/D within the last 2 months?  No  Does the patient have any  non-healing wounds?  No  Has the patient had any unintentional weight loss or weight gain?  Yes   Diabetes:  Is the patient diabetic?  Yes  If diabetic, was a CBG obtained today?  No  Did the patient bring in their glucometer from home?  No  How often do you monitor your CBG's? Twice a week - last A1c 6.2 6/23.   Financial Strains and Diabetes Management:  Are you having any financial strains with the device, your supplies or your medication? No .  Does the patient want to be seen by Chronic Care Management for management of their diabetes?  No  Would the patient like to be referred to a Nutritionist or for Diabetic Management?  No   Diabetic Exams:  Diabetic Eye Exam: Completed 10/22/2021..Marland KitchenPt has been advised about the importance in completing this exam.   Diabetic Foot Exam: Completed 12/23/21. Pt has been advised about the importance in completing this exam.   Past Medical History:  Diagnosis Date   Diabetes mellitus without complication ([place]    Gout    Hyperlipidemia    Hypertension    Obesity    Past Surgical History:  Procedure Laterality Date   ABDOMINAL HYSTERECTOMY     COLONOSCOPY WITH  PROPOFOL N/A 09/11/2021   Procedure: COLONOSCOPY WITH PROPOFOL;  Surgeon: Lin Landsman, MD;  Location: [facility] ENDOSCOPY;  Service: Gastroenterology;  Laterality: N/A;   ERCP N/A 10/27/2021   Procedure: ENDOSCOPIC RETROGRADE CHOLANGIOPANCREATOGRAPHY (ERCP);  Surgeon: Lucilla Lame, MD;  Location: [facility] ENDOSCOPY;  Service: Endoscopy;  Laterality: N/A;   ESOPHAGOGASTRODUODENOSCOPY (EGD) WITH PROPOFOL N/A 09/11/2021   Procedure: ESOPHAGOGASTRODUODENOSCOPY (EGD) WITH PROPOFOL;  Surgeon: Lin Landsman, MD;  Location: [facility] ENDOSCOPY;  Service: Gastroenterology;  Laterality: N/A;   EUS N/A 09/29/2021   Procedure: UPPER ENDOSCOPIC ULTRASOUND (EUS) LINEAR;  Surgeon: Holly Bodily, MD;  Location: [facility] ENDOSCOPY;  Service: Gastroenterology;  Laterality: N/A;   Family History   Problem Relation Age of Onset   Kidney disease Mother    Kidney disease Sister    Stroke Brother    Heart disease Sister    Stroke Sister    Social History   Socioeconomic History   Marital status: Married    Spouse name: Not on file   Number of children: 4   Years of education: Not on file   Highest education level: Not on file  Occupational History   Not on file  Tobacco Use   Smoking status: Never   Smokeless tobacco: Never   Tobacco comments:    Smoking cessation materials not required  Vaping Use   Vaping Use: Never used  Substance and Sexual Activity   Alcohol use: No    Alcohol/week: 0.0 standard drinks of alcohol   Drug use: No   Sexual activity: Yes  Other Topics Concern   Not on file  Social History Narrative   Not on file   Social Determinants of Health   Financial Resource Strain: Low Risk  (01/11/2021)   Overall Financial Resource Strain (CARDIA)    Difficulty of Paying Living Expenses: Not hard at all  Food Insecurity: No Food Insecurity (01/11/2021)   Hunger Vital Sign    Worried About Running Out of Food in the Last Year: Never true    Ran Out of Food in the Last Year: Never true  Transportation Needs: No Transportation Needs (01/11/2021)   PRAPARE - Hydrologist (Medical): No    Lack of Transportation (Non-Medical): No  Physical Activity: Inactive (01/11/2021)   Exercise Vital Sign    Days of Exercise per Week: 0 days    Minutes of Exercise per Session: 0 min  Stress: No Stress Concern Present (01/11/2021)   [place]    Feeling of Stress : Not at all  Social Connections: Moderately Integrated (01/11/2021)   Social Connection and Isolation Panel [NHANES]    Frequency of Communication with Friends and Family: More than three times a week    Frequency of Social Gatherings with Friends and Family: More than three times a week    Attends Religious Services:  More than 4 times per year    Active Member of [employer] or Organizations: No    Attends Archivist Meetings: Never    Marital Status: Married    Outpatient Encounter Medications as of 01/16/2022  Medication Sig   brimonidine (ALPHAGAN) 0.2 % ophthalmic solution Place 1 drop into both eyes 2 (two) times daily.   feeding supplement (ENSURE ENLIVE / ENSURE PLUS) LIQD Take 237 mLs by mouth 2 (two) times daily between meals.   ferrous [id number] C-folic acid (TRINSICON / FOLTRIN) capsule Take 1 capsule by mouth 2 (two) times daily after a  meal.   mirtazapine (REMERON) 15 MG tablet Take 1 tablet (15 mg total) by mouth at bedtime.   Multiple Vitamins-Minerals (ONE-A-DAY WOMENS PETITES) TABS Take 1 tablet by mouth in the morning and at bedtime.   pantoprazole (PROTONIX) 40 MG tablet Take 1 tablet (40 mg total) by mouth 2 (two) times daily before a meal.   No facility-administered encounter medications on file as of 01/16/2022.    Activities of Daily Living    01/16/2022    2:17 PM 12/26/2021    2:42 PM  In your present state of health, do you have any difficulty performing the following activities:  Hearing? 0 0  Vision? 1 1  Difficulty concentrating or making decisions? 1 1  Walking or climbing stairs? 1 1  Dressing or bathing? 0 0  Doing errands, shopping? 1 1    Patient Care Team: Teodora Medici, DO as PCP - General (Internal Medicine)    Assessment:   This is a routine wellness examination for [facility].  Exercise Activities and Dietary recommendations     Goals Addressed   None     Fall Risk:    01/16/2022    2:16 PM 12/26/2021    2:42 PM 10/31/2021    3:35 PM 09/20/2021    2:23 PM 08/11/2021   11:42 AM  Fall Risk   Falls in the past year? 1 1 0 1 1  Number falls in past yr: 1 1 0 1 1  Injury with Fall? 1 0 0 1 0  Risk for fall due to : Impaired balance/gait;Impaired mobility Impaired balance/gait;Impaired mobility Impaired balance/gait;Impaired mobility  Impaired balance/gait;Impaired mobility Impaired mobility;Impaired balance/gait    FALL RISK PREVENTION PERTAINING TO THE HOME:  Any stairs in or around the home? No   Home free of loose throw rugs in walkways, pet beds, electrical cords, etc? No  Adequate lighting in your home to reduce risk of falls? No   ASSISTIVE DEVICES UTILIZED TO PREVENT FALLS:  Life alert? No  Use of a cane, walker or w/c? Yes  Grab bars in the bathroom? Yes  Shower chair or bench in shower? No  Elevated toilet seat or a handicapped toilet? No   TIMED UP AND GO:  Was the test performed? Yes .  Length of time to ambulate 10 feet: 18 sec.   GAIT:  Gait steady, using cane.   Appearance of gait: Gait slow and steady with the use of an assistive device.  Education: Fall risk prevention has been discussed.  Intervention(s) required? No   DME/home health order needed?  No    Depression Screen    01/16/2022    2:17 PM 12/26/2021    2:42 PM 10/31/2021    3:36 PM 09/20/2021    2:23 PM  PHQ 2/9 Scores  PHQ - 2 Score 0 0 0 0  PHQ- 9 Score 0 0 0 0     Cognitive Function    07/13/2021   10:21 AM  MMSE - Mini Mental State Exam  Orientation to time 5  Orientation to Place 4  Registration 3  Attention/ Calculation 2  Recall 0  Language- name 2 objects 2  Language- repeat 1  Language- follow 3 step command 3  Language- read & follow direction 1  Write a sentence 1  Copy design 0  Total score 22        Immunization History  Administered Date(s) Administered   Fluad Quad(high Dose 65+) 06/07/2021   PFIZER(Purple Top)SARS-COV-2 Vaccination 10/01/2019, 10/22/2019,  06/23/2020   Pneumococcal Conjugate-13 04/29/2014   Pneumococcal Polysaccharide-23 06/13/2010   Td 06/13/2010    Qualifies for Shingles Vaccine? Yes  Due for Shingrix. Education has been provided regarding the importance of this vaccine. Pt has been advised to call insurance company to determine out of pocket expense. Advised may also  receive vaccine at local pharmacy or Health Dept. Verbalized acceptance and understanding.  Tdap: Although this vaccine is not a covered service during a Wellness Exam, does the patient still wish to receive this vaccine today?  No .  Education has been provided regarding the importance of this vaccine. Advised may receive this vaccine at local pharmacy or Health Dept. Aware to provide a copy of the vaccination record if obtained from local pharmacy or Health Dept. Verbalized acceptance and understanding.  Flu Vaccine: Due for Flu vaccine. Does the patient want to receive this vaccine today?  Yes . Education has been provided regarding the importance of this vaccine but still declined. Advised may receive this vaccine at local pharmacy or Health Dept. Aware to provide a copy of the vaccination record if obtained from local pharmacy or Health Dept. Verbalized acceptance and understanding.  Pneumococcal Vaccine: Due for Pneumococcal vaccine. Does the patient want to receive this vaccine today?  No . Education has been provided regarding the importance of this vaccine but still declined. Advised may receive this vaccine at local pharmacy or Health Dept. Aware to provide a copy of the vaccination record if obtained from local pharmacy or Health Dept. Verbalized acceptance and understanding.   Covid-19 Vaccine: 3 vaccines completed.   Screening Tests Health Maintenance  Topic Date Due   Zoster Vaccines- Shingrix (1 of 2) Never done   OPHTHALMOLOGY EXAM  10/23/2018   COVID-19 Vaccine (4 - Pfizer series) 08/18/2020   URINE MICROALBUMIN  01/21/2021   INFLUENZA VACCINE  01/31/2022   HEMOGLOBIN A1C  06/27/2022   FOOT EXAM  12/27/2022   Pneumonia Vaccine 78+ Years old  Completed   DEXA SCAN  Completed   HPV VACCINES  Aged Out   TETANUS/TDAP  Discontinued    Cancer Screenings:  Colorectal Screening: Completed 09/11/21. No longer required.   Mammogram: No longer required.   Bone Density: Completed  12/16/12. Results reflect NORMAL  Lung Cancer Screening: (Low Dose CT Chest recommended if Age 64-80 years, 30 pack-year currently smoking OR have quit w/in 15years.) does not qualify.   Additional Screening:  Hepatitis C Screening: does not qualify  Dental Screening: Recommended annual dental exams for proper oral hygiene   Community Resource Referral:  CRR required this visit?  No     Plan:  I have personally reviewed and addressed the Medicare Annual Wellness questionnaire and have noted the following in the patient's chart:  A. Medical and social history B. Use of alcohol, tobacco or illicit drugs  C. Current medications and supplements D. Functional ability and status E.  Nutritional status F.  Physical activity G. Advance directives H. List of other physicians I.  Hospitalizations, surgeries, and ER visits in previous 12 months J.  Pax such as hearing and vision if needed, cognitive and depression L. Referrals and appointments   In addition, I have reviewed and discussed with patient certain preventive protocols, quality metrics, and best practice recommendations. A written personalized care plan for preventive services as well as general preventive health recommendations were provided to patient.   Signed,    Teodora Medici, DO  01/16/2022

## 2022-01-23 ENCOUNTER — Other Ambulatory Visit: Payer: Self-pay | Admitting: Family Medicine

## 2022-01-23 ENCOUNTER — Other Ambulatory Visit: Payer: Self-pay | Admitting: Internal Medicine

## 2022-01-23 DIAGNOSIS — R634 Abnormal weight loss: Secondary | ICD-10-CM

## 2022-01-23 DIAGNOSIS — E46 Unspecified protein-calorie malnutrition: Secondary | ICD-10-CM

## 2022-01-23 DIAGNOSIS — R630 Anorexia: Secondary | ICD-10-CM

## 2022-01-23 NOTE — Telephone Encounter (Signed)
Pt stated she contacted her pharmacy but she was told to contact provider  Medication Refill - Medication: mirtazapine (REMERON) 15 MG tablet  Has the patient contacted their pharmacy? Yes.    Preferred Pharmacy (with phone number or street name):  [id number] [place] [place] [address] Phone:  [phone] Fax:  [phone]    Has the patient been seen for an appointment in the last year OR does the patient have an upcoming appointment? Yes.    Agent: Please be advised that RX refills may take up to 3 business days. We ask that you follow-up with your pharmacy.

## 2022-01-24 NOTE — Telephone Encounter (Signed)
[facility] and patient picked up rx 12/26/21 for #90.

## 2022-01-24 NOTE — Telephone Encounter (Signed)
Called patient to review medication request. Should have enough medication until September. Last refill 12/26/21 #90 . No answer, LVMTCB [phone].

## 2022-01-24 NOTE — Telephone Encounter (Signed)
Requested too soon last dispensed 12/26/21 #90 .  Requested Prescriptions  Refused Prescriptions Disp Refills  . mirtazapine (REMERON) 15 MG tablet 90 tablet 0    Sig: Take 1 tablet (15 mg total) by mouth at bedtime.     Psychiatry: Antidepressants - mirtazapine Passed - 01/23/2022 11:52 AM      Passed - Valid encounter within last 6 months    Recent Outpatient Visits          1 week ago Encounter for [employer] annual wellness exam   [place], DO   4 weeks ago Unintentional weight loss   [place], DO   2 months ago Weight loss   [place], DO   4 months ago Hospital discharge follow-up   [place], DO   5 months ago Hypotension, unspecified hypotension type   [facility] Teodora Medici, DO      Future Appointments            In 1 week Teodora Medici, [facility], [facility]

## 2022-01-31 ENCOUNTER — Other Ambulatory Visit: Payer: Self-pay | Admitting: Internal Medicine

## 2022-01-31 NOTE — Telephone Encounter (Signed)
Medication Refill - Medication: pantoprazole (PROTONIX) 40 MG tablet and ferrous [id number] C-folic acid (TRINSICON / FOLTRIN) capsule  Has the patient contacted their pharmacy? Yes.   Pt told to contact provider  Preferred Pharmacy (with phone number or street name):  [id number] [place] Phone:  [phone] Fax:  [phone]    Has the patient been seen for an appointment in the last year OR does the patient have an upcoming appointment? Yes.    Agent: Please be advised that RX refills may take up to 3 business days. We ask that you follow-up with your pharmacy.

## 2022-02-01 MED ORDER — PANTOPRAZOLE SODIUM 40 MG PO TBEC: 40.0000 mg | DELAYED_RELEASE_TABLET | Freq: Two times a day (BID) | ORAL | 1 refills | Status: DC

## 2022-02-01 NOTE — Telephone Encounter (Signed)
Requested Prescriptions  Pending Prescriptions Disp Refills  . pantoprazole (PROTONIX) 40 MG tablet 60 tablet 2    Sig: Take 1 tablet (40 mg total) by mouth 2 (two) times daily before a meal.     Gastroenterology: Proton Pump Inhibitors Passed - 01/31/2022  4:02 PM      Passed - Valid encounter within last 12 months    Recent Outpatient Visits          2 weeks ago Encounter for [employer] annual wellness exam   [facility] Teodora Medici, DO   1 month ago Unintentional weight loss   [place], DO   3 months ago Weight loss   [place], DO   4 months ago Hospital discharge follow-up   [facility] Teodora Medici, DO   5 months ago Hypotension, unspecified hypotension type   [facility] Teodora Medici, DO             . ferrous [id number] C-folic acid (TRINSICON / FOLTRIN) capsule 60 capsule 2    Sig: Take 1 capsule by mouth 2 (two) times daily after a meal.     Off-Protocol Failed - 01/31/2022  4:02 PM      Failed - Medication not assigned to a protocol, review manually.      Passed - Valid encounter within last 12 months    Recent Outpatient Visits          2 weeks ago Encounter for [employer] annual wellness exam   [facility] Teodora Medici, DO   1 month ago Unintentional weight loss   [place], DO   3 months ago Weight loss   [place], DO   4 months ago Hospital discharge follow-up   [place], DO   5 months ago Hypotension, unspecified hypotension type   [facility] Teodora Medici, [place]

## 2022-02-01 NOTE — Telephone Encounter (Signed)
Requested medication (s) are due for refill today: yes  Requested medication (s) are on the active medication list: yes  Last refill:  09/11/21 #60/2  Future visit scheduled: no  Notes to clinic:  .rx     Requested Prescriptions  Pending Prescriptions Disp Refills   ferrous [id number] C-folic acid (TRINSICON / FOLTRIN) capsule 60 capsule 2    Sig: Take 1 capsule by mouth 2 (two) times daily after a meal.     Off-Protocol Failed - 01/31/2022  4:02 PM      Failed - Medication not assigned to a protocol, review manually.      Passed - Valid encounter within last 12 months    Recent Outpatient Visits           2 weeks ago Encounter for [employer] annual wellness exam   [facility] Teodora Medici, DO   1 month ago Unintentional weight loss   [place], DO   3 months ago Weight loss   [place], DO   4 months ago Hospital discharge follow-up   [facility] Teodora Medici, DO   5 months ago Hypotension, unspecified hypotension type   [place], DO              Signed Prescriptions Disp Refills   pantoprazole (PROTONIX) 40 MG tablet 180 tablet 1    Sig: Take 1 tablet (40 mg total) by mouth 2 (two) times daily before a meal.     Gastroenterology: Proton Pump Inhibitors Passed - 01/31/2022  4:02 PM      Passed - Valid encounter within last 12 months    Recent Outpatient Visits           2 weeks ago Encounter for Medicare annual wellness exam   [facility] Teodora Medici, DO   1 month ago Unintentional weight loss   [place], DO   3 months ago Weight loss   [place], DO   4 months ago Hospital discharge follow-up   [place], DO   5 months ago  Hypotension, unspecified hypotension type   [facility] Teodora Medici, [place]

## 2022-02-02 ENCOUNTER — Telehealth: Payer: Self-pay | Admitting: Internal Medicine

## 2022-02-02 DIAGNOSIS — E46 Unspecified protein-calorie malnutrition: Secondary | ICD-10-CM

## 2022-02-02 DIAGNOSIS — R634 Abnormal weight loss: Secondary | ICD-10-CM

## 2022-02-02 MED ORDER — FE FUMARATE-B12-VIT C-FA-IFC PO CAPS: 1.0000 | ORAL_CAPSULE | Freq: Two times a day (BID) | ORAL | 2 refills | Status: DC

## 2022-02-03 NOTE — Telephone Encounter (Signed)
Refused Remeron 7.5 mg refill request.  Discontinued 12/26/2021.  Dose now 15 mg.

## 2022-02-06 ENCOUNTER — Ambulatory Visit: Payer: Medicare HMO | Admitting: Internal Medicine

## 2022-05-17 ENCOUNTER — Other Ambulatory Visit: Payer: Self-pay | Admitting: Internal Medicine

## 2022-05-17 NOTE — Telephone Encounter (Signed)
Requested medication (s) are due for refill today: na  Requested medication (s) are on the active medication list: no  Last refill:  na  Future visit scheduled: no  Notes to clinic:  ferocon capsule not on medication list. Medication not assigned to a protocol. Do you want to order / refill Rx?     Requested Prescriptions  Pending Prescriptions Disp Refills   FEROCON capsule [Pharmacy Med Name: FEROCON CAPSULE] 60 capsule 2    Sig: TAKE 1 CAPSULE BY MOUTH 2 (TWO) TIMES DAILY AFTER A MEAL.     Off-Protocol Failed - 05/17/2022  2:03 AM      Failed - Medication not assigned to a protocol, review manually.      Passed - Valid encounter within last 12 months    Recent Outpatient Visits           4 months ago Encounter for [employer] annual wellness exam   [facility] Teodora Medici, DO   4 months ago Unintentional weight loss   [place], DO   6 months ago Weight loss   [place], DO   7 months ago Hospital discharge follow-up   [place], DO   9 months ago Hypotension, unspecified hypotension type   [facility] Teodora Medici, [place]

## 2022-06-05 NOTE — Progress Notes (Unsigned)
Established Patient Office Visit  Subjective:  Patient ID: Angela Horton, female    DOB: Jun 12, 1941  Age: 81 y.o. MRN: [id number]  CC:  No chief complaint on file.   HPI Angela Horton presents for follow up.  Unintentional weight Loss: -Total weight loss of over 40 pounds since December, weight at last office visit 106 pounds, today 103. -On Protonix 40 mg twice daily, Ensure  -CT A/P 08/25/21 showing wall thickening of the distal esophagus, suspicious for esophagitis or reflux.  Mild prepyloric gastric wall thickening versus peristalsis.  Mild central intrahepatic biliary ductal dilation without common bile duct dilation without evidence of obstructing mass.  Mild hepatic steatosis.  Left ureterocele.  -Now following with GI, ERCP performed on 10/27/2021, which showed mildly dilated main bile duct with biliary sphincterotomy performed. Biopsies taken, negative for malignancy. Tubulovillous adenoma of the ampulla - GI recommending EUS but patient does not want more procedures  -Follow up with GI on 12/21/21, note reviewed.  -She states her appetite is good, she eats oatmeal and waffles in the morning.  Her husband states she only eats about 25% of each meal and tries to supplement Ensure on top of that.  She denies abdominal pain, nausea, vomiting.  She has a bowel movement every day.  She denies any urinary symptoms.  She denies fevers or chills.  -Currently on Remeron 15 mg daily, which was increased at [place].   Diabetes, Type 2: -Last A1c 6.2 6/23 -Medications: Nothing currently  -Patient is compliant with the above medications and reports no side effects.  -Eye exam: Due -Foot exam: Due today -Microalbumin: Due -Statin: No -PNA vaccine: Prevnar 13 in 2015, Prevnar 23 in 2011  HLD: -Medications: Crestor 20 mg -Patient is compliant with above medications and reports no side effects. *** -Last lipid panel: Lipid Panel     Component Value Date/Time   CHOL 86 01/25/2021 0907    CHOL 198 01/07/2015 1117   TRIG 143 09/08/2021 1225   HDL 26 (L) 01/25/2021 0907   HDL 39 (L) 01/07/2015 1117   CHOLHDL 3.3 01/25/2021 0907   VLDL 19 02/15/2017 0901   LDLCALC 45 01/25/2021 0907   LABVLDL 20 01/07/2015 1117      Past Medical History:  Diagnosis Date   Diabetes mellitus without complication ([place])    Gout    Hyperlipidemia    Hypertension    Obesity     Past Surgical History:  Procedure Laterality Date   ABDOMINAL HYSTERECTOMY     COLONOSCOPY WITH PROPOFOL N/A 09/11/2021   Procedure: COLONOSCOPY WITH PROPOFOL;  Surgeon: Lin Landsman, MD;  Location: [facility] ENDOSCOPY;  Service: Gastroenterology;  Laterality: N/A;   ERCP N/A 10/27/2021   Procedure: ENDOSCOPIC RETROGRADE CHOLANGIOPANCREATOGRAPHY (ERCP);  Surgeon: Lucilla Lame, MD;  [facility] END[facility]COPY;  Service: Endoscopy;  Laterality: N/A;   ESOPHAGOGASTRODUODENOSCOPY (EGD) WITH PROPOFOL N/A 09/11/2021   Procedure: ESOPHAGOGASTRODUODENOSCOPY (EGD) WITH PROPOFOL;  Surgeon: Lin Landsman, MD;  Location: [facility] ENDOSCOPY;  Service: Gastroenterology;  Laterality: N/A;   EUS N/A 09/29/2021   Procedure: UPPER ENDOSCOPIC ULTRASOUND (EUS) LINEAR;  Surgeon: Holly Bodily, MD;  Location: [facility] ENDOSCOPY;  Service: Gastroenterology;  Laterality: N/A;    Family History  Problem Relation Age of Onset   Kidney disease Mother    Kidney disease Sister    Stroke Brother    Heart disease Sister    Stroke Sister     Social History   Socioeconomic History   Marital status: Married  Spouse name: Not on file   Number of children: 4   Years of education: Not on file   Highest education level: Not on file  Occupational History   Not on file  Tobacco Use   Smoking status: Never   Smokeless tobacco: Never   Tobacco comments:    Smoking cessation materials not required  Vaping Use   Vaping Use: Never used  Substance and Sexual Activity   Alcohol use: No    Alcohol/week: 0.0 standard drinks of alcohol    Drug use: No   Sexual activity: Yes  Other Topics Concern   Not on file  Social History Narrative   Not on file   Social Determinants of Health   Financial Resource Strain: Low Risk  (01/11/2021)   Overall Financial Resource Strain (CARDIA)    Difficulty of Paying Living Expenses: Not hard at all  Food Insecurity: No Food Insecurity (01/11/2021)   Hunger Vital Sign    Worried About Running Out of Food in the Last Year: Never true    Ran Out of Food in the Last Year: Never true  Transportation Needs: No Transportation Needs (01/11/2021)   PRAPARE - Hydrologist (Medical): No    Lack of Transportation (Non-Medical): No  Physical Activity: Inactive (01/11/2021)   Exercise Vital Sign    Days of Exercise per Week: 0 days    Minutes of Exercise per Session: 0 min  Stress: No Stress Concern Present (01/11/2021)   [place]    Feeling of Stress : Not at all  Social Connections: Moderately Integrated (01/11/2021)   Social Connection and Isolation Panel [NHANES]    Frequency of Communication with Friends and Family: More than three times a week    Frequency of Social Gatherings with Friends and Family: More than three times a week    Attends Religious Services: More than 4 times per year    Active Member of [employer] or Organizations: No    Attends Archivist Meetings: Never    Marital Status: Married  Human resources officer Violence: Not At Risk (01/11/2021)   Humiliation, Afraid, Rape, and Kick questionnaire    Fear of Current or Ex-Partner: No    Emotionally Abused: No    Physically Abused: No    Sexually Abused: No    Outpatient Medications Prior to Visit  Medication Sig Dispense Refill   brimonidine (ALPHAGAN) 0.2 % ophthalmic solution Place 1 drop into both eyes 2 (two) times daily.  3   feeding supplement (ENSURE ENLIVE / ENSURE PLUS) LIQD Take 237 mLs by mouth 2 (two) times daily  between meals. 237 mL 12   FEROCON capsule TAKE 1 CAPSULE BY MOUTH 2 (TWO) TIMES DAILY AFTER A MEAL. 60 capsule 2   mirtazapine (REMERON) 15 MG tablet Take 1 tablet (15 mg total) by mouth at bedtime. 90 tablet 0   pantoprazole (PROTONIX) 40 MG tablet Take 1 tablet (40 mg total) by mouth 2 (two) times daily before a meal. 180 tablet 1   rosuvastatin (CRESTOR) 20 MG tablet TAKE 1 TABLET BY MOUTH EVERYDAY AT BEDTIME 90 tablet 2   No facility-administered medications prior to visit.    Allergies  Allergen Reactions   Metformin And Related     Upset stomach headaches    ROS Review of Systems  Constitutional:  Positive for unexpected weight change. Negative for chills and fever.  Eyes:  Negative for visual disturbance.  Respiratory:  Negative for cough and shortness of breath.   Cardiovascular:  Negative for chest pain.  Gastrointestinal:  Negative for abdominal pain, blood in stool, constipation, diarrhea, nausea and vomiting.  Genitourinary:  Negative for dysuria and hematuria.  Neurological:  Positive for weakness. Negative for dizziness and light-headedness.      Objective:    Physical Exam Constitutional:      Appearance: Normal appearance.     Comments: Frail appearing, walks with walker  HENT:     Head: Normocephalic and atraumatic.  Eyes:     Conjunctiva/sclera: Conjunctivae normal.  Cardiovascular:     Rate and Rhythm: Normal rate and regular rhythm.     Pulses:          Dorsalis pedis pulses are 2+ on the right side.  Pulmonary:     Effort: Pulmonary effort is normal.     Breath sounds: Normal breath sounds.  Musculoskeletal:     Right lower leg: No edema.     Left lower leg: No edema.     Right foot: Normal range of motion. No deformity, bunion, Charcot foot, foot drop or prominent metatarsal heads.     Left foot: Normal range of motion. No deformity, bunion, Charcot foot, foot drop or prominent metatarsal heads.  Feet:     Right foot:     Protective Sensation:  6 sites tested.  6 sites sensed.     Skin integrity: Skin integrity normal.     Toenail Condition: Right toenails are normal.     Left foot:     Protective Sensation: 6 sites tested.  6 sites sensed.     Skin integrity: Skin integrity normal.     Toenail Condition: Left toenails are normal.     Comments: Bilateral swelling in feet Skin:    General: Skin is warm and dry.  Neurological:     General: No focal deficit present.     Mental Status: She is alert. Mental status is at baseline.  Psychiatric:        Mood and Affect: Mood normal.        Behavior: Behavior normal.     There were no vitals taken for this visit. Wt Readings from Last 3 Encounters:  01/16/22 111 lb 8 oz (50.6 kg)  12/26/21 103 lb 12.8 oz (47.1 kg)  12/21/21 102 lb 8 oz (46.5 kg)     Health Maintenance Due  Topic Date Due   Zoster Vaccines- Shingrix (1 of 2) Never done   OPHTHALMOLOGY EXAM  10/23/2018   Diabetic kidney evaluation - Urine ACR  07/27/2019   DTaP/Tdap/Td (2 - Tdap) 06/13/2020   INFLUENZA VACCINE  01/31/2022   COVID-19 Vaccine (4 - 2023-24 season) 03/03/2022    There are no preventive care reminders to display for this patient.  Lab Results  Component Value Date   TSH 2.900 09/09/2021   Lab Results  Component Value Date   WBC 5.1 09/08/2021   HGB 10.2 (L) 09/08/2021   HCT 31.2 (L) 09/08/2021   MCV 87.4 09/08/2021   PLT 278 09/08/2021   Lab Results  Component Value Date   NA 136 10/27/2021   K 3.2 (L) 10/27/2021   CO2 27 10/27/2021   GLUCOSE 114 (H) 10/27/2021   BUN 20 10/27/2021   CREATININE 1.00 10/27/2021   BILITOT 0.8 09/08/2021   ALKPHOS 31 (L) 09/08/2021   AST 56 (H) 09/08/2021   ALT 21 09/08/2021   PROT 6.7 09/08/2021   ALBUMIN 3.1 (L) 09/08/2021  CALCIUM 9.4 10/27/2021   ANIONGAP 11 10/27/2021   EGFR 38 (L) 06/07/2021   Lab Results  Component Value Date   CHOL 86 01/25/2021   Lab Results  Component Value Date   HDL 26 (L) 01/25/2021   Lab Results   Component Value Date   LDLCALC 45 01/25/2021   Lab Results  Component Value Date   TRIG 143 09/08/2021   Lab Results  Component Value Date   CHOLHDL 3.3 01/25/2021   Lab Results  Component Value Date   HGBA1C 6.2 (A) 12/26/2021      Assessment & Plan:   1. Unintentional weight loss/Malnutrition, unspecified type (HCC)/Loss of appetite: Lost another 3 pounds since her last office visit.  Follow with GI, however the patient does not want to pursue any further procedures.  Plan to increase Remeron to 15 mg daily.  Follow-up in another 6 weeks for weight recheck and potential increase of medication.  Discussed drinking at least 3 ensures a day on top of any food that she consumes.  Continue pantoprazole 40 mg twice daily.  Continue to monitor weight.  - mirtazapine (REMERON) 15 MG tablet; Take 1 tablet (15 mg total) by mouth at bedtime.  Dispense: 90 tablet; Refill: 0  2. Type 2 diabetes mellitus with other specified complication, without long-term current use of insulin ([place]): A1c good today at 6.2%.  Foot exam done today.  We will try to get microalbumin at follow-up, as the patient states she cannot void today.  - POCT HgB A1C - HM Diabetes Foot Exam   Follow-up: No follow-ups on file.    Teodora Medici, DO

## 2022-06-06 ENCOUNTER — Encounter: Payer: Self-pay | Admitting: Internal Medicine

## 2022-06-06 ENCOUNTER — Ambulatory Visit (INDEPENDENT_AMBULATORY_CARE_PROVIDER_SITE_OTHER): Payer: Medicare HMO | Admitting: Internal Medicine

## 2022-06-06 VITALS — BP 136/82 | HR 88 | Temp 98.4°F | Resp 16 | Ht 68.0 in | Wt 142.0 lb

## 2022-06-06 DIAGNOSIS — E46 Unspecified protein-calorie malnutrition: Secondary | ICD-10-CM | POA: Diagnosis not present

## 2022-06-06 DIAGNOSIS — E1165 Type 2 diabetes mellitus with hyperglycemia: Secondary | ICD-10-CM

## 2022-06-06 DIAGNOSIS — Z23 Encounter for immunization: Secondary | ICD-10-CM | POA: Diagnosis not present

## 2022-06-06 DIAGNOSIS — Z0184 Encounter for antibody response examination: Secondary | ICD-10-CM | POA: Diagnosis not present

## 2022-06-06 LAB — POCT GLYCOSYLATED HEMOGLOBIN (HGB A1C): Hemoglobin A1C: 6.1 % — AB (ref 4.0–5.6)

## 2022-06-07 LAB — MICROALBUMIN / CREATININE URINE RATIO
Creatinine, Urine: 110 mg/dL (ref 20–275)
Microalb Creat Ratio: 55 mcg/mg creat — ABNORMAL HIGH (ref <30)
Microalb, Ur: 6 mg/dL

## 2022-09-07 ENCOUNTER — Other Ambulatory Visit: Payer: Self-pay | Admitting: Internal Medicine

## 2022-09-07 NOTE — Telephone Encounter (Signed)
Requested medication (s) are due for refill today: yes  Requested medication (s) are on the active medication list: yes  Last refill:  05/17/22 #60 2 refills  Future visit scheduled: yes in 3 months  Notes to clinic:  medication not assigned to a protocol. Do you want to refill Rx?     Requested Prescriptions  Pending Prescriptions Disp Refills   FEROCON capsule [Pharmacy Med Name: FEROCON CAPSULE] 100 capsule 1    Sig: TAKE 1 CAPSULE BY MOUTH 2 (TWO) TIMES DAILY AFTER A MEAL.     Off-Protocol Failed - 09/07/2022  1:53 AM      Failed - Medication not assigned to a protocol, review manually.      Passed - Valid encounter within last 12 months    Recent Outpatient Visits           3 months ago Type 2 diabetes mellitus with hyperglycemia, without long-term current use of insulin [facility])   [facility] Teodora Medici, DO   7 months ago Encounter for [employer] annual wellness exam   [facility] Teodora Medici, DO   8 months ago Unintentional weight loss   [facility] Teodora Medici, DO   10 months ago Weight loss   [facility] Teodora Medici, [place]   11 months ago Hospital discharge follow-up   [facility] Teodora Medici, DO       Future Appointments             In 3 months Teodora Medici, [facility], [facility]

## 2022-09-18 NOTE — Progress Notes (Unsigned)
   Acute Office Visit  Subjective:     Patient ID: Angela Horton, female    DOB: 01/31/1941, 82 y.o.   MRN: [id number]  No chief complaint on file.   HPI Patient is in today for ear pain.   EAR PAIN Duration: {Blank single:19197::"days","weeks","months"} Involved ear(s): {Blank single:19197::"left","right","bilateral"} Severity:  {Blank single:19197::"mild","moderate","severe","1/10","2/10","3/10","4/10","5/10","6/10","7/10","8/10","9/10","10/10"}  Quality:  {Blank multiple:19196::"sharp","dull","aching","burning","cramping","ill-defined","itchy","pressure-like","pulling","shooting","sore","stabbing","tender","tearing","throbbing"} Fever: {Blank single:19197::"yes","no"} Otorrhea: {Blank single:19197::"yes","no"} Upper respiratory infection symptoms: {Blank single:19197::"yes","no"} Pruritus: {Blank single:19197::"yes","no"} Hearing loss: {Blank single:19197::"yes","no"} Water immersion {Blank single:19197::"yes","no"} Using Q-tips: {Blank single:19197::"yes","no"} Recurrent otitis media: {Blank single:19197::"yes","no"} Status: {Blank multiple:19196::"better","worse","stable","fluctuating"} Treatments attempted: {Blank single:19197::"none","pseudoephedrine"}   ROS      Objective:    There were no vitals taken for this visit. {Vitals History (Optional):23777}  Physical Exam  No results found for any visits on 09/19/22.      Assessment & Plan:   Problem List Items Addressed This Visit   None   No orders of the defined types were placed in this encounter.   No follow-ups on file.  Teodora Medici, DO

## 2022-09-19 ENCOUNTER — Encounter: Payer: Self-pay | Admitting: Internal Medicine

## 2022-09-19 ENCOUNTER — Ambulatory Visit (INDEPENDENT_AMBULATORY_CARE_PROVIDER_SITE_OTHER): Payer: Medicare HMO | Admitting: Internal Medicine

## 2022-09-19 VITALS — BP 118/82 | HR 68 | Temp 98.5°F | Resp 16 | Ht 68.0 in | Wt 155.7 lb

## 2022-09-19 DIAGNOSIS — H6501 Acute serous otitis media, right ear: Secondary | ICD-10-CM | POA: Diagnosis not present

## 2022-09-19 MED ORDER — AMOXICILLIN-POT CLAVULANATE 875-125 MG PO TABS
1.0000 | ORAL_TABLET | Freq: Two times a day (BID) | ORAL | 0 refills | Status: AC
Start: 2022-09-19 — End: 2022-09-24

## 2022-09-28 ENCOUNTER — Other Ambulatory Visit: Payer: Self-pay | Admitting: Internal Medicine

## 2022-09-28 DIAGNOSIS — R634 Abnormal weight loss: Secondary | ICD-10-CM

## 2022-09-28 DIAGNOSIS — E46 Unspecified protein-calorie malnutrition: Secondary | ICD-10-CM

## 2022-09-29 NOTE — Telephone Encounter (Signed)
Requested Prescriptions  Pending Prescriptions Disp Refills   FEROCON capsule [Pharmacy Med Name: FEROCON CAPSULE] 60 capsule 2    Sig: TAKE 1 CAPSULE BY MOUTH 2 (TWO) TIMES DAILY AFTER A MEAL.     Off-Protocol Failed - 09/28/2022  6:42 PM      Failed - Medication not assigned to a protocol, review manually.      Passed - Valid encounter within last 12 months    Recent Outpatient Visits           1 week ago Non-recurrent acute serous otitis media of right ear   [facility] Teodora Medici, DO   3 months ago Type 2 diabetes mellitus with hyperglycemia, without long-term current use of insulin [facility])   [facility] Teodora Medici, DO   8 months ago Encounter for [employer] annual wellness exam   [facility] Teodora Medici, DO   9 months ago Unintentional weight loss   [facility] Teodora Medici, DO   11 months ago Weight loss   [facility] Teodora Medici, DO       Future Appointments             In 2 months Teodora Medici, [facility], [facility]             mirtazapine (REMERON) 7.5 MG tablet [Pharmacy Med Name: MIRTAZAPINE 7.5 MG TABLET] 90 tablet 0    Sig: TAKE 1 TABLET BY MOUTH AT BEDTIME.     Psychiatry: Antidepressants - mirtazapine Passed - 09/28/2022  6:42 PM      Passed - Valid encounter within last 6 months    Recent Outpatient Visits           1 week ago Non-recurrent acute serous otitis media of right ear   [facility] Teodora Medici, DO   3 months ago Type 2 diabetes mellitus with hyperglycemia, without long-term current use of insulin [facility])   [place], DO   8 months ago Encounter for [employer] annual wellness exam   [facility] Teodora Medici, DO   9 months ago Unintentional  weight loss   [facility] Teodora Medici, DO   11 months ago Weight loss   [facility] Teodora Medici, DO       Future Appointments             In 2 months Teodora Medici, [facility], [facility]

## 2022-10-18 ENCOUNTER — Other Ambulatory Visit: Payer: Self-pay | Admitting: Family Medicine

## 2022-10-18 ENCOUNTER — Other Ambulatory Visit: Payer: Self-pay | Admitting: Internal Medicine

## 2022-10-18 NOTE — Telephone Encounter (Signed)
Future visit in 1 month.  Requested Prescriptions  Pending Prescriptions Disp Refills   pantoprazole (PROTONIX) 40 MG tablet [Pharmacy Med Name: PANTOPRAZOLE SOD DR 40 MG TAB] 180 tablet 1    Sig: TAKE 1 TABLET (40 MG TOTAL) BY MOUTH TWICE A DAY BEFORE MEALS     Gastroenterology: Proton Pump Inhibitors Passed - 10/18/2022  1:28 AM      Passed - Valid encounter within last 12 months    Recent Outpatient Visits           4 weeks ago Non-recurrent acute serous otitis media of right ear   [facility] Margarita Mail, DO   4 months ago Type 2 diabetes mellitus with hyperglycemia, without long-term current use of insulin [facility])    [facility] Margarita Mail, DO   9 months ago Encounter for [employer] annual wellness exam   [facility] Margarita Mail, DO   9 months ago Unintentional weight loss   [facility] Margarita Mail, DO   11 months ago Weight loss   [facility] Margarita Mail, DO       Future Appointments             In 1 month Margarita Mail, DO [facility], [facility]

## 2022-10-19 ENCOUNTER — Telehealth: Payer: Self-pay | Admitting: Internal Medicine

## 2022-10-19 NOTE — Telephone Encounter (Signed)
Rx not on med profile and not listed in OV notes  Requested Prescriptions  Refused Prescriptions Disp Refills   amLODipine (NORVASC) 10 MG tablet [Pharmacy Med Name: AMLODIPINE BESYLATE 10 MG TAB] 90 tablet 2    Sig: TAKE 1 TABLET BY MOUTH EVERY DAY     Cardiovascular: Calcium Channel Blockers 2 Passed - 10/18/2022  3:27 PM      Passed - Last BP in normal range    BP Readings from Last 1 Encounters:  09/19/22 118/82         Passed - Last Heart Rate in normal range    Pulse Readings from Last 1 Encounters:  09/19/22 68         Passed - Valid encounter within last 6 months    Recent Outpatient Visits           1 month ago Non-recurrent acute serous otitis media of right ear   [facility] Margarita Mail, DO   4 months ago Type 2 diabetes mellitus with hyperglycemia, without long-term current use of insulin [facility])   [facility] Margarita Mail, DO   9 months ago Encounter for [employer] annual wellness exam   [facility] Margarita Mail, DO   9 months ago Unintentional weight loss   [facility] Margarita Mail, DO   11 months ago Weight loss   [facility] Margarita Mail, DO       Future Appointments             In 1 month Margarita Mail, DO [facility], [facility]

## 2022-10-19 NOTE — Telephone Encounter (Signed)
Pt states that she cannot get Ferocon refilled in [place] they are not able to get any in stock. The closest place is in [place] and she has no transportation there. She is wondering what she should do or if your able to call in something different. Stated she only have four pills left. Please advise she hoping for a return call today [phone]

## 2022-10-23 ENCOUNTER — Other Ambulatory Visit: Payer: Self-pay | Admitting: Internal Medicine

## 2022-10-24 NOTE — Telephone Encounter (Signed)
Left detailed vm °

## 2022-10-31 ENCOUNTER — Telehealth: Payer: Self-pay | Admitting: Internal Medicine

## 2022-10-31 NOTE — Telephone Encounter (Signed)
Called patient to schedule Medicare Annual Wellness Visit (AWV). Left message for patient to call back and schedule Medicare Annual Wellness Visit (AWV).  Last date of AWV: 01/16/2022  Please schedule an appointment at any time with [facility].  If any questions, please contact me at [phone].  Thank you ,  Randon Goldsmith Care Guide [facility] AWV TEAM Direct Dial: [phone]

## 2022-11-08 ENCOUNTER — Telehealth: Payer: Self-pay | Admitting: Internal Medicine

## 2022-11-08 ENCOUNTER — Other Ambulatory Visit: Payer: Self-pay | Admitting: Internal Medicine

## 2022-11-08 NOTE — Telephone Encounter (Signed)
Medication Refill - Medication: FEROCON capsule   Has the patient contacted their pharmacy? Yes.    Preferred Pharmacy (with phone number or street name):   [id number] - [place], [place] - [address]  [address] [place] [address]  Phone: [phone] Fax: [phone]  Hours: Not open 24 hours     Has the patient been seen for an appointment in the last year OR does the patient have an upcoming appointment? Yes.    Agent: Please be advised that RX refills may take up to 3 business days. We ask that you follow-up with your pharmacy.

## 2022-11-08 NOTE — Telephone Encounter (Signed)
The patient called stating she cannot get or find the FEROCON capsule anywhere. She says no one is carrying that now and wants to know what she should do. Please assist patient further as she uses   [id number] [place],  - [address] Phone: [phone]  Fax: [phone]     She has been out of this medicine for a few weeks and the pharmacy told her on Monday she needs to call her doctor and get something else.

## 2022-11-08 NOTE — Telephone Encounter (Signed)
Requested medication (s) are due for refill today: routing for review  Requested medication (s) are on the active medication list: yes  Last refill:  09/07/22  Future visit scheduled: yes  Notes to clinic:  Medication not assigned to a protocol, review manually.      Requested Prescriptions  Pending Prescriptions Disp Refills   ferrous fumarate-b12-vitamic C-folic acid (FEROCON) capsule 100 capsule 1     Off-Protocol Failed - 11/08/2022  3:48 PM      Failed - Medication not assigned to a protocol, review manually.      Passed - Valid encounter within last 12 months    Recent Outpatient Visits           1 month ago Non-recurrent acute serous otitis media of right ear   [facility] Margarita Mail, DO   5 months ago Type 2 diabetes mellitus with hyperglycemia, without long-term current use of insulin [facility])   [facility] Margarita Mail, DO   9 months ago Encounter for [employer] annual wellness exam   [facility] Margarita Mail, DO   10 months ago Unintentional weight loss   [facility] Margarita Mail, DO   1 year ago Weight loss   [facility] Margarita Mail, DO       Future Appointments             In 4 weeks Margarita Mail, DO [facility], [facility]

## 2022-11-08 NOTE — Telephone Encounter (Signed)
Left another detailed vm 

## 2022-11-09 MED ORDER — FEROCON PO CAPS: ORAL_CAPSULE | ORAL | 1 refills | Status: AC

## 2022-11-13 ENCOUNTER — Other Ambulatory Visit: Payer: Self-pay | Admitting: Internal Medicine

## 2022-11-13 NOTE — Telephone Encounter (Signed)
Requested medication (s) are due for refill today - no  Requested medication (s) are on the active medication list -yes  Future visit scheduled -yes  Last refill: 11/09/22  Notes to clinic: Pharmacy request: unable to obtain- on back order- Alternative requested  Requested Prescriptions  Pending Prescriptions Disp Refills   FEROCON capsule [Pharmacy Med Name: FEROCON CAPSULE] 100 capsule 1    Sig: TAKE 1 CAPSULE BY MOUTH 2 (TWO) TIMES DAILY AFTER A MEAL.     Off-Protocol Failed - 11/13/2022 11:19 AM      Failed - Medication not assigned to a protocol, review manually.      Passed - Valid encounter within last 12 months    Recent Outpatient Visits           1 month ago Non-recurrent acute serous otitis media of right ear   [facility] Margarita Mail, DO   5 months ago Type 2 diabetes mellitus with hyperglycemia, without long-term current use of insulin [facility])   [facility] Margarita Mail, DO   10 months ago Encounter for [employer] annual wellness exam   [facility] Margarita Mail, DO   10 months ago Unintentional weight loss   [facility] Margarita Mail, DO   1 year ago Weight loss   [facility] Margarita Mail, DO       Future Appointments             In 3 weeks Margarita Mail, DO [facility]               Requested Prescriptions  Pending Prescriptions Disp Refills   FEROCON capsule [Pharmacy Med Name: FEROCON CAPSULE] 100 capsule 1    Sig: TAKE 1 CAPSULE BY MOUTH 2 (TWO) TIMES DAILY AFTER A MEAL.     Off-Protocol Failed - 11/13/2022 11:19 AM      Failed - Medication not assigned to a protocol, review manually.      Passed - Valid encounter within last 12 months    Recent Outpatient Visits           1 month ago Non-recurrent acute serous otitis media of right ear   [facility] Margarita Mail, DO   5 months ago Type 2 diabetes mellitus with hyperglycemia, without long-term current use of insulin [facility])   [facility] Margarita Mail, DO   10 months ago Encounter for [employer] annual wellness exam   [facility] Margarita Mail, DO   10 months ago Unintentional weight loss   [facility] Margarita Mail, DO   1 year ago Weight loss   [facility] Margarita Mail, DO       Future Appointments             In 3 weeks Margarita Mail, DO [facility]

## 2022-12-06 ENCOUNTER — Ambulatory Visit: Payer: Medicare HMO | Admitting: Internal Medicine

## 2022-12-17 ENCOUNTER — Encounter: Payer: Self-pay | Admitting: Internal Medicine

## 2022-12-17 NOTE — Progress Notes (Unsigned)
Established Patient Office Visit  Subjective:  Patient ID: Angela Horton, female    DOB: 1941-05-27  Age: 82 y.o. MRN: [id number]  CC:  No chief complaint on file.   HPI Angela Horton presents for follow up.  She is with her husband.  Unintentional weight Loss: -Total weight loss of over 40 pounds since December 2022, weight at last office visit 106 pounds in June.  Today her weight is 142.  She states she has regained her appetite and is eating 3 meals a day and was able to gain weight.  Overall she feels well. -On Protonix 40 mg twice daily, no acid reflux symptoms -CT A/P 08/25/21 showing wall thickening of the distal esophagus, suspicious for esophagitis or reflux.  Mild prepyloric gastric wall thickening versus peristalsis.  Mild central intrahepatic biliary ductal dilation without common bile duct dilation without evidence of obstructing mass.  Mild hepatic steatosis.  Left ureterocele.  -Now following with GI, ERCP performed on 10/27/2021, which showed mildly dilated main bile duct with biliary sphincterotomy performed. Biopsies taken, negative for malignancy. Tubulovillous adenoma of the ampulla - GI recommending EUS but patient does not want more procedures  -She denies abdominal pain, nausea, vomiting.  She has a bowel movement every day.  She denies any urinary symptoms.  She denies fevers or chills.  -Had been on Remeron 15 mg previously, however after she regained her appetite she discontinued this medication and is not on it currently.  Diabetes, Type 2: -Last A1c 6.1 12/23 -Medications: Nothing currently  -Patient is compliant with the above medications and reports no side effects.  -Eye exam: Due has an ophthalmologist she follows with -Foot exam: Due -Microalbumin: UTD 12/23, elevated -Statin: Yes -PNA vaccine: Prevnar 13 in 2015, Prevnar 23 in 2011  HLD: -Medications: Crestor 20 mg -Patient is compliant with above medications and reports no side effects.  -Last  lipid panel: Lipid Panel     Component Value Date/Time   CHOL 86 01/25/2021 0907   CHOL 198 01/07/2015 1117   TRIG 143 09/08/2021 1225   HDL 26 (L) 01/25/2021 0907   HDL 39 (L) 01/07/2015 1117   CHOLHDL 3.3 01/25/2021 0907   VLDL 19 02/15/2017 0901   LDLCALC 45 01/25/2021 0907   LABVLDL 20 01/07/2015 1117    Past Medical History:  Diagnosis Date   Diabetes mellitus without complication (HCC)    Gout    Hyperlipidemia    Hypertension    Obesity     Past Surgical History:  Procedure Laterality Date   ABDOMINAL HYSTERECTOMY     COLONOSCOPY WITH PROPOFOL N/A 09/11/2021   Procedure: COLONOSCOPY WITH PROPOFOL;  Surgeon: Toney Reil, MD;  Location: [facility] ENDOSCOPY;  Service: Gastroenterology;  Laterality: N/A;   ERCP N/A 10/27/2021   Procedure: ENDOSCOPIC RETROGRADE CHOLANGIOPANCREATOGRAPHY (ERCP);  Surgeon: Midge Minium, MD;  Location: [facility] ENDOSCOPY;  Service: Endoscopy;  Laterality: N/A;   ESOPHAGOGASTRODUODENOSCOPY (EGD) WITH PROPOFOL N/A 09/11/2021   Procedure: ESOPHAGOGASTRODUODENOSCOPY (EGD) WITH PROPOFOL;  Surgeon: Toney Reil, MD;  Location: [facility] ENDOSCOPY;  Service: Gastroenterology;  Laterality: N/A;   EUS N/A 09/29/2021   Procedure: UPPER ENDOSCOPIC ULTRASOUND (EUS) LINEAR;  Surgeon: Bearl Mulberry, MD;  Location[facility] ENDOSCOPY;  Service: Gastroenterology;  Laterality: N/A;    Family History  Problem Relation Age of Onset   Kidney disease Mother    Kidney disease Sister    Stroke Brother    Heart disease Sister    Stroke Sister  Social History   Socioeconomic History   Marital status: Married    Spouse name: Not on file   Number of children: 4   Years of education: Not on file   Highest education level: 12th grade  Occupational History   Not on file  Tobacco Use   Smoking status: Never   Smokeless tobacco: Never   Tobacco comments:    Smoking cessation materials not required  Vaping Use   Vaping Use: Never used  Substance and  Sexual Activity   Alcohol use: No    Alcohol/week: 0.0 standard drinks of alcohol   Drug use: No   Sexual activity: Yes  Other Topics Concern   Not on file  Social History Narrative   Not on file   Social Determinants of Health   Financial Resource Strain: Low Risk  (12/17/2022)   Overall Financial Resource Strain (CARDIA)    Difficulty of Paying Living Expenses: Not hard at all  Food Insecurity: No Food Insecurity (12/17/2022)   Hunger Vital Sign    Worried About Running Out of Food in the Last Year: Never true    Ran Out of Food in the Last Year: Never true  Transportation Needs: No Transportation Needs (12/17/2022)   PRAPARE - Administrator, Civil Service (Medical): No    Lack of Transportation (Non-Medical): No  Physical Activity: Unknown (12/17/2022)   Exercise Vital Sign    Days of Exercise per Week: 0 days    Minutes of Exercise per Session: Not on file  Stress: No Stress Concern Present (12/17/2022)   [employer] of Occupational Health - Occupational Stress Questionnaire    Feeling of Stress : Not at all  Social Connections: Moderately Integrated (12/17/2022)   Social Connection and Isolation Panel [NHANES]    Frequency of Communication with Friends and Family: More than three times a week    Frequency of Social Gatherings with Friends and Family: Once a week    Attends Religious Services: More than 4 times per year    Active Member of [employer] or Organizations: No    Attends Banker Meetings: Not on file    Marital Status: Married  Catering manager Violence: Not At Risk (01/11/2021)   Humiliation, Afraid, Rape, and Kick questionnaire    Fear of Current or Ex-Partner: No    Emotionally Abused: No    Physically Abused: No    Sexually Abused: No    Outpatient Medications Prior to Visit  Medication Sig Dispense Refill   brimonidine (ALPHAGAN) 0.2 % ophthalmic solution Place 1 drop into both eyes 2 (two) times daily.  3   feeding supplement  (ENSURE ENLIVE / ENSURE PLUS) LIQD Take 237 mLs by mouth 2 (two) times daily between meals. 237 mL 12   ferrous fumarate-b12-vitamic C-folic acid (FEROCON) capsule TAKE 1 CAPSULE BY MOUTH 2 (TWO) TIMES DAILY AFTER A MEAL. 100 capsule 1   pantoprazole (PROTONIX) 40 MG tablet TAKE 1 TABLET (40 MG TOTAL) BY MOUTH TWICE A DAY BEFORE MEALS 180 tablet 1   rosuvastatin (CRESTOR) 20 MG tablet TAKE 1 TABLET BY MOUTH EVERYDAY AT BEDTIME 90 tablet 2   No facility-administered medications prior to visit.    Allergies  Allergen Reactions   Metformin And Related     Upset stomach headaches    ROS Review of Systems  Constitutional:  Negative for chills and fever.  Eyes:  Negative for visual disturbance.  Respiratory:  Negative for cough and shortness of breath.  Cardiovascular:  Negative for chest pain.  Gastrointestinal:  Negative for abdominal pain, blood in stool, constipation, diarrhea, nausea and vomiting.  Genitourinary:  Negative for dysuria and hematuria.  Neurological:  Negative for dizziness and light-headedness.      Objective:    Physical Exam Constitutional:      Appearance: Normal appearance.  HENT:     Head: Normocephalic and atraumatic.  Eyes:     Conjunctiva/sclera: Conjunctivae normal.  Cardiovascular:     Rate and Rhythm: Normal rate and regular rhythm.  Pulmonary:     Effort: Pulmonary effort is normal.     Breath sounds: Normal breath sounds.  Musculoskeletal:     Right lower leg: No edema.     Left lower leg: No edema.  Skin:    General: Skin is warm and dry.  Neurological:     General: No focal deficit present.     Mental Status: She is alert. Mental status is at baseline.  Psychiatric:        Mood and Affect: Mood normal.        Behavior: Behavior normal.     There were no vitals taken for this visit. Wt Readings from Last 3 Encounters:  09/19/22 155 lb 11.2 oz (70.6 kg)  06/06/22 142 lb (64.4 kg)  01/16/22 111 lb 8 oz (50.6 kg)     Health  Maintenance Due  Topic Date Due   OPHTHALMOLOGY EXAM  10/23/2018   COVID-19 Vaccine (4 - 2023-24 season) 03/03/2022   Diabetic kidney evaluation - eGFR measurement  10/28/2022   HEMOGLOBIN A1C  12/06/2022   Medicare Annual Wellness (AWV)  01/17/2023    There are no preventive care reminders to display for this patient.  Lab Results  Component Value Date   TSH 2.900 09/09/2021   Lab Results  Component Value Date   WBC 5.1 09/08/2021   HGB 10.2 (L) 09/08/2021   HCT 31.2 (L) 09/08/2021   MCV 87.4 09/08/2021   PLT 278 09/08/2021   Lab Results  Component Value Date   NA 136 10/27/2021   K 3.2 (L) 10/27/2021   CO2 27 10/27/2021   GLUCOSE 114 (H) 10/27/2021   BUN 20 10/27/2021   CREATININE 1.00 10/27/2021   BILITOT 0.8 09/08/2021   ALKPHOS 31 (L) 09/08/2021   AST 56 (H) 09/08/2021   ALT 21 09/08/2021   PROT 6.7 09/08/2021   ALBUMIN 3.1 (L) 09/08/2021   CALCIUM 9.4 10/27/2021   ANIONGAP 11 10/27/2021   EGFR 38 (L) 06/07/2021   Lab Results  Component Value Date   CHOL 86 01/25/2021   Lab Results  Component Value Date   HDL 26 (L) 01/25/2021   Lab Results  Component Value Date   LDLCALC 45 01/25/2021   Lab Results  Component Value Date   TRIG 143 09/08/2021   Lab Results  Component Value Date   CHOLHDL 3.3 01/25/2021   Lab Results  Component Value Date   HGBA1C 6.1 (A) 06/06/2022      Assessment & Plan:   1. Type 2 diabetes mellitus with hyperglycemia, without long-term current use of insulin (HCC): A1c 6.1% today, diabetes controlled.  Urine microalbumin today as well.  Continue to monitor.  - POCT HgB A1C - Urine Microalbumin w/creat. ratio  2. Malnutrition, unspecified type [facility]): Patient had been on Remeron earlier this year, states she got her appetite back and was able to gain 30 pounds.  She is no longer on the medication and is maintaining her weight  well.  Continue to monitor.  3. Vaccine for streptococcus pneumoniae and influenza: Prevnar 20  administered today.  - Pneumococcal conjugate vaccine 20-valent (Prevnar 20)  Follow-up: No follow-ups on file.    Margarita Mail, DO

## 2022-12-18 ENCOUNTER — Ambulatory Visit (INDEPENDENT_AMBULATORY_CARE_PROVIDER_SITE_OTHER): Payer: Medicare HMO | Admitting: Internal Medicine

## 2022-12-18 ENCOUNTER — Encounter: Payer: Self-pay | Admitting: Internal Medicine

## 2022-12-18 VITALS — BP 138/84 | HR 86 | Temp 97.5°F | Resp 18 | Ht 68.0 in | Wt 161.3 lb

## 2022-12-18 DIAGNOSIS — E782 Mixed hyperlipidemia: Secondary | ICD-10-CM

## 2022-12-18 DIAGNOSIS — E1165 Type 2 diabetes mellitus with hyperglycemia: Secondary | ICD-10-CM | POA: Diagnosis not present

## 2022-12-18 DIAGNOSIS — K219 Gastro-esophageal reflux disease without esophagitis: Secondary | ICD-10-CM | POA: Diagnosis not present

## 2022-12-18 LAB — IRON,TIBC AND FERRITIN PANEL: TIBC: 249 mcg/dL (calc) — ABNORMAL LOW (ref 250–450)

## 2022-12-18 MED ORDER — ROSUVASTATIN CALCIUM 20 MG PO TABS: 20.0000 mg | ORAL_TABLET | Freq: Every day | ORAL | 3 refills | Status: DC

## 2022-12-18 MED ORDER — PANTOPRAZOLE SODIUM 40 MG PO TBEC: 40.0000 mg | DELAYED_RELEASE_TABLET | Freq: Every day | ORAL | 1 refills | Status: DC

## 2022-12-18 MED ORDER — ENSURE ENLIVE PO LIQD
237.0000 mL | Freq: Two times a day (BID) | ORAL | 12 refills | Status: AC
Start: 2022-12-18 — End: ?

## 2022-12-19 ENCOUNTER — Ambulatory Visit: Payer: Self-pay | Admitting: *Deleted

## 2022-12-19 LAB — LIPID PANEL
Cholesterol: 127 mg/dL (ref <200)
HDL: 38 mg/dL — ABNORMAL LOW (ref >=50)
LDL Cholesterol (Calc): 63 mg/dL (calc)
Non-HDL Cholesterol (Calc): 89 mg/dL (calc) (ref <130)
Total CHOL/HDL Ratio: 3.3 (calc) (ref <5.0)
Triglycerides: 184 mg/dL — ABNORMAL HIGH (ref <150)

## 2022-12-19 LAB — CBC WITH DIFFERENTIAL/PLATELET
Absolute Monocytes: 525 cells/uL (ref 200–950)
Basophils Absolute: 10 cells/uL (ref 0–200)
Basophils Relative: 0.2 %
Eosinophils Absolute: 70 cells/uL (ref 15–500)
Eosinophils Relative: 1.4 %
HCT: 33.4 % — ABNORMAL LOW (ref 35.0–45.0)
Hemoglobin: 10.9 g/dL — ABNORMAL LOW (ref 11.7–15.5)
Lymphs Abs: 2410 cells/uL (ref 850–3900)
MCH: 29 pg (ref 27.0–33.0)
MCHC: 32.6 g/dL (ref 32.0–36.0)
MCV: 88.8 fL (ref 80.0–100.0)
MPV: 11.7 fL (ref 7.5–12.5)
Monocytes Relative: 10.5 %
Neutro Abs: 1985 cells/uL (ref 1500–7800)
Neutrophils Relative %: 39.7 %
Platelets: 253 10*3/uL (ref 140–400)
RBC: 3.76 10*6/uL — ABNORMAL LOW (ref 3.80–5.10)
RDW: 12.3 % (ref 11.0–15.0)
Total Lymphocyte: 48.2 %
WBC: 5 10*3/uL (ref 3.8–10.8)

## 2022-12-19 LAB — COMPLETE METABOLIC PANEL WITH GFR
AG Ratio: 1.2 (calc) (ref 1.0–2.5)
ALT: 21 U/L (ref 6–29)
AST: 48 U/L — ABNORMAL HIGH (ref 10–35)
Albumin: 4.3 g/dL (ref 3.6–5.1)
Alkaline phosphatase (APISO): 71 U/L (ref 37–153)
BUN/Creatinine Ratio: 19 (calc) (ref 6–22)
BUN: 21 mg/dL (ref 7–25)
CO2: 31 mmol/L (ref 20–32)
Calcium: 10 mg/dL (ref 8.6–10.4)
Chloride: 102 mmol/L (ref 98–110)
Creat: 1.11 mg/dL — ABNORMAL HIGH (ref 0.60–0.95)
Globulin: 3.7 g/dL (calc) (ref 1.9–3.7)
Glucose, Bld: 125 mg/dL — ABNORMAL HIGH (ref 65–99)
Potassium: 4.7 mmol/L (ref 3.5–5.3)
Sodium: 142 mmol/L (ref 135–146)
Total Bilirubin: 0.5 mg/dL (ref 0.2–1.2)
Total Protein: 8 g/dL (ref 6.1–8.1)
eGFR: 49 mL/min/{1.73_m2} — ABNORMAL LOW (ref >=60)

## 2022-12-19 LAB — HEMOGLOBIN A1C
Hgb A1c MFr Bld: 7.3 % of total Hgb — ABNORMAL HIGH (ref <5.7)
Mean Plasma Glucose: 163 mg/dL
eAG (mmol/L): 9 mmol/L

## 2022-12-19 LAB — B12 AND FOLATE PANEL
Folate: >24 ng/mL
Vitamin B-12: 1135 pg/mL — ABNORMAL HIGH (ref 200–1100)

## 2022-12-19 LAB — IRON,TIBC AND FERRITIN PANEL
%SAT: 32 % (calc) (ref 16–45)
Ferritin: 200 ng/mL (ref 16–288)
Iron: 79 ug/dL (ref 45–160)

## 2022-12-19 NOTE — Telephone Encounter (Signed)
  Chief Complaint: Pt called in and was given her lab result message from Dr. Caralee Ates Symptoms:  Frequency:  Pertinent Negatives: Patient denies  Disposition: [] ED /[] Urgent Care (no appt availability in office) / [] Appointment(In office/virtual)/ []  [place]/ [x] Home Care/ [] Refused Recommended Disposition /[] [place] [facility]/ []  Follow-up with PCP Additional Notes:

## 2022-12-19 NOTE — Telephone Encounter (Signed)
Reason for Disposition  [1] Follow-up call to recent contact AND [2] information only call, no triage required  Answer Assessment - Initial Assessment Questions 1. REASON FOR CALL or QUESTION: "What is your reason for calling today?" or "How can I best help you?" or "What question do you have that I can help answer?"     Pt given lab results per notes of Dr. Caralee Ates on 12/19/2022 at 7:57 AM.   Pt verbalized understanding.  Protocols used: Information Only Call - No Triage-A-AH

## 2023-01-19 ENCOUNTER — Ambulatory Visit: Payer: Medicare HMO

## 2023-01-25 ENCOUNTER — Encounter: Payer: Self-pay | Admitting: Internal Medicine

## 2023-01-26 ENCOUNTER — Ambulatory Visit: Payer: Medicare HMO

## 2023-01-26 VITALS — Ht 69.0 in | Wt 161.0 lb

## 2023-01-26 DIAGNOSIS — Z0000 Encounter for general adult medical examination without abnormal findings: Secondary | ICD-10-CM | POA: Diagnosis not present

## 2023-01-26 NOTE — Progress Notes (Signed)
Subjective:   Angela Horton is a 82 y.o. female who presents for Medicare Annual (Subsequent) preventive examination.  Visit Complete: Virtual  I connected with  Fransico Michael on 01/26/23 by a audio enabled telemedicine application and verified that I am speaking with the correct person using two identifiers.  Patient Location: Home  Provider Location: Home Office  I discussed the limitations of evaluation and management by telemedicine. The patient expressed understanding and agreed to proceed.  Vital Signs: Unable to obtain new vitals due to this being a telehealth visit.   Patient Medicare AWV questionnaire was completed by the patient on (not done); I have confirmed that all information answered by patient is correct and no changes since this date.  Review of Systems        Objective:    Today's Vitals   01/26/23 1049  Weight: 161 lb (73 kg)  Height: 5\' 9"  (1.753 m)   Body mass index is 23.78 kg/m.     01/26/2023   10:58 AM 10/27/2021   10:38 AM 09/29/2021   12:02 PM 09/11/2021    9:56 AM 09/08/2021    6:24 PM 09/08/2021   10:42 AM 07/01/2021    9:58 AM  Advanced Directives  Does Patient Have a Medical Advance Directive? Yes Yes Yes No  No Yes  Type of Estate agent of [place];Living will  Healthcare Power of [place];Living will    Healthcare Power of [place];Living will  Does patient want to make changes to medical advance directive?       No - Patient declined  Copy of Healthcare Power of Attorney in Chart?   No - copy requested    No - copy requested  Would patient like information on creating a medical advance directive?     No - Patient declined      Current Medications (verified) Outpatient Encounter Medications as of 01/26/2023  Medication Sig   brimonidine (ALPHAGAN) 0.2 % ophthalmic solution Place 1 drop into both eyes 2 (two) times daily.   feeding supplement (ENSURE ENLIVE / ENSURE PLUS) LIQD Take 237 mLs by mouth 2 (two) times  daily between meals.   ferrous fumarate-b12-vitamic C-folic acid (FEROCON) capsule TAKE 1 CAPSULE BY MOUTH 2 (TWO) TIMES DAILY AFTER A MEAL.   pantoprazole (PROTONIX) 40 MG tablet Take 1 tablet (40 mg total) by mouth daily.   rosuvastatin (CRESTOR) 20 MG tablet Take 1 tablet (20 mg total) by mouth daily.   No facility-administered encounter medications on file as of 01/26/2023.    Allergies (verified) Metformin and related   History: Past Medical History:  Diagnosis Date   Allergy    Allergic to Amlodipine   Anemia    Depression 06/26/2021   Diabetes mellitus without complication (HCC)    GERD (gastroesophageal reflux disease)    Gout    Hyperlipidemia    Hypertension    Myocardial infarction (HCC)    Obesity    Stroke [facility])    Ulcer    Past Surgical History:  Procedure Laterality Date   ABDOMINAL HYSTERECTOMY     COLONOSCOPY WITH PROPOFOL N/A 09/11/2021   Procedure: COLONOSCOPY WITH PROPOFOL;  Surgeon: Toney Reil, MD;  Location: [facility] ENDOSCOPY;  Service: Gastroenterology;  Laterality: N/A;   ERCP N/A 10/27/2021   Procedure: ENDOSCOPIC RETROGRADE CHOLANGIOPANCREATOGRAPHY (ERCP);  Surgeon: Midge Minium, MD;  Location: [facility] ENDOSCOPY;  Service: Endoscopy;  Laterality: N/A;   ESOPHAGOGASTRODUODENOSCOPY (EGD) WITH PROPOFOL N/A 09/11/2021   Procedure: ESOPHAGOGASTRODUODENOSCOPY (EGD) WITH PROPOFOL;  Surgeon: Toney Reil, MD;  Location: [facility] ENDOSCOPY;  Service: Gastroenterology;  Laterality: N/A;   EUS N/A 09/29/2021   Procedure: UPPER ENDOSCOPIC ULTRASOUND (EUS) LINEAR;  Surgeon: Bearl Mulberry, MD;  Location: [facility] ENDOSCOPY;  Service: Gastroenterology;  Laterality: N/A;   TUBAL LIGATION     Family History  Problem Relation Age of Onset   Kidney disease Mother    Kidney disease Sister    Stroke Brother    Heart disease Sister    Stroke Sister    Stroke Sister    Social History   Socioeconomic History   Marital status: Married    Spouse name: Not  on file   Number of children: 4   Years of education: Not on file   Highest education level: 12th grade  Occupational History   Not on file  Tobacco Use   Smoking status: Never   Smokeless tobacco: Never   Tobacco comments:    Smoking cessation materials not required  Vaping Use   Vaping status: Never Used  Substance and Sexual Activity   Alcohol use: No   Drug use: No   Sexual activity: Not Currently    Birth control/protection: None  Other Topics Concern   Not on file  Social History Narrative   Not on file   Social Determinants of Health   Financial Resource Strain: Low Risk  (01/26/2023)   Overall Financial Resource Strain (CARDIA)    Difficulty of Paying Living Expenses: Not hard at all  Food Insecurity: No Food Insecurity (01/26/2023)   Hunger Vital Sign    Worried About Running Out of Food in the Last Year: Never true    Ran Out of Food in the Last Year: Never true  Transportation Needs: No Transportation Needs (01/26/2023)   PRAPARE - Administrator, Civil Service (Medical): No    Lack of Transportation (Non-Medical): No  Physical Activity: Inactive (01/26/2023)   Exercise Vital Sign    Days of Exercise per Week: 0 days    Minutes of Exercise per Session: 0 min  Stress: No Stress Concern Present (01/26/2023)   [employer] of Occupational Health - Occupational Stress Questionnaire    Feeling of Stress : Not at all  Social Connections: Moderately Integrated (01/26/2023)   Social Connection and Isolation Panel [NHANES]    Frequency of Communication with Friends and Family: More than three times a week    Frequency of Social Gatherings with Friends and Family: Once a week    Attends Religious Services: More than 4 times per year    Active Member of [employer] or Organizations: No    Attends Engineer, structural: Never    Marital Status: Married    Tobacco Counseling Counseling given: Not Answered Tobacco comments: Smoking cessation materials  not required   Clinical Intake:  Pre-visit preparation completed: Yes  Pain : No/denies pain     BMI - recorded: 23.78 Nutritional Status: BMI of 19-24  Normal Nutritional Risks: None Diabetes: No  How often do you need to have someone help you when you read instructions, pamphlets, or other written materials from your doctor or pharmacy?: 1 - Never  Interpreter Needed?: No  Comments: lives with husband Information entered by :: B.Joshuwa Vecchio,LPN   Activities of Daily Living    01/26/2023   10:58 AM 12/18/2022   11:28 AM  In your present state of health, do you have any difficulty performing the following activities:  Hearing? 0 0  Vision? 0 0  Difficulty concentrating or making decisions? 0 0  Walking or climbing stairs? 0 0  Dressing or bathing? 0 0  Doing errands, shopping? 0 0  Preparing Food and eating ? N   Using the Toilet? N   In the past six months, have you accidently leaked urine? N   Do you have problems with loss of bowel control? N   Managing your Medications? N   Managing your Finances? N   Housekeeping or managing your Housekeeping? N     Patient Care Team: Margarita Mail, DO as PCP - General (Internal Medicine)  Indicate any recent Medical Services you may have received from other than [facility] providers in the past year (date may be approximate).     Assessment:   This is a routine wellness examination for [facility].  Hearing/Vision screen Hearing Screening - Comments:: Adequate hearing Vision Screening - Comments:: Adequate vision w/glasses [facility]  Dietary issues and exercise activities discussed:     Goals Addressed   None    Depression Screen    01/26/2023   10:54 AM 12/18/2022   11:28 AM 09/19/2022    1:02 PM 06/06/2022   10:22 AM 01/16/2022    2:17 PM 12/26/2021    2:42 PM 10/31/2021    3:36 PM  PHQ 2/9 Scores  PHQ - 2 Score 0 0 0 0 0 0 0  PHQ- 9 Score  0 0 0 0 0 0    Fall Risk    01/26/2023   10:51 AM 12/18/2022   11:25 AM  09/19/2022    1:02 PM 06/06/2022   10:22 AM 01/16/2022    2:16 PM  Fall Risk   Falls in the past year? 0 0 0 1 1  Number falls in past yr: 0 0 0 1 1  Injury with Fall? 0 0 0 1 1  Risk for fall due to : No Fall Risks   Impaired balance/gait;Impaired mobility Impaired balance/gait;Impaired mobility  Follow up Education provided;Falls prevention discussed        MEDICARE RISK AT HOME:  Medicare Risk at Home - 01/26/23 1052     Any stairs in or around the home? No    If so, are there any without handrails? No    Home free of loose throw rugs in walkways, pet beds, electrical cords, etc? Yes    Adequate lighting in your home to reduce risk of falls? Yes    Life alert? No    Use of a cane, walker or w/c? No    Grab bars in the bathroom? No    Shower chair or bench in shower? No    Elevated toilet seat or a handicapped toilet? No             TIMED UP AND GO:  Was the test performed?  No    Cognitive Function:    07/13/2021   10:21 AM  MMSE - Mini Mental State Exam  Orientation to time 5  Orientation to Place 4  Registration 3  Attention/ Calculation 2  Recall 0  Language- name 2 objects 2  Language- repeat 1  Language- follow 3 step command 3  Language- read & follow direction 1  Write a sentence 1  Copy design 0  Total score 22        01/26/2023   11:01 AM  6CIT Screen  What Year? 4 points  What month? 0 points  What time? 0 points  Count back from 20 0 points  Months in reverse 2 points  Repeat phrase 10 points  Total Score 16 points    Immunizations Immunization History  Administered Date(s) Administered   Fluad Quad(high Dose 65+) 06/07/2021   PFIZER(Purple Top)SARS-COV-2 Vaccination 10/01/2019, 10/22/2019, 06/23/2020   PNEUMOCOCCAL CONJUGATE-20 06/06/2022   Pneumococcal Conjugate-13 04/29/2014   Pneumococcal Polysaccharide-23 06/13/2010   Td 06/13/2010    TDAP status: Up to date  Flu Vaccine status: Up to date  Pneumococcal vaccine status: Up  to date  Covid-19 vaccine status: Completed vaccines  Qualifies for Shingles Vaccine? Yes   Zostavax completed No   Shingrix Completed?: No.    Education has been provided regarding the importance of this vaccine. Patient has been advised to call insurance company to determine out of pocket expense if they have not yet received this vaccine. Advised may also receive vaccine at local pharmacy or Health Dept. Verbalized acceptance and understanding.  Screening Tests Health Maintenance  Topic Date Due   Zoster Vaccines- Shingrix (1 of 2) Never done   OPHTHALMOLOGY EXAM  10/23/2018   COVID-19 Vaccine (4 - 2023-24 season) 03/03/2022   INFLUENZA VACCINE  02/01/2023   Diabetic kidney evaluation - Urine ACR  06/07/2023   HEMOGLOBIN A1C  06/19/2023   Diabetic kidney evaluation - eGFR measurement  12/18/2023   FOOT EXAM  12/18/2023   Medicare Annual Wellness (AWV)  01/26/2024   Pneumonia Vaccine 75+ Years old  Completed   DEXA SCAN  Completed   HPV VACCINES  Aged Out   DTaP/Tdap/Td  Discontinued    Health Maintenance  Health Maintenance Due  Topic Date Due   Zoster Vaccines- Shingrix (1 of 2) Never done   OPHTHALMOLOGY EXAM  10/23/2018   COVID-19 Vaccine (4 - 2023-24 season) 03/03/2022    Colorectal cancer screening: No longer required.   Mammogram status: No longer required due to age.  Bone Density status: Completed yes. Results reflect: Bone density results: NORMAL. Repeat every 5 years.  Lung Cancer Screening: (Low Dose CT Chest recommended if Age 42-80 years, 20 pack-year currently smoking OR have quit w/in 15years.) does not qualify.   Lung Cancer Screening Referral: no   Additional Screening:  Hepatitis C Screening: does not qualify; Completed yes  Vision Screening: Recommended annual ophthalmology exams for early detection of glaucoma and other disorders of the eye. Is the patient up to date with their annual eye exam?  Yes  Who is the provider or what is the name of  the office in which the patient attends annual eye exams? [facility] If pt is not established with a provider, would they like to be referred to a provider to establish care? No .   Dental Screening: Recommended annual dental exams for proper oral hygiene  Diabetic Foot Exam: Diabetic Foot Exam: Completed yes  Community Resource Referral / Chronic Care Management: CRR required this visit?  No   CCM required this visit?  No     Plan:     I have personally reviewed and noted the following in the patient's chart:   Medical and social history Use of alcohol, tobacco or illicit drugs  Current medications and supplements including opioid prescriptions. Patient is not currently taking opioid prescriptions. Functional ability and status Nutritional status Physical activity Advanced directives List of other physicians Hospitalizations, surgeries, and ER visits in previous 12 months Vitals Screenings to include cognitive, depression, and falls Referrals and appointments  In addition, I have reviewed and discussed with patient certain preventive protocols, quality metrics, and best practice  recommendations. A written personalized care plan for preventive services as well as general preventive health recommendations were provided to patient.     Sue Lush, LPN   1/61/0960   After Visit Summary: (MyChart) Due to this being a telephonic visit, the after visit summary with patients personalized plan was offered to patient via MyChart   Nurse Notes: The patient states she is doing well and has no concerns or questions at this time.

## 2023-01-26 NOTE — Patient Instructions (Signed)
[name] , Thank you for taking time to come for your Medicare Wellness Visit. I appreciate your ongoing commitment to your health goals. Please review the following plan we discussed and let me know if I can assist you in the future.   Referrals/Orders/Follow-Ups/Clinician Recommendations: none  This is a list of the screening recommended for you and due dates:  Health Maintenance  Topic Date Due   Zoster (Shingles) Vaccine (1 of 2) Never done   Eye exam for diabetics  10/23/2018   COVID-19 Vaccine (4 - 2023-24 season) 03/03/2022   Flu Shot  02/01/2023   Yearly kidney health urinalysis for diabetes  06/07/2023   Hemoglobin A1C  06/19/2023   Yearly kidney function blood test for diabetes  12/18/2023   Complete foot exam   12/18/2023   Medicare Annual Wellness Visit  01/26/2024   Pneumonia Vaccine  Completed   DEXA scan (bone density measurement)  Completed   HPV Vaccine  Aged Out   DTaP/Tdap/Td vaccine  Discontinued    Advanced directives: (In Chart) A copy of your advanced directives are scanned into your chart should your provider ever need it.  Next Medicare Annual Wellness Visit scheduled for next year: Yes  02/01/2024   Preventive Care 65 Years and Older, Female Preventive care refers to lifestyle choices and visits with your health care provider that can promote health and wellness. What does preventive care include? A yearly physical exam. This is also called an annual well check. Dental exams once or twice a year. Routine eye exams. Ask your health care provider how often you should have your eyes checked. Personal lifestyle choices, including: Daily care of your teeth and gums. Regular physical activity. Eating a healthy diet. Avoiding tobacco and drug use. Limiting alcohol use. Practicing safe sex. Taking low-dose aspirin every day. Taking vitamin and mineral supplements as recommended by your health care provider. What happens during an annual well check? The  services and screenings done by your health care provider during your annual well check will depend on your age, overall health, lifestyle risk factors, and family history of disease. Counseling  Your health care provider may ask you questions about your: Alcohol use. Tobacco use. Drug use. Emotional well-being. Home and relationship well-being. Sexual activity. Eating habits. History of falls. Memory and ability to understand (cognition). Work and work Astronomer. Reproductive health. Screening  You may have the following tests or measurements: Height, weight, and BMI. Blood pressure. Lipid and cholesterol levels. These may be checked every 5 years, or more frequently if you are over 23 years old. Skin check. Lung cancer screening. You may have this screening every year starting at age 26 if you have a 30-pack-year history of smoking and currently smoke or have quit within the past 15 years. Fecal occult blood test (FOBT) of the stool. You may have this test every year starting at age 5. Flexible sigmoidoscopy or colonoscopy. You may have a sigmoidoscopy every 5 years or a colonoscopy every 10 years starting at age 96. Hepatitis C blood test. Hepatitis B blood test. Sexually transmitted disease (STD) testing. Diabetes screening. This is done by checking your blood sugar (glucose) after you have not eaten for a while (fasting). You may have this done every 1-3 years. Bone density scan. This is done to screen for osteoporosis. You may have this done starting at age 27. Mammogram. This may be done every 1-2 years. Talk to your health care provider about how often you should have regular mammograms.  Talk with your health care provider about your test results, treatment options, and if necessary, the need for more tests. Vaccines  Your health care provider may recommend certain vaccines, such as: Influenza vaccine. This is recommended every year. Tetanus, diphtheria, and acellular  pertussis (Tdap, Td) vaccine. You may need a Td booster every 10 years. Zoster vaccine. You may need this after age 35. Pneumococcal 13-valent conjugate (PCV13) vaccine. One dose is recommended after age 20. Pneumococcal polysaccharide (PPSV23) vaccine. One dose is recommended after age 10. Talk to your health care provider about which screenings and vaccines you need and how often you need them. This information is not intended to replace advice given to you by your health care provider. Make sure you discuss any questions you have with your health care provider. Document Released: 07/16/2015 Document Revised: 03/08/2016 Document Reviewed: 04/20/2015 Elsevier Interactive Patient Education  2017 [employer].  Fall Prevention in the Home Falls can cause injuries. They can happen to people of all ages. There are many things you can do to make your home safe and to help prevent falls. What can I do on the outside of my home? Regularly fix the edges of walkways and driveways and fix any cracks. Remove anything that might make you trip as you walk through a door, such as a raised step or threshold. Trim any bushes or trees on the path to your home. Use bright outdoor lighting. Clear any walking paths of anything that might make someone trip, such as rocks or tools. Regularly check to see if handrails are loose or broken. Make sure that both sides of any steps have handrails. Any raised decks and porches should have guardrails on the edges. Have any leaves, snow, or ice cleared regularly. Use sand or salt on walking paths during winter. Clean up any spills in your garage right away. This includes oil or grease spills. What can I do in the bathroom? Use night lights. Install grab bars by the toilet and in the tub and shower. Do not use towel bars as grab bars. Use non-skid mats or decals in the tub or shower. If you need to sit down in the shower, use a plastic, non-slip stool. Keep the floor  dry. Clean up any water that spills on the floor as soon as it happens. Remove soap buildup in the tub or shower regularly. Attach bath mats securely with double-sided non-slip rug tape. Do not have throw rugs and other things on the floor that can make you trip. What can I do in the bedroom? Use night lights. Make sure that you have a light by your bed that is easy to reach. Do not use any sheets or blankets that are too big for your bed. They should not hang down onto the floor. Have a firm chair that has side arms. You can use this for support while you get dressed. Do not have throw rugs and other things on the floor that can make you trip. What can I do in the kitchen? Clean up any spills right away. Avoid walking on wet floors. Keep items that you use a lot in easy-to-reach places. If you need to reach something above you, use a strong step stool that has a grab bar. Keep electrical cords out of the way. Do not use floor polish or wax that makes floors slippery. If you must use wax, use non-skid floor wax. Do not have throw rugs and other things on the floor that can make  you trip. What can I do with my stairs? Do not leave any items on the stairs. Make sure that there are handrails on both sides of the stairs and use them. Fix handrails that are broken or loose. Make sure that handrails are as long as the stairways. Check any carpeting to make sure that it is firmly attached to the stairs. Fix any carpet that is loose or worn. Avoid having throw rugs at the top or bottom of the stairs. If you do have throw rugs, attach them to the floor with carpet tape. Make sure that you have a light switch at the top of the stairs and the bottom of the stairs. If you do not have them, ask someone to add them for you. What else can I do to help prevent falls? Wear shoes that: Do not have high heels. Have rubber bottoms. Are comfortable and fit you well. Are closed at the toe. Do not wear  sandals. If you use a stepladder: Make sure that it is fully opened. Do not climb a closed stepladder. Make sure that both sides of the stepladder are locked into place. Ask someone to hold it for you, if possible. Clearly mark and make sure that you can see: Any grab bars or handrails. First and last steps. Where the edge of each step is. Use tools that help you move around (mobility aids) if they are needed. These include: Canes. Walkers. Scooters. Crutches. Turn on the lights when you go into a dark area. Replace any light bulbs as soon as they burn out. Set up your furniture so you have a clear path. Avoid moving your furniture around. If any of your floors are uneven, fix them. If there are any pets around you, be aware of where they are. Review your medicines with your doctor. Some medicines can make you feel dizzy. This can increase your chance of falling. Ask your doctor what other things that you can do to help prevent falls. This information is not intended to replace advice given to you by your health care provider. Make sure you discuss any questions you have with your health care provider. Document Released: 04/15/2009 Document Revised: 11/25/2015 Document Reviewed: 07/24/2014 Elsevier Interactive Patient Education  2017 [employer].

## 2023-06-19 NOTE — Progress Notes (Unsigned)
   Established Patient Office Visit  Subjective   Patient ID: Angela Horton, female    DOB: 06/18/1941  Age: 82 y.o. MRN: [id number]  No chief complaint on file.   HPI  Patient is here for follow up on chronic medical conditions.   Unintentional weight Loss: -Had lost a significant amount of weight in a short amount of time back in early 2023, since then has gained some of the weight back. Appetite stable. Denies abdominal pain, nausea, vomiting or changes in bowel habits.  -Still drinks Ensure -Currently on Protonix 40 mg BID  Diabetes, Type 2: -Last A1c 7.3% 6/24 -Medications: Nothing currently  -Patient is compliant with the above medications and reports no side effects.  -Eye exam: Due has an ophthalmologist she follows with -Foot exam: UTD 6/24 -Microalbumin: UTD 12/23, elevated -Statin: Yes -PNA vaccine: UTD  HLD: -Medications: Crestor 20 mg -Patient is compliant with above medications and reports no side effects.  -Last lipid panel: Lipid Panel     Component Value Date/Time   CHOL 127 12/18/2022 1210   CHOL 198 01/07/2015 1117   TRIG 184 (H) 12/18/2022 1210   HDL 38 (L) 12/18/2022 1210   HDL 39 (L) 01/07/2015 1117   CHOLHDL 3.3 12/18/2022 1210   VLDL 19 02/15/2017 0901   LDLCALC 63 12/18/2022 1210   LABVLDL 20 01/07/2015 1117      {History (Optional):23778}  ROS    Objective:     There were no vitals taken for this visit. {Vitals History (Optional):23777}  Physical Exam   No results found for any visits on 06/20/23.  {Labs (Optional):23779}  The ASCVD Risk score (Arnett DK, et al., 2019) failed to calculate for the following reasons:   The 2019 ASCVD risk score is only valid for ages 40 to 24   Risk score cannot be calculated because patient has a medical history suggesting prior/existing ASCVD    Assessment & Plan:  There are no diagnoses linked to this encounter.   No follow-ups on file.    Margarita Mail, DO

## 2023-06-20 ENCOUNTER — Encounter: Payer: Self-pay | Admitting: Internal Medicine

## 2023-06-20 ENCOUNTER — Other Ambulatory Visit: Payer: Self-pay

## 2023-06-20 ENCOUNTER — Ambulatory Visit (INDEPENDENT_AMBULATORY_CARE_PROVIDER_SITE_OTHER): Payer: Medicare HMO | Admitting: Internal Medicine

## 2023-06-20 VITALS — BP 134/78 | HR 68 | Temp 98.5°F | Resp 14 | Ht 68.0 in | Wt 166.3 lb

## 2023-06-20 DIAGNOSIS — E785 Hyperlipidemia, unspecified: Secondary | ICD-10-CM

## 2023-06-20 DIAGNOSIS — E1165 Type 2 diabetes mellitus with hyperglycemia: Secondary | ICD-10-CM

## 2023-06-20 DIAGNOSIS — K219 Gastro-esophageal reflux disease without esophagitis: Secondary | ICD-10-CM

## 2023-06-20 LAB — POCT GLYCOSYLATED HEMOGLOBIN (HGB A1C): Hemoglobin A1C: 7.6 % — AB (ref 4.0–5.6)

## 2023-06-20 MED ORDER — METFORMIN HCL ER 500 MG PO TB24
500.0000 mg | ORAL_TABLET | Freq: Every day | ORAL | 0 refills | Status: DC
Start: 2023-06-20 — End: 2023-09-12

## 2023-06-20 NOTE — Assessment & Plan Note (Addendum)
A1c increased to 7.6%, will try Metformin 500 mg XR once daily. Patient has a noted allergy to Metformin which was documented to cause stomach pain but patient does not remember, will try and if she cannot tolerate we will discontinue. Stop Ensure as well. Recheck in 3 months.

## 2023-06-20 NOTE — Assessment & Plan Note (Addendum)
Stable, decrease Protonix to 20 mg daily.

## 2023-06-20 NOTE — Assessment & Plan Note (Signed)
Stable, on a statin.

## 2023-06-20 NOTE — Patient Instructions (Addendum)
It was great seeing you today!  Plan discussed at today's visit: -A1c 7.6% which is a little higher than it was last time -Cut out Ensure and start new medication Metformin 500 mg once a day with breakfast -Cut Pantoprazole in half (this is for acid reflux symptoms) -Continue Rosuvastatin like normal   Follow up in: 3 months   Take care and let us know if you have any questions or concerns prior to your next visit.  Dr. Caralee Ates

## 2023-06-21 LAB — MICROALBUMIN / CREATININE URINE RATIO
Creatinine, Urine: 195 mg/dL (ref 20–275)
Microalb Creat Ratio: 34 mg/g{creat} — ABNORMAL HIGH (ref <30)
Microalb, Ur: 6.7 mg/dL

## 2023-06-28 DIAGNOSIS — G4729 Other circadian rhythm sleep disorder: Secondary | ICD-10-CM | POA: Diagnosis not present

## 2023-07-06 ENCOUNTER — Encounter: Payer: Self-pay | Admitting: Internal Medicine

## 2023-09-12 ENCOUNTER — Other Ambulatory Visit: Payer: Self-pay | Admitting: Internal Medicine

## 2023-09-12 DIAGNOSIS — E1165 Type 2 diabetes mellitus with hyperglycemia: Secondary | ICD-10-CM

## 2023-09-12 NOTE — Telephone Encounter (Signed)
 Copied from CRM [id number]. Topic: Clinical - Medication Refill >> Sep 12, 2023  3:43 PM Gildardo Pounds wrote: Most Recent Primary Care Visit:  Provider: Margarita Mail  Department: [facility]  Visit Type: OFFICE VISIT  Date: 06/20/2023  Medication: metFORMIN (GLUCOPHAGE-XR) 500 MG 24 hr tablet  Has the patient contacted their pharmacy? No (Agent: If no, request that the patient contact the pharmacy for the refill. If patient does not wish to contact the pharmacy document the reason why and proceed with request.) (Agent: If yes, when and what did the pharmacy advise?)  Is this the correct pharmacy for this prescription? Yes If no, delete pharmacy and type the correct one.  This is the patient's preferred pharmacy:  [id number] [place], [place] - [address] [place] [address] Phone: [phone] Fax: [phone]     Has the prescription been filled recently? No  Is the patient out of the medication? No  Has the patient been seen for an appointment in the last year OR does the patient have an upcoming appointment? Yes  Can we respond through MyChart? No  Agent: Please be advised that Rx refills may take up to 3 business days. We ask that you follow-up with your pharmacy.

## 2023-09-13 DIAGNOSIS — M19041 Primary osteoarthritis, right hand: Secondary | ICD-10-CM | POA: Diagnosis not present

## 2023-09-13 DIAGNOSIS — M19042 Primary osteoarthritis, left hand: Secondary | ICD-10-CM | POA: Diagnosis not present

## 2023-09-13 MED ORDER — METFORMIN HCL ER 500 MG PO TB24: 500.0000 mg | ORAL_TABLET | Freq: Every day | ORAL | 0 refills | Status: DC

## 2023-09-13 NOTE — Telephone Encounter (Signed)
 Requested Prescriptions  Refused Prescriptions Disp Refills   metFORMIN (GLUCOPHAGE-XR) 500 MG 24 hr tablet [Pharmacy Med Name: METFORMIN HCL ER 500 MG TABLET] 90 tablet 0    Sig: TAKE 1 TABLET BY MOUTH EVERY DAY WITH BREAKFAST     Endocrinology:  Diabetes - Biguanides Failed - 09/13/2023 10:25 AM      Failed - Cr in normal range and within 360 days    Creat  Date Value Ref Range Status  12/18/2022 1.11 (H) 0.60 - 0.95 mg/dL Final   Creatinine, Urine  Date Value Ref Range Status  06/20/2023 195 20 - 275 mg/dL Final         Failed - eGFR in normal range and within 360 days    GFR, Est African American  Date Value Ref Range Status  07/27/2020 64 > OR = 60 mL/min/1.69m2 Final   GFR, Est Non African American  Date Value Ref Range Status  07/27/2020 55 (L) > OR = 60 mL/min/1.20m2 Final   GFR, Estimated  Date Value Ref Range Status  10/27/2021 56 (L) >60 mL/min Final    Comment:    (NOTE) Calculated using the CKD-EPI Creatinine Equation (2021)    eGFR  Date Value Ref Range Status  12/18/2022 49 (L) > OR = 60 mL/min/1.55m2 Final         Failed - B12 Level in normal range and within 720 days    Vitamin B-12  Date Value Ref Range Status  12/18/2022 1,135 (H) 200 - 1,100 pg/mL Final         Passed - HBA1C is between 0 and 7.9 and within 180 days    Hemoglobin A1C  Date Value Ref Range Status  06/20/2023 7.6 (A) 4.0 - 5.6 % Final   Hgb A1c MFr Bld  Date Value Ref Range Status  12/18/2022 7.3 (H) <5.7 % of total Hgb Final    Comment:    For someone without known diabetes, a hemoglobin A1c value of 6.5% or greater indicates that they may have  diabetes and this should be confirmed with a follow-up  test. . For someone with known diabetes, a value <7% indicates  that their diabetes is well controlled and a value  greater than or equal to 7% indicates suboptimal  control. A1c targets should be individualized based on  duration of diabetes, age, comorbid conditions, and   other considerations. . Currently, no consensus exists regarding use of hemoglobin A1c for diagnosis of diabetes for children. Verna Czech - Valid encounter within last 6 months    Recent Outpatient Visits           2 months ago Type 2 diabetes mellitus with hyperglycemia, without long-term current use of insulin [facility])   [facility] Margarita Mail, DO   8 months ago Type 2 diabetes mellitus with hyperglycemia, without long-term current use of insulin [facility])   [facility] Margarita Mail, DO   11 months ago Non-recurrent acute serous otitis media of right ear   [facility] Margarita Mail, DO   1 year ago Type 2 diabetes mellitus with hyperglycemia, without long-term current use of insulin [facility])    [facility] Margarita Mail, DO   1 year ago Encounter for [employer] annual wellness exam   [facility] Margarita Mail, DO       Future Appointments  In 1 month Margarita Mail, DO [facility]            Passed - CBC within normal limits and completed in the last 12 months    WBC  Date Value Ref Range Status  12/18/2022 5.0 3.8 - 10.8 Thousand/uL Final   RBC  Date Value Ref Range Status  12/18/2022 3.76 (L) 3.80 - 5.10 Million/uL Final   Hemoglobin  Date Value Ref Range Status  12/18/2022 10.9 (L) 11.7 - 15.5 g/dL Final   HGB  Date Value Ref Range Status  06/15/2014 10.3 (L) 12.0 - 16.0 g/dL Final   HCT  Date Value Ref Range Status  12/18/2022 33.4 (L) 35.0 - 45.0 % Final  06/15/2014 32.0 (L) 35.0 - 47.0 % Final   MCHC  Date Value Ref Range Status  12/18/2022 32.6 32.0 - 36.0 g/dL Final   [facility]  Date Value Ref Range Status  12/18/2022 29.0 27.0 - 33.0 pg Final   MCV  Date Value Ref Range Status  12/18/2022 88.8 80.0 - 100.0 fL Final  06/15/2014 88 80 - 100 fL  Final   No results found for: "PLTCOUNTKUC", "LABPLAT", "POCPLA" RDW  Date Value Ref Range Status  12/18/2022 12.3 11.0 - 15.0 % Final  06/15/2014 14.7 (H) 11.5 - 14.5 % Final

## 2023-09-18 ENCOUNTER — Ambulatory Visit: Payer: Self-pay | Admitting: Internal Medicine

## 2023-10-16 ENCOUNTER — Encounter: Payer: Self-pay | Admitting: Internal Medicine

## 2023-10-16 ENCOUNTER — Ambulatory Visit: Admitting: Internal Medicine

## 2023-10-16 ENCOUNTER — Other Ambulatory Visit: Payer: Self-pay

## 2023-10-16 VITALS — BP 132/78 | HR 68 | Temp 98.1°F | Resp 14 | Ht 68.0 in | Wt 151.8 lb

## 2023-10-16 DIAGNOSIS — D649 Anemia, unspecified: Secondary | ICD-10-CM

## 2023-10-16 DIAGNOSIS — N1831 Chronic kidney disease, stage 3a: Secondary | ICD-10-CM | POA: Diagnosis not present

## 2023-10-16 DIAGNOSIS — E1165 Type 2 diabetes mellitus with hyperglycemia: Secondary | ICD-10-CM

## 2023-10-16 DIAGNOSIS — Z7984 Long term (current) use of oral hypoglycemic drugs: Secondary | ICD-10-CM

## 2023-10-16 DIAGNOSIS — E782 Mixed hyperlipidemia: Secondary | ICD-10-CM | POA: Diagnosis not present

## 2023-10-16 DIAGNOSIS — I10 Essential (primary) hypertension: Secondary | ICD-10-CM

## 2023-10-16 DIAGNOSIS — K219 Gastro-esophageal reflux disease without esophagitis: Secondary | ICD-10-CM

## 2023-10-16 MED ORDER — PANTOPRAZOLE SODIUM 20 MG PO TBEC: 20.0000 mg | DELAYED_RELEASE_TABLET | Freq: Every day | ORAL | 1 refills | Status: DC

## 2023-10-16 MED ORDER — ROSUVASTATIN CALCIUM 20 MG PO TABS: 20.0000 mg | ORAL_TABLET | Freq: Every day | ORAL | 3 refills | Status: AC

## 2023-10-16 MED ORDER — METFORMIN HCL ER 500 MG PO TB24: 500.0000 mg | ORAL_TABLET | Freq: Every day | ORAL | 1 refills | Status: DC

## 2023-10-16 NOTE — Progress Notes (Signed)
 Established Patient Office Visit  Subjective   Patient ID: Angela Horton, female    DOB: June 19, 1941  Age: 83 y.o. MRN: [id number]  Chief Complaint  Patient presents with   Medical Management of Chronic Issues    3 month follow up    HPI  Patient is here for follow up on chronic medical conditions. She is here with her husband today and states she is doing well.   Unintentional weight Loss: -Had lost a significant amount of weight in a short amount of time back in early 2023, since then has gained some of the weight back but today was down another 15 pounds from December.Marland Kitchen Appetite stable, states she is purposefully losing weight but still has a good appetite and eating oatmeal for breakfast. Denies abdominal pain, nausea, vomiting or changes in bowel habits.  -Currently on Protonix 20 mg daily  Diabetes, Type 2: -Last A1c 7.6% 12/24 -Medications: Metformin 500 mg XR started at LOV -Patient is compliant with the above medications and reports no side effects.  -Eye exam: Due has an ophthalmologist she follows with -Foot exam: UTD 6/24 -Microalbumin: UTD -Statin: Yes -PNA vaccine: UTD  HLD: -Medications: Crestor 20 mg -Patient is compliant with above medications and reports no side effects.  -Last lipid panel: Lipid Panel     Component Value Date/Time   CHOL 127 12/18/2022 1210   CHOL 198 01/07/2015 1117   TRIG 184 (H) 12/18/2022 1210   HDL 38 (L) 12/18/2022 1210   HDL 39 (L) 01/07/2015 1117   CHOLHDL 3.3 12/18/2022 1210   VLDL 19 02/15/2017 0901   LDLCALC 63 12/18/2022 1210   LABVLDL 20 01/07/2015 1117   Health Maintenance: -Blood work due   Patient Active Problem List   Diagnosis Date Noted   Type 2 diabetes mellitus with hyperglycemia, without long-term current use of insulin (HCC) 06/20/2023   Gastroesophageal reflux disease 06/20/2023   Common bile duct stone    Dizziness 09/08/2021   Chronic kidney disease, stage 3a (HCC) 09/08/2021   Fall at home,  initial encounter 09/08/2021   Hypokalemia 09/08/2021   Abnormal LFTs 09/08/2021   Elevated lipase 09/08/2021   Protein-calorie malnutrition, moderate (HCC) 09/08/2021   Weight loss, unintentional 09/08/2021   Malnutrition, unspecified type (HCC) 08/11/2021   Lightheadedness 07/13/2021   Mild cognitive impairment of uncertain or unknown etiology 07/13/2021   CKD (chronic kidney disease) stage 2, GFR 60-89 ml/min 07/26/2018   Premature atrial contractions 10/12/2015   Essential hypertension 01/07/2015   Hyperlipidemia 01/07/2015   Type 2 diabetes mellitus with other specified complication, without long-term current use of insulin (HCC)    Past Medical History:  Diagnosis Date   Allergy    Allergic to Amlodipine   Anemia    Depression 06/26/2021   Diabetes mellitus without complication (HCC)    GERD (gastroesophageal reflux disease)    Gout    Hyperlipidemia    Hypertension    Myocardial infarction (HCC)    Obesity    Stroke (HCC)    Ulcer    Past Surgical History:  Procedure Laterality Date   ABDOMINAL HYSTERECTOMY     COLONOSCOPY WITH PROPOFOL N/A 09/11/2021   Procedure: COLONOSCOPY WITH PROPOFOL;  Surgeon: Toney Reil, MD;  Location: [facility] ENDOSCOPY;  Service: Gastroenterology;  Laterality: N/A;   ERCP N/A 10/27/2021   Procedure: ENDOSCOPIC RETROGRADE CHOLANGIOPANCREATOGRAPHY (ERCP);  Surgeon: Midge Minium, MD;  Location[facility] END[facility]COPY;  Service: Endoscopy;  Laterality: N/A;   ESOPHAGOGASTRODUODENOSCOPY (EGD) WITH PROPOFOL N/A 09/11/2021  Procedure: ESOPHAGOGASTRODUODENOSCOPY (EGD) WITH PROPOFOL;  Surgeon: Toney Reil, MD;  Location: [facility] ENDOSCOPY;  Service: Gastroenterology;  Laterality: N/A;   EUS N/A 09/29/2021   Procedure: UPPER ENDOSCOPIC ULTRASOUND (EUS) LINEAR;  Surgeon: Bearl Mulberry, MD;  Location: [facility] ENDOSCOPY;  Service: Gastroenterology;  Laterality: N/A;   TUBAL LIGATION     Social History   Tobacco Use   Smoking status: Never    Smokeless tobacco: Never   Tobacco comments:    Smoking cessation materials not required  Vaping Use   Vaping status: Never Used  Substance Use Topics   Alcohol use: No   Drug use: No   Social History   Socioeconomic History   Marital status: Married    Spouse name: Not on file   Number of children: 4   Years of education: Not on file   Highest education level: 12th grade  Occupational History   Not on file  Tobacco Use   Smoking status: Never   Smokeless tobacco: Never   Tobacco comments:    Smoking cessation materials not required  Vaping Use   Vaping status: Never Used  Substance and Sexual Activity   Alcohol use: No   Drug use: No   Sexual activity: Not Currently    Birth control/protection: None  Other Topics Concern   Not on file  Social History Narrative   Not on file   Social Drivers of Health   Financial Resource Strain: Low Risk  (06/18/2023)   Overall Financial Resource Strain (CARDIA)    Difficulty of Paying Living Expenses: Not hard at all  Food Insecurity: No Food Insecurity (06/18/2023)   Hunger Vital Sign    Worried About Running Out of Food in the Last Year: Never true    Ran Out of Food in the Last Year: Never true  Transportation Needs: No Transportation Needs (06/18/2023)   PRAPARE - Administrator, Civil Service (Medical): No    Lack of Transportation (Non-Medical): No  Physical Activity: Inactive (06/18/2023)   Exercise Vital Sign    Days of Exercise per Week: 0 days    Minutes of Exercise per Session: 0 min  Stress: No Stress Concern Present (06/18/2023)   [employer] of Occupational Health - Occupational Stress Questionnaire    Feeling of Stress : Not at all  Social Connections: Moderately Integrated (06/18/2023)   Social Connection and Isolation Panel [NHANES]    Frequency of Communication with Friends and Family: More than three times a week    Frequency of Social Gatherings with Friends and Family: Once a week     Attends Religious Services: More than 4 times per year    Active Member of [employer] or Organizations: No    Attends Banker Meetings: Never    Marital Status: Married  Catering manager Violence: Not At Risk (01/26/2023)   Humiliation, Afraid, Rape, and Kick questionnaire    Fear of Current or Ex-Partner: No    Emotionally Abused: No    Physically Abused: No    Sexually Abused: No   Family Status  Relation Name Status   Mother Gerre Couch Deceased       kidney disease   Father  Deceased       old age   Sister  Deceased       kidney diease   Brother  Deceased       stroke   Sister Limmie Patricia Deceased       heart disease/stroke  Daughter  Alive   Son  Alive   MGM  Deceased       unknown   MGF  Deceased       unknown   PGM  Deceased       old age   PGF  Deceased       unknown   Brother  Deceased       MVA   Daughter  Alive   Son  Alive   Sister Marchelle Sessions (Not Specified)  No partnership data on file   Family History  Problem Relation Age of Onset   Kidney disease Mother    Kidney disease Sister    Stroke Brother    Heart disease Sister    Stroke Sister    Stroke Sister    Allergies  Allergen Reactions   Metformin And Related     Upset stomach headaches      Review of Systems  All other systems reviewed and are negative.     Objective:     BP 132/78   Pulse 68   Temp 98.1 F (36.7 C) (Oral)   Resp 14   Ht 5\' 8"  (1.727 m)   Wt 151 lb 12.8 oz (68.9 kg)   SpO2 96%   BMI 23.08 kg/m  BP Readings from Last 3 Encounters:  10/16/23 132/78  06/20/23 134/78  12/18/22 138/84   Wt Readings from Last 3 Encounters:  10/16/23 151 lb 12.8 oz (68.9 kg)  06/20/23 166 lb 4.8 oz (75.4 kg)  01/26/23 161 lb (73 kg)      Physical Exam Constitutional:      Appearance: Normal appearance.  HENT:     Head: Normocephalic and atraumatic.     Mouth/Throat:     Mouth: Mucous membranes are moist.     Pharynx: Oropharynx is clear.  Eyes:      Extraocular Movements: Extraocular movements intact.     Conjunctiva/sclera: Conjunctivae normal.     Pupils: Pupils are equal, round, and reactive to light.  Cardiovascular:     Rate and Rhythm: Normal rate and regular rhythm.  Pulmonary:     Effort: Pulmonary effort is normal.     Breath sounds: Normal breath sounds.  Musculoskeletal:     Right lower leg: No edema.     Left lower leg: No edema.  Skin:    General: Skin is warm and dry.  Neurological:     General: No focal deficit present.     Mental Status: She is alert. Mental status is at baseline.  Psychiatric:        Mood and Affect: Mood normal.        Behavior: Behavior normal.      No results found for any visits on 10/16/23.   Last CBC Lab Results  Component Value Date   WBC 5.0 12/18/2022   HGB 10.9 (L) 12/18/2022   HCT 33.4 (L) 12/18/2022   MCV 88.8 12/18/2022   MCH 29.0 12/18/2022   RDW 12.3 12/18/2022   PLT 253 12/18/2022   Last metabolic panel Lab Results  Component Value Date   GLUCOSE 125 (H) 12/18/2022   NA 142 12/18/2022   K 4.7 12/18/2022   CL 102 12/18/2022   CO2 31 12/18/2022   BUN 21 12/18/2022   CREATININE 1.11 (H) 12/18/2022   EGFR 49 (L) 12/18/2022   CALCIUM 10.0 12/18/2022   PROT 8.0 12/18/2022   ALBUMIN 3.1 (L) 09/08/2021   LABGLOB 3.6 01/07/2015   AGRATIO 1.2  01/07/2015   BILITOT 0.5 12/18/2022   ALKPHOS 31 (L) 09/08/2021   AST 48 (H) 12/18/2022   ALT 21 12/18/2022   ANIONGAP 11 10/27/2021   Last lipids Lab Results  Component Value Date   CHOL 127 12/18/2022   HDL 38 (L) 12/18/2022   LDLCALC 63 12/18/2022   TRIG 184 (H) 12/18/2022   CHOLHDL 3.3 12/18/2022   Last hemoglobin A1c Lab Results  Component Value Date   HGBA1C 7.6 (A) 06/20/2023   Last thyroid functions Lab Results  Component Value Date   TSH 2.900 09/09/2021   Last vitamin D No results found for: "25OHVITD2", "25OHVITD3", "VD25OH" Last vitamin B12 and Folate Lab Results  Component Value Date    VITAMINB12 1,135 (H) 12/18/2022   FOLATE >24.0 12/18/2022      The ASCVD Risk score (Arnett DK, et al., 2019) failed to calculate for the following reasons:   The 2019 ASCVD risk score is only valid for ages 50 to 44   Risk score cannot be calculated because patient has a medical history suggesting prior/existing ASCVD    Assessment & Plan:  Essential hypertension Assessment & Plan: Blood pressure stable, no longer on medications but due for labs. Continue to monitor.   Orders: -     CBC with Differential/Platelet -     COMPLETE METABOLIC PANEL WITHOUT GFR  Mixed hyperlipidemia Assessment & Plan: Recheck labs, continue and refill statin.   Orders: -     Lipid panel -     Rosuvastatin Calcium; Take 1 tablet (20 mg total) by mouth daily.  Dispense: 90 tablet; Refill: 3  Type 2 diabetes mellitus with hyperglycemia, without long-term current use of insulin (HCC) Assessment & Plan: Doing well on the Metformin, denies side effects. Recheck A1c today and refill Metformin.   Orders: -     COMPLETE METABOLIC PANEL WITHOUT GFR -     Hemoglobin A1c -     metFORMIN HCl ER; Take 1 tablet (500 mg total) by mouth daily with breakfast.  Dispense: 90 tablet; Refill: 1  Chronic kidney disease, stage 3a (HCC) Assessment & Plan: Recheck kidney function today.   Orders: -     COMPLETE METABOLIC PANEL WITHOUT GFR  Gastroesophageal reflux disease, unspecified whether esophagitis present Assessment & Plan: Stable, although lost some more weight, states it was intentional. Will obtain labs today and recheck in 3 months.  Orders: -     Pantoprazole Sodium; Take 1 tablet (20 mg total) by mouth daily.  Dispense: 90 tablet; Refill: 1      Return in about 3 months (around 01/15/2024).    [name], DO

## 2023-10-16 NOTE — Assessment & Plan Note (Signed)
 Recheck labs, continue and refill statin.

## 2023-10-16 NOTE — Assessment & Plan Note (Signed)
 Blood pressure stable, no longer on medications but due for labs. Continue to monitor.

## 2023-10-16 NOTE — Assessment & Plan Note (Signed)
Recheck kidney function today.

## 2023-10-16 NOTE — Assessment & Plan Note (Signed)
 Doing well on the Metformin, denies side effects. Recheck A1c today and refill Metformin.

## 2023-10-16 NOTE — Assessment & Plan Note (Signed)
 Stable, although lost some more weight, states it was intentional. Will obtain labs today and recheck in 3 months.

## 2023-10-17 NOTE — Addendum Note (Signed)
 Addended by: [name] on: 10/17/2023 07:59 AM   Modules accepted: Orders

## 2023-10-19 LAB — CBC WITH DIFFERENTIAL/PLATELET
Absolute Lymphocytes: 2146 {cells}/uL (ref 850–3900)
Absolute Monocytes: 387 {cells}/uL (ref 200–950)
Basophils Absolute: 9 {cells}/uL (ref 0–200)
Basophils Relative: 0.2 %
Eosinophils Absolute: 52 {cells}/uL (ref 15–500)
Eosinophils Relative: 1.2 %
HCT: 33.5 % — ABNORMAL LOW (ref 35.0–45.0)
Hemoglobin: 10.7 g/dL — ABNORMAL LOW (ref 11.7–15.5)
MCH: 28.4 pg (ref 27.0–33.0)
MCHC: 31.9 g/dL — ABNORMAL LOW (ref 32.0–36.0)
MCV: 88.9 fL (ref 80.0–100.0)
MPV: 12.3 fL (ref 7.5–12.5)
Monocytes Relative: 9 %
Neutro Abs: 1707 {cells}/uL (ref 1500–7800)
Neutrophils Relative %: 39.7 %
Platelets: 246 10*3/uL (ref 140–400)
RBC: 3.77 10*6/uL — ABNORMAL LOW (ref 3.80–5.10)
RDW: 12.4 % (ref 11.0–15.0)
Total Lymphocyte: 49.9 %
WBC: 4.3 10*3/uL (ref 3.8–10.8)

## 2023-10-19 LAB — COMPLETE METABOLIC PANEL WITHOUT GFR
AG Ratio: 1.1 (calc) (ref 1.0–2.5)
ALT: 25 U/L (ref 6–29)
AST: 85 U/L — ABNORMAL HIGH (ref 10–35)
Albumin: 3.9 g/dL (ref 3.6–5.1)
Alkaline phosphatase (APISO): 72 U/L (ref 37–153)
BUN/Creatinine Ratio: 11 (calc) (ref 6–22)
BUN: 15 mg/dL (ref 7–25)
CO2: 28 mmol/L (ref 20–32)
Calcium: 9.5 mg/dL (ref 8.6–10.4)
Chloride: 103 mmol/L (ref 98–110)
Creat: 1.39 mg/dL — ABNORMAL HIGH (ref 0.60–0.95)
Globulin: 3.5 g/dL (ref 1.9–3.7)
Glucose, Bld: 117 mg/dL — ABNORMAL HIGH (ref 65–99)
Potassium: 4.2 mmol/L (ref 3.5–5.3)
Sodium: 139 mmol/L (ref 135–146)
Total Bilirubin: 0.4 mg/dL (ref 0.2–1.2)
Total Protein: 7.4 g/dL (ref 6.1–8.1)

## 2023-10-19 LAB — TEST AUTHORIZATION

## 2023-10-19 LAB — LIPID PANEL
Cholesterol: 99 mg/dL (ref <200)
HDL: 32 mg/dL — ABNORMAL LOW (ref >=50)
LDL Cholesterol (Calc): 47 mg/dL
Non-HDL Cholesterol (Calc): 67 mg/dL (ref <130)
Total CHOL/HDL Ratio: 3.1 (calc) (ref <5.0)
Triglycerides: 110 mg/dL (ref <150)

## 2023-10-19 LAB — IRON,TIBC AND FERRITIN PANEL
%SAT: 36 % (ref 16–45)
Ferritin: 277 ng/mL (ref 16–288)
Iron: 70 ug/dL (ref 45–160)
TIBC: 194 ug/dL — ABNORMAL LOW (ref 250–450)

## 2023-10-19 LAB — HEMOGLOBIN A1C
Hgb A1c MFr Bld: 7.1 % — ABNORMAL HIGH (ref <5.7)
Mean Plasma Glucose: 157 mg/dL
eAG (mmol/L): 8.7 mmol/L

## 2023-10-22 ENCOUNTER — Encounter: Payer: Self-pay | Admitting: Internal Medicine

## 2023-11-29 ENCOUNTER — Other Ambulatory Visit: Payer: Self-pay | Admitting: Internal Medicine

## 2023-11-29 DIAGNOSIS — K219 Gastro-esophageal reflux disease without esophagitis: Secondary | ICD-10-CM

## 2023-11-30 NOTE — Telephone Encounter (Signed)
 Unable to refill per protocol, Rx expired. Discontinued 06/20/23.  Requested Prescriptions  Pending Prescriptions Disp Refills   pantoprazole  (PROTONIX ) 40 MG tablet [Pharmacy Med Name: PANTOPRAZOLE  SOD DR 40 MG TAB] 90 tablet 3    Sig: TAKE 1 TABLET BY MOUTH EVERY DAY     Gastroenterology: Proton Pump Inhibitors Passed - 11/30/2023  3:52 PM      Passed - Valid encounter within last 12 months    Recent Outpatient Visits           1 month ago Essential hypertension   [facility] [name], [place]

## 2023-12-04 ENCOUNTER — Telehealth: Payer: Self-pay | Admitting: Internal Medicine

## 2023-12-04 DIAGNOSIS — K219 Gastro-esophageal reflux disease without esophagitis: Secondary | ICD-10-CM

## 2023-12-04 DIAGNOSIS — E1165 Type 2 diabetes mellitus with hyperglycemia: Secondary | ICD-10-CM

## 2023-12-04 NOTE — Telephone Encounter (Signed)
 [facility], patient received 90 day supply of both pantoprazole  and metformin  on April 15th, too soon to refill at this time and patient has refills on file for both medications. Per pharmacy, patient should not be out of the medication and can contact the pharmacy regarding refills.

## 2023-12-04 NOTE — Telephone Encounter (Signed)
 Copied from CRM [id number]. Topic: Clinical - Medication Refill >> Dec 04, 2023  2:03 PM Ary Bitter R wrote: Medication:  pantoprazole  (PROTONIX ) 20 MG tablet metFORMIN  (GLUCOPHAGE -XR) 500 MG 24 hr tablet  Has the patient contacted their pharmacy? Yes, they requested the pantoprazole  and it was denied for refill not appropriate.   This is the patient's preferred pharmacy:  [id number] [place], [place] - [address] [place] [address] Phone: [phone] Fax: [phone]  Is this the correct pharmacy for this prescription? Yes  Has the prescription been filled recently? Yes  Is the patient out of the medication? No but has 1 or 2 pills left of each  Has the patient been seen for an appointment in the last year OR does the patient have an upcoming appointment? Yes  Can we respond through MyChart? No  Agent: Please be advised that Rx refills may take up to 3 business days. We ask that you follow-up with your pharmacy.

## 2023-12-09 ENCOUNTER — Other Ambulatory Visit: Payer: Self-pay | Admitting: Internal Medicine

## 2023-12-09 DIAGNOSIS — K219 Gastro-esophageal reflux disease without esophagitis: Secondary | ICD-10-CM

## 2023-12-10 ENCOUNTER — Ambulatory Visit: Payer: Self-pay

## 2023-12-10 NOTE — Telephone Encounter (Signed)
 FYI Only or Action Required?: FYI only for provider  Patient was last seen in primary care on 10/16/2023 by [name], DO. Called Nurse Triage reporting GI Problem. Symptoms began several days ago. Interventions attempted: Nothing. Symptoms are: stable.  Triage Disposition: No disposition on file. Pt to pick up refill accordingly. See notes   Patient/caregiver understands and will follow disposition?: Yes       Copied from CRM [id number]. Topic: Clinical - Red Word Triage >> Dec 10, 2023  3:34 PM [name] wrote: Red Word that prompted transfer to Nurse Triage: The patient was calling for a refill of her pantoprazole  (PROTONIX ). Per chart notes it is too soon for a refill but the patient states she is out of the medication and experiencing stomach pain. Answer Assessment - Initial Assessment Questions 1. REASON FOR CALL or QUESTION:   Patient calling b/c she is having stomach pain.  Reports her Protonix  usually helps with this abd pain, she is only experiencing this because she is out.   Patient sent a refill request for this medication, but was denied as it was too soon for a new prescription.   Last prescribed 10/16/23 Pantoprazole  20mg , 90 day dispense.   Patient reports the pharmacy only dispensed 40 tablets on 10/16/23 and not 90 as indicated in the order.   This nurse called the pharmacy on her behalf. Representative noted, per their review, the pt was dispensed a "stock" bottle, so "there is a possibility that she could have received 40 tablets. We are unable to verify, but we will refill the medication for her today."   Patient informed and verbalized understanding. Pt to pick up mediation soon  Protocols used: Information Only Call - No Triage-A-AH

## 2023-12-11 NOTE — Telephone Encounter (Signed)
 Requested Prescriptions  Refused Prescriptions Disp Refills   pantoprazole  (PROTONIX ) 40 MG tablet [Pharmacy Med Name: PANTOPRAZOLE  SOD DR 40 MG TAB] 90 tablet 3    Sig: TAKE 1 TABLET BY MOUTH EVERY DAY     Gastroenterology: Proton Pump Inhibitors Passed - 12/11/2023  9:00 AM      Passed - Valid encounter within last 12 months    Recent Outpatient Visits           1 month ago Essential hypertension   [facility] [name], [place]

## 2024-01-15 ENCOUNTER — Ambulatory Visit: Admitting: Internal Medicine

## 2024-01-15 ENCOUNTER — Other Ambulatory Visit: Payer: Self-pay | Admitting: Internal Medicine

## 2024-01-15 VITALS — BP 130/80 | HR 82 | Temp 98.3°F | Resp 14 | Ht 68.0 in | Wt 148.1 lb

## 2024-01-15 DIAGNOSIS — E1165 Type 2 diabetes mellitus with hyperglycemia: Secondary | ICD-10-CM

## 2024-01-15 DIAGNOSIS — K219 Gastro-esophageal reflux disease without esophagitis: Secondary | ICD-10-CM | POA: Diagnosis not present

## 2024-01-15 DIAGNOSIS — E782 Mixed hyperlipidemia: Secondary | ICD-10-CM

## 2024-01-15 LAB — POCT GLYCOSYLATED HEMOGLOBIN (HGB A1C): Hemoglobin A1C: 6.5 % — AB (ref 4.0–5.6)

## 2024-01-15 NOTE — Telephone Encounter (Unsigned)
 Copied from CRM [id number]. Topic: Clinical - Medication Refill >> Jan 15, 2024 11:13 AM Larissa S wrote: Medication: rosuvastatin  (CRESTOR ) 20 MG tablet  Has the patient contacted their pharmacy? Yes (Agent: If no, request that the patient contact the pharmacy for the refill. If patient does not wish to contact the pharmacy document the reason why and proceed with request.) (Agent: If yes, when and what did the pharmacy advise?)  This is the patient's preferred pharmacy:  [id number] [place], [place] - [address] [place] [address] Phone: [phone] Fax: [phone]    Is this the correct pharmacy for this prescription? Yes If no, delete pharmacy and type the correct one.   Has the prescription been filled recently? No  Is the patient out of the medication? Yes  Has the patient been seen for an appointment in the last year OR does the patient have an upcoming appointment? Yes  Can we respond through MyChart? No  Agent: Please be advised that Rx refills may take up to 3 business days. We ask that you follow-up with your pharmacy.

## 2024-01-15 NOTE — Progress Notes (Signed)
 Established Patient Office Visit  Subjective   Patient ID: Angela Horton, female    DOB: August 20, 1940  Age: 83 y.o. MRN: [id number]  Chief Complaint  Patient presents with   Medical Management of Chronic Issues    3 month recheck    HPI  Patient is here for follow up on chronic medical conditions. She is here with her husband today and states she is doing well.   Discussed the use of AI scribe software for clinical note transcription with the patient, who gave verbal consent to proceed.  History of Present Illness Angela Horton is an 83 year old female with type 2 diabetes who presents for a follow-up to recheck her A1c and diabetic foot exam.  Her A1c has improved to 6.5, down from 7.1 in April. She is currently taking metformin  once daily and has experienced recent weight loss.  She has a history of acid reflux, which is well-controlled with pantoprazole , with no recent symptoms. She is on a cholesterol medication. She has chronic anemia with recent labs showing low hemoglobin and normal iron levels.  Had lost some weight unintentionally in 2023, has since gained some back and doing well on supplemental protein drinks. Also on Protonix  20 mg which has helped as well.   Diabetes, Type 2: -Last A1c 7.1% 4/25 -Medications: Metformin  500 mg XR  -Patient is compliant with the above medications and reports no side effects.  -Eye exam: Due has an ophthalmologist she follows with -Foot exam: Due -Microalbumin: UTD -Statin: Yes -PNA vaccine: UTD  HLD: -Medications: Crestor  20 mg -Patient is compliant with above medications and reports no side effects.  -Last lipid panel: Lipid Panel     Component Value Date/Time   CHOL 99 10/16/2023 1058   CHOL 198 01/07/2015 1117   TRIG 110 10/16/2023 1058   HDL 32 (L) 10/16/2023 1058   HDL 39 (L) 01/07/2015 1117   CHOLHDL 3.1 10/16/2023 1058   VLDL 19 02/15/2017 0901   LDLCALC 47 10/16/2023 1058   LABVLDL 20 01/07/2015 1117    Health Maintenance: -Blood work UTD   Patient Active Problem List   Diagnosis Date Noted   Type 2 diabetes mellitus with hyperglycemia, without long-term current use of insulin (HCC) 06/20/2023   Gastroesophageal reflux disease 06/20/2023   Common bile duct stone    Dizziness 09/08/2021   Chronic kidney disease, stage 3a (HCC) 09/08/2021   Fall at home, initial encounter 09/08/2021   Hypokalemia 09/08/2021   Abnormal LFTs 09/08/2021   Elevated lipase 09/08/2021   Protein-calorie malnutrition, moderate (HCC) 09/08/2021   Weight loss, unintentional 09/08/2021   Malnutrition, unspecified type (HCC) 08/11/2021   Lightheadedness 07/13/2021   Mild cognitive impairment of uncertain or unknown etiology 07/13/2021   CKD (chronic kidney disease) stage 2, GFR 60-89 ml/min 07/26/2018   Premature atrial contractions 10/12/2015   Essential hypertension 01/07/2015   Hyperlipidemia 01/07/2015   Type 2 diabetes mellitus with other specified complication, without long-term current use of insulin (HCC)    Past Medical History:  Diagnosis Date   Allergy    Allergic to Amlodipine    Anemia    Depression 06/26/2021   Diabetes mellitus without complication (HCC)    GERD (gastroesophageal reflux disease)    Gout    Hyperlipidemia    Hypertension    Myocardial infarction (HCC)    Obesity    Stroke (HCC)    Ulcer    Past Surgical History:  Procedure Laterality Date   ABDOMINAL HYSTERECTOMY  COLONOSCOPY WITH PROPOFOL  N/A 09/11/2021   Procedure: COLONOSCOPY WITH PROPOFOL ;  Surgeon: Unk Corinn Skiff, MD;  Location: [facility] ENDOSCOPY;  Service: Gastroenterology;  Laterality: N/A;   ERCP N/A 10/27/2021   Procedure: ENDOSCOPIC RETROGRADE CHOLANGIOPANCREATOGRAPHY (ERCP);  Surgeon: Jinny Carmine, MD;  Location: [facility] ENDOSCOPY;  Service: Endoscopy;  Laterality: N/A;   ESOPHAGOGASTRODUODENOSCOPY (EGD) WITH PROPOFOL  N/A 09/11/2021   Procedure: ESOPHAGOGASTRODUODENOSCOPY (EGD) WITH PROPOFOL ;   Surgeon: Unk Corinn Skiff, MD;  Location: [facility] ENDOSCOPY;  Service: Gastroenterology;  Laterality: N/A;   EUS N/A 09/29/2021   Procedure: UPPER ENDOSCOPIC ULTRASOUND (EUS) LINEAR;  Surgeon: Queenie Asberry LABOR, MD;  Location: [facility] ENDOSCOPY;  Service: Gastroenterology;  Laterality: N/A;   TUBAL LIGATION     Social History   Tobacco Use   Smoking status: Never   Smokeless tobacco: Never   Tobacco comments:    Smoking cessation materials not required  Vaping Use   Vaping status: Never Used  Substance Use Topics   Alcohol use: No   Drug use: No   Social History   Socioeconomic History   Marital status: Married    Spouse name: Not on file   Number of children: 4   Years of education: Not on file   Highest education level: 12th grade  Occupational History   Not on file  Tobacco Use   Smoking status: Never   Smokeless tobacco: Never   Tobacco comments:    Smoking cessation materials not required  Vaping Use   Vaping status: Never Used  Substance and Sexual Activity   Alcohol use: No   Drug use: No   Sexual activity: Not Currently    Birth control/protection: None  Other Topics Concern   Not on file  Social History Narrative   Not on file   Social Drivers of Health   Financial Resource Strain: Low Risk  (01/15/2024)   Overall Financial Resource Strain (CARDIA)    Difficulty of Paying Living Expenses: Not hard at all  Food Insecurity: No Food Insecurity (01/15/2024)   Hunger Vital Sign    Worried About Running Out of Food in the Last Year: Never true    Ran Out of Food in the Last Year: Never true  Transportation Needs: No Transportation Needs (01/15/2024)   PRAPARE - Administrator, Civil Service (Medical): No    Lack of Transportation (Non-Medical): No  Physical Activity: Inactive (01/15/2024)   Exercise Vital Sign    Days of Exercise per Week: 0 days    Minutes of Exercise per Session: Not on file  Stress: No Stress Concern Present (01/15/2024)    [employer] of Occupational Health - Occupational Stress Questionnaire    Feeling of Stress: Not at all  Social Connections: Moderately Integrated (01/15/2024)   Social Connection and Isolation Panel    Frequency of Communication with Friends and Family: More than three times a week    Frequency of Social Gatherings with Friends and Family: Once a week    Attends Religious Services: More than 4 times per year    Active Member of [employer] or Organizations: No    Attends Banker Meetings: Not on file    Marital Status: Married  Intimate Partner Violence: Not At Risk (01/26/2023)   Humiliation, Afraid, Rape, and Kick questionnaire    Fear of Current or Ex-Partner: No    Emotionally Abused: No    Physically Abused: No    Sexually Abused: No   Family Status  Relation Name Status   Mother  Barbee Deceased       kidney disease   Father  Deceased       old age   Sister  Deceased       kidney diease   Brother  Deceased       stroke   Sister Elora Earnie Louder Deceased       heart disease/stroke   Daughter  Alive   Son  Alive   MGM  Deceased       unknown   MGF  Deceased       unknown   PGM  Deceased       old age   PGF  Deceased       unknown   Brother  Deceased       MVA   Daughter  Alive   Son  Alive   Sister Wanda Daring (Not Specified)  No partnership data on file   Family History  Problem Relation Age of Onset   Kidney disease Mother    Kidney disease Sister    Stroke Brother    Heart disease Sister    Stroke Sister    Stroke Sister    Allergies  Allergen Reactions   Metformin  And Related     Upset stomach headaches      Review of Systems  All other systems reviewed and are negative.     Objective:     BP 130/80 (Cuff Size: Normal)   Pulse 82   Temp 98.3 F (36.8 C) (Oral)   Resp 14   Ht 5' 8 (1.727 m)   Wt 148 lb 1.6 oz (67.2 kg)   SpO2 96%   BMI 22.52 kg/m  BP Readings from Last 3 Encounters:  01/15/24 130/80  10/16/23  132/78  06/20/23 134/78   Wt Readings from Last 3 Encounters:  01/15/24 148 lb 1.6 oz (67.2 kg)  10/16/23 151 lb 12.8 oz (68.9 kg)  06/20/23 166 lb 4.8 oz (75.4 kg)      Physical Exam Constitutional:      Appearance: Normal appearance.  HENT:     Head: Normocephalic and atraumatic.  Eyes:     Conjunctiva/sclera: Conjunctivae normal.  Cardiovascular:     Rate and Rhythm: Normal rate and regular rhythm.     Pulses:          Dorsalis pedis pulses are 2+ on the right side and 2+ on the left side.  Pulmonary:     Effort: Pulmonary effort is normal.     Breath sounds: Normal breath sounds.  Musculoskeletal:     Right foot: Normal range of motion. No deformity, bunion, Charcot foot, foot drop or prominent metatarsal heads.     Left foot: Normal range of motion. No deformity, bunion, Charcot foot, foot drop or prominent metatarsal heads.  Feet:     Right foot:     Protective Sensation: 6 sites tested.  6 sites sensed.     Skin integrity: Skin integrity normal.     Toenail Condition: Right toenails are normal.     Left foot:     Protective Sensation: 6 sites tested.  6 sites sensed.     Skin integrity: Skin integrity normal.     Toenail Condition: Left toenails are normal.  Skin:    General: Skin is warm and dry.  Neurological:     General: No focal deficit present.     Mental Status: She is alert. Mental status is at baseline.  Psychiatric:  Mood and Affect: Mood normal.        Behavior: Behavior normal.      No results found for any visits on 01/15/24.   Last CBC Lab Results  Component Value Date   WBC 4.3 10/16/2023   HGB 10.7 (L) 10/16/2023   HCT 33.5 (L) 10/16/2023   MCV 88.9 10/16/2023   MCH 28.4 10/16/2023   RDW 12.4 10/16/2023   PLT 246 10/16/2023   Last metabolic panel Lab Results  Component Value Date   GLUCOSE 117 (H) 10/16/2023   NA 139 10/16/2023   K 4.2 10/16/2023   CL 103 10/16/2023   CO2 28 10/16/2023   BUN 15 10/16/2023   CREATININE  1.39 (H) 10/16/2023   EGFR 49 (L) 12/18/2022   CALCIUM  9.5 10/16/2023   PROT 7.4 10/16/2023   ALBUMIN 3.1 (L) 09/08/2021   LABGLOB 3.6 01/07/2015   AGRATIO 1.2 01/07/2015   BILITOT 0.4 10/16/2023   ALKPHOS 31 (L) 09/08/2021   AST 85 (H) 10/16/2023   ALT 25 10/16/2023   ANIONGAP 11 10/27/2021   Last lipids Lab Results  Component Value Date   CHOL 99 10/16/2023   HDL 32 (L) 10/16/2023   LDLCALC 47 10/16/2023   TRIG 110 10/16/2023   CHOLHDL 3.1 10/16/2023   Last hemoglobin A1c Lab Results  Component Value Date   HGBA1C 7.1 (H) 10/16/2023   Last thyroid functions Lab Results  Component Value Date   TSH 2.900 09/09/2021   Last vitamin D No results found for: 25OHVITD2, 25OHVITD3, VD25OH Last vitamin B12 and Folate Lab Results  Component Value Date   VITAMINB12 1,135 (H) 12/18/2022   FOLATE >24.0 12/18/2022      The ASCVD Risk score (Arnett DK, et al., 2019) failed to calculate for the following reasons:   The 2019 ASCVD risk score is only valid for ages 45 to 20   Risk score cannot be calculated because patient has a medical history suggesting prior/existing ASCVD    Assessment & Plan:   Assessment & Plan Type 2 Diabetes Mellitus HbA1c improved to 6.5%, indicating good glycemic control due to adherence to metformin  and dietary modifications. - Continue metformin  once daily. - Continue dietary modifications. - Schedule follow-up in six months.  Chronic Kidney Disease Slight increase in kidney function markers. Advised to maintain adequate hydration. - Encourage adequate water intake. - Monitor kidney function.  Chronic Anemia Chronic anemia with slightly low hemoglobin level, normal iron levels. Requires monitoring. - Monitor hemoglobin levels regularly.  Hyperlipidemia Cholesterol controlled on last set of labs. Doing well on statin. - Continue statin.   Gastroesophageal Reflux Disease (GERD) No recent symptoms, indicating effective management  with pantoprazole . - Continue pantoprazole  as prescribed.   - POCT HgB A1C - HM Diabetes Foot Exam   Return in about 6 months (around 07/17/2024).    Sharyle Fischer, DO

## 2024-02-08 ENCOUNTER — Ambulatory Visit: Payer: Self-pay

## 2024-02-08 ENCOUNTER — Telehealth: Payer: Self-pay

## 2024-02-08 VITALS — BP 130/80 | Ht 68.0 in | Wt 156.0 lb

## 2024-02-08 DIAGNOSIS — Z0000 Encounter for general adult medical examination without abnormal findings: Secondary | ICD-10-CM | POA: Diagnosis not present

## 2024-02-08 NOTE — Patient Instructions (Signed)
 [name] , Thank you for taking time out of your busy schedule to complete your Annual Wellness Visit with me. I enjoyed our conversation and look forward to speaking with you again next year. I, as well as your care team,  appreciate your ongoing commitment to your health goals. Please review the following plan we discussed and let me know if I can assist you in the future. Your Game plan/ To Do List    Referrals: If you haven't heard from the office you've been referred to, please reach out to them at the phone provided.   Follow up Visits: We will see or speak with you next year for your Next Medicare AWV with our clinical staff Have you seen your provider in the last 6 months (3 months if uncontrolled diabetes)? No  Clinician Recommendations:  Aim for 30 minutes of exercise or brisk walking, 6-8 glasses of water, and 5 servings of fruits and vegetables each day.       This is a list of the screenings recommended for you:  Health Maintenance  Topic Date Due   Zoster (Shingles) Vaccine (1 of 2) Never done   Eye exam for diabetics  10/23/2018   COVID-19 Vaccine (4 - 2024-25 season) 03/04/2023   Flu Shot  02/01/2024   Yearly kidney health urinalysis for diabetes  06/19/2024   Hemoglobin A1C  07/17/2024   Yearly kidney function blood test for diabetes  10/15/2024   Complete foot exam   01/14/2025   Medicare Annual Wellness Visit  02/07/2025   Pneumococcal Vaccine for age over 32  Completed   DEXA scan (bone density measurement)  Completed   Hepatitis B Vaccine  Aged Out   HPV Vaccine  Aged Out   Meningitis B Vaccine  Aged Out   DTaP/Tdap/Td vaccine  Discontinued    Advanced directives: (In Chart) A copy of your advanced directives are scanned into your chart should your provider ever need it. Advance Care Planning is important because it:  [x]  Makes sure you receive the medical care that is consistent with your values, goals, and preferences  [x]  It provides guidance to your  family and loved ones and reduces their decisional burden about whether or not they are making the right decisions based on your wishes.  Follow the link provided in your after visit summary or read over the paperwork we have mailed to you to help you started getting your Advance Directives in place. If you need assistance in completing these, please reach out to us  so that we can help you!  See attachments for Preventive Care and Fall Prevention Tips.

## 2024-02-08 NOTE — Telephone Encounter (Signed)
 Copied from CRM [id number]. Topic: Appointments - Scheduling Inquiry for Clinic >> Feb 08, 2024 10:57 AM Charlet HERO wrote: Reason for CRM: Patient son is calling to ck on awv wants to be reached on [phone] (H) not the mobile number.

## 2024-02-08 NOTE — Progress Notes (Signed)
 Because this visit was a virtual/telehealth visit,  certain criteria was not obtained, such a blood pressure, CBG if applicable, and timed get up and go. Any medications not marked as taking were not mentioned during the medication reconciliation part of the visit. Any vitals not documented were not able to be obtained due to this being a telehealth visit or patient was unable to self-report a recent blood pressure reading due to a lack of equipment at home via telehealth. Vitals that have been documented are verbally provided by the patient.  This visit was performed by a medical professional under my direct supervision. I was immediately available for consultation/collaboration. I have reviewed and agree with the Annual Wellness Visit documentation.  Subjective:   [name] is a 83 y.o. who presents for a Medicare Wellness preventive visit.  As a reminder, Annual Wellness Visits don't include a physical exam, and some assessments may be limited, especially if this visit is performed virtually. We may recommend an in-person follow-up visit with your provider if needed.  Visit Complete: Virtual I connected with  Angela Horton on 02/08/24 by a audio enabled telemedicine application and verified that I am speaking with the correct person using two identifiers.  Patient Location: Home  Provider Location: Home Office  I discussed the limitations of evaluation and management by telemedicine. The patient expressed understanding and agreed to proceed.  Vital Signs: Because this visit was a virtual/telehealth visit, some criteria may be missing or patient reported. Any vitals not documented were not able to be obtained and vitals that have been documented are patient reported.  VideoDeclined- This patient declined Librarian, academic. Therefore the visit was completed with audio only.  Persons Participating in Visit: Patient.  AWV Questionnaire: Yes: Patient  Medicare AWV questionnaire was completed by the patient on 02/07/2024; I have confirmed that all information answered by patient is correct and no changes since this date.  Cardiac Risk Factors include: advanced age (>73men, >65 women);diabetes mellitus;hypertension;dyslipidemia     Objective:    Today's Vitals   02/08/24 1110  BP: 130/80  Weight: 156 lb (70.8 kg)  Height: 5' 8 (1.727 m)   Body mass index is 23.72 kg/m.     02/08/2024   11:09 AM 01/26/2023   10:58 AM 10/27/2021   10:38 AM 09/29/2021   12:02 PM 09/11/2021    9:56 AM 09/08/2021    6:24 PM 09/08/2021   10:42 AM  Advanced Directives  Does Patient Have a Medical Advance Directive? Yes Yes Yes Yes No  No  Type of Estate agent of [place];Living will Healthcare Power of [place];Living will  Healthcare Power of [place];Living will     Does patient want to make changes to medical advance directive? No - Patient declined        Copy of Healthcare Power of Attorney in Chart? Yes - validated most recent copy scanned in chart (See row information)   No - copy requested     Would patient like information on creating a medical advance directive?      No - Patient declined     Current Medications (verified) Outpatient Encounter Medications as of 02/08/2024  Medication Sig   brimonidine  (ALPHAGAN ) 0.2 % ophthalmic solution Place 1 drop into both eyes 2 (two) times daily.   feeding supplement (ENSURE ENLIVE / ENSURE PLUS) LIQD Take 237 mLs by mouth 2 (two) times daily between meals.   ferrous fumarate-b12-vitamic C-folic acid  (FEROCON) capsule TAKE 1 CAPSULE  BY MOUTH 2 (TWO) TIMES DAILY AFTER A MEAL.   metFORMIN  (GLUCOPHAGE -XR) 500 MG 24 hr tablet Take 1 tablet (500 mg total) by mouth daily with breakfast.   pantoprazole  (PROTONIX ) 20 MG tablet Take 1 tablet (20 mg total) by mouth daily.   rosuvastatin  (CRESTOR ) 20 MG tablet Take 1 tablet (20 mg total) by mouth daily.   No facility-administered encounter  medications on file as of 02/08/2024.    Allergies (verified) Metformin  and related   History: Past Medical History:  Diagnosis Date   Allergy    Allergic to Amlodipine    Anemia    Depression 06/26/2021   Diabetes mellitus without complication (HCC)    GERD (gastroesophageal reflux disease)    Gout    Hyperlipidemia    Hypertension    Myocardial infarction (HCC)    Obesity    Stroke [facility])    Ulcer    Past Surgical History:  Procedure Laterality Date   ABDOMINAL HYSTERECTOMY     COLONOSCOPY WITH PROPOFOL  N/A 09/11/2021   Procedure: COLONOSCOPY WITH PROPOFOL ;  Surgeon: Angela Corinn Skiff, MD;  Location: [facility] ENDOSCOPY;  Service: Gastroenterology;  Laterality: N/A;   ERCP N/A 10/27/2021   Procedure: ENDOSCOPIC RETROGRADE CHOLANGIOPANCREATOGRAPHY (ERCP);  Surgeon: Angela Carmine, MD;  [facility] ENDOSCOPY;  Service: Endoscopy;  Laterality: N/A;   ESOPHAGOGASTRODUODENOSCOPY (EGD) WITH PROPOFOL  N/A 09/11/2021   Procedure: ESOPHAGOGASTRODUODENOSCOPY (EGD) WITH PROPOFOL ;  Surgeon: Angela Corinn Skiff, MD;  Location: [facility] ENDOSCOPY;  Service: Gastroenterology;  Laterality: N/A;   EUS N/A 09/29/2021   Procedure: UPPER ENDOSCOPIC ULTRASOUND (EUS) LINEAR;  Surgeon: Angela Horton LABOR, MD;  Location[facility] ENDOSCOPY;  Service: Gastroenterology;  Laterality: N/A;   TUBAL LIGATION     Family History  Problem Relation Age of Onset   Kidney disease Mother    Kidney disease Sister    Stroke Brother    Heart disease Sister    Stroke Sister    Stroke Sister    Social History   Socioeconomic History   Marital status: Married    Spouse name: Not on file   Number of children: 4   Years of education: Not on file   Highest education level: 12th grade  Occupational History   Not on file  Tobacco Use   Smoking status: Never   Smokeless tobacco: Never   Tobacco comments:    Smoking cessation materials not required  Vaping Use   Vaping status: Never Used  Substance and Sexual  Activity   Alcohol use: No   Drug use: No   Sexual activity: Not Currently    Birth control/protection: None  Other Topics Concern   Not on file  Social History Narrative   Not on file   Social Drivers of Health   Financial Resource Strain: Low Risk  (02/08/2024)   Overall Financial Resource Strain (CARDIA)    Difficulty of Paying Living Expenses: Not hard at all  Food Insecurity: No Food Insecurity (02/08/2024)   Hunger Vital Sign    Worried About Running Out of Food in the Last Year: Never true    Ran Out of Food in the Last Year: Never true  Transportation Needs: No Transportation Needs (02/08/2024)   PRAPARE - Administrator, Civil Service (Medical): No    Lack of Transportation (Non-Medical): No  Physical Activity: Insufficiently Active (02/08/2024)   Exercise Vital Sign    Days of Exercise per Week: 2 days    Minutes of Exercise per Session: 20 min  Stress: No Stress  Concern Present (02/08/2024)   [employer] of Occupational Health - Occupational Stress Questionnaire    Feeling of Stress: Not at all  Social Connections: Moderately Integrated (02/08/2024)   Social Connection and Isolation Panel    Frequency of Communication with Friends and Family: More than three times a week    Frequency of Social Gatherings with Friends and Family: Once a week    Attends Religious Services: More than 4 times per year    Active Member of [employer] or Organizations: No    Attends Engineer, structural: Never    Marital Status: Married    Tobacco Counseling Counseling given: Not Answered Tobacco comments: Smoking cessation materials not required    Clinical Intake:  Pre-visit preparation completed: Yes  Pain : No/denies pain     BMI - recorded: 23.72 Nutritional Status: BMI of 19-24  Normal Nutritional Risks: None Diabetes: Yes CBG done?: No Did pt. bring in CBG monitor from home?: No  Lab Results  Component Value Date   HGBA1C 6.5 (A) 01/15/2024   HGBA1C  7.1 (H) 10/16/2023   HGBA1C 7.6 (A) 06/20/2023     How often do you need to have someone help you when you read instructions, pamphlets, or other written materials from your doctor or pharmacy?: 1 - Never  Interpreter Needed?: No  Information entered by :: Clearance Chenault,CMA   Activities of Daily Living     02/07/2024    5:07 PM  In your present state of health, do you have any difficulty performing the following activities:  Hearing? 0  Vision? 0  Difficulty concentrating or making decisions? 1  Walking or climbing stairs? 1  Dressing or bathing? 0  Doing errands, shopping? 1  Preparing Food and eating ? Y  Using the Toilet? N  In the past six months, have you accidently leaked urine? N  Do you have problems with loss of bowel control? N  Managing your Medications? N  Managing your Finances? Y  Housekeeping or managing your Housekeeping? Y    Patient Care Team: Bernardo Fend, DO as PCP - General (Internal Medicine)  I have updated your Care Teams any recent Medical Services you may have received from other providers in the past year.     Assessment:   This is a routine wellness examination for [facility]  Hearing/Vision screen Hearing Screening - Comments:: No difficulties Vision Screening - Comments:: Patient has glasses    Goals Addressed             This Visit's Progress    Increase physical activity   On track    Recommend increasing physical activity to at least 3 days per week       Depression Screen     02/08/2024   11:13 AM 06/20/2023   10:42 AM 01/26/2023   10:54 AM 12/18/2022   11:28 AM 09/19/2022    1:02 PM 06/06/2022   10:22 AM 01/16/2022    2:17 PM  PHQ 2/9 Scores  PHQ - 2 Score 0 0 0 0 0 0 0  PHQ- 9 Score 0   0 0 0 0    Fall Risk     02/07/2024    5:07 PM 06/20/2023   10:42 AM 01/26/2023   10:51 AM 12/18/2022   11:25 AM 09/19/2022    1:02 PM  Fall Risk   Falls in the past year? 1 0 0 0 0  Number falls in past yr: 1 0 0 0 0  Injury  with Fall? 0 0 0 0 0  Risk for fall due to : History of fall(s);Impaired balance/gait;Impaired mobility No Fall Risks No Fall Risks    Follow up Falls evaluation completed Falls evaluation completed Education provided;Falls prevention discussed      MEDICARE RISK AT HOME:  Medicare Risk at Home Any stairs in or around the home?: (Patient-Rptd) Yes If so, are there any without handrails?: (Patient-Rptd) No Home free of loose throw rugs in walkways, pet beds, electrical cords, etc?: (Patient-Rptd) Yes Adequate lighting in your home to reduce risk of falls?: (Patient-Rptd) Yes Life alert?: (Patient-Rptd) No Use of a cane, walker or w/c?: (Patient-Rptd) Yes Grab bars in the bathroom?: (Patient-Rptd) No Shower chair or bench in shower?: (Patient-Rptd) No Elevated toilet seat or a handicapped toilet?: (Patient-Rptd) No  TIMED UP AND GO:  Was the test performed?  No  Cognitive Function: 6CIT completed    07/13/2021   10:21 AM  MMSE - Mini Mental State Exam  Orientation to time 5  Orientation to Place 4  Registration 3  Attention/ Calculation 2  Recall 0  Language- name 2 objects 2  Language- repeat 1  Language- follow 3 step command 3  Language- read & follow direction 1  Write a sentence 1  Copy design 0  Total score 22        02/08/2024   11:13 AM 01/26/2023   11:01 AM  6CIT Screen  What Year? 4 points 4 points  What month? 0 points 0 points  What time? 0 points 0 points  Count back from 20 4 points 0 points  Months in reverse 0 points 2 points  Repeat phrase 10 points 10 points  Total Score 18 points 16 points    Immunizations Immunization History  Administered Date(s) Administered   Fluad Quad(high Dose 65+) 06/07/2021   PFIZER(Purple Top)SARS-COV-2 Vaccination 10/01/2019, 10/22/2019, 06/23/2020   PNEUMOCOCCAL CONJUGATE-20 06/06/2022   Pneumococcal Conjugate-13 04/29/2014   Pneumococcal Polysaccharide-23 06/13/2010   Td 06/13/2010    Screening Tests Health  Maintenance  Topic Date Due   Zoster Vaccines- Shingrix (1 of 2) Never done   OPHTHALMOLOGY EXAM  10/23/2018   COVID-19 Vaccine (4 - 2024-25 season) 03/04/2023   INFLUENZA VACCINE  02/01/2024   Diabetic kidney evaluation - Urine ACR  06/19/2024   HEMOGLOBIN A1C  07/17/2024   Diabetic kidney evaluation - eGFR measurement  10/15/2024   FOOT EXAM  01/14/2025   Medicare Annual Wellness (AWV)  02/07/2025   Pneumococcal Vaccine: 50+ Years  Completed   DEXA SCAN  Completed   Hepatitis B Vaccines  Aged Out   HPV VACCINES  Aged Out   Meningococcal B Vaccine  Aged Out   DTaP/Tdap/Td  Discontinued    Health Maintenance  Health Maintenance Due  Topic Date Due   Zoster Vaccines- Shingrix (1 of 2) Never done   OPHTHALMOLOGY EXAM  10/23/2018   COVID-19 Vaccine (4 - 2024-25 season) 03/04/2023   INFLUENZA VACCINE  02/01/2024   Health Maintenance Items Addressed:   Additional Screening:  Vision Screening: Recommended annual ophthalmology exams for early detection of glaucoma and other disorders of the eye. Would you like a referral to an eye doctor? No    Dental Screening: Recommended annual dental exams for proper oral hygiene  Community Resource Referral / Chronic Care Management: CRR required this visit?  No   CCM required this visit?  No   Plan:    I have personally reviewed and noted the following in the patient's  chart:   Medical and social history Use of alcohol, tobacco or illicit drugs  Current medications and supplements including opioid prescriptions. Patient is not currently taking opioid prescriptions. Functional ability and status Nutritional status Physical activity Advanced directives List of other physicians Hospitalizations, surgeries, and ER visits in previous 12 months Vitals Screenings to include cognitive, depression, and falls Referrals and appointments  In addition, I have reviewed and discussed with patient certain preventive protocols, quality  metrics, and best practice recommendations. A written personalized care plan for preventive services as well as general preventive health recommendations were provided to patient.   Angela Horton Right, [place]   02/08/2024   After Visit Summary: (MyChart) Due to this being a telephonic visit, the after visit summary with patients personalized plan was offered to patient via MyChart   Notes: Nothing significant to report at this time.

## 2024-02-20 DIAGNOSIS — Z79899 Other long term (current) drug therapy: Secondary | ICD-10-CM | POA: Diagnosis not present

## 2024-02-20 DIAGNOSIS — E119 Type 2 diabetes mellitus without complications: Secondary | ICD-10-CM | POA: Diagnosis not present

## 2024-02-22 NOTE — Telephone Encounter (Signed)
 Routing error

## 2024-02-26 ENCOUNTER — Other Ambulatory Visit: Payer: Self-pay | Admitting: Internal Medicine

## 2024-02-26 DIAGNOSIS — K219 Gastro-esophageal reflux disease without esophagitis: Secondary | ICD-10-CM

## 2024-02-26 NOTE — Telephone Encounter (Signed)
 Pt called requesting to speak to Sherrilyn, shared that she will be out of her current supply after today. Has one pill left.   Best contact: [phone]

## 2024-02-27 ENCOUNTER — Other Ambulatory Visit: Payer: Self-pay | Admitting: Emergency Medicine

## 2024-02-27 ENCOUNTER — Telehealth: Payer: Self-pay

## 2024-02-27 DIAGNOSIS — K219 Gastro-esophageal reflux disease without esophagitis: Secondary | ICD-10-CM

## 2024-02-27 MED ORDER — PANTOPRAZOLE SODIUM 20 MG PO TBEC: 20.0000 mg | DELAYED_RELEASE_TABLET | Freq: Every day | ORAL | 1 refills | Status: DC

## 2024-02-27 NOTE — Telephone Encounter (Signed)
 Copied from CRM #[id number]. Topic: Clinical - Prescription Issue >> Feb 27, 2024 10:58 AM Gustabo D wrote: pantoprazole  (PROTONIX ) 20 MG tablet - patient says she hasn't been able to get her medication the pharmacy says they don't have it

## 2024-02-27 NOTE — Telephone Encounter (Signed)
 Left message for patient that I sent in prescription, Per pharmacy she should still have about a week available

## 2024-02-27 NOTE — Telephone Encounter (Signed)
Pt is out of her meds.

## 2024-04-17 NOTE — Progress Notes (Signed)
 Angela Horton                                          MRN: [id number]   04/17/2024   The VBCI Quality Team Specialist reviewed this patient medical record for the purposes of chart review for care gap closure. The following were reviewed: chart review for care gap closure-kidney health evaluation for diabetes:eGFR  and uACR.    VBCI Quality Team

## 2024-05-13 ENCOUNTER — Telehealth: Payer: Self-pay

## 2024-05-13 DIAGNOSIS — E1165 Type 2 diabetes mellitus with hyperglycemia: Secondary | ICD-10-CM

## 2024-05-13 NOTE — Telephone Encounter (Signed)
 PT son notified, to stop by for urine microalbumin

## 2024-05-13 NOTE — Progress Notes (Signed)
 Pharmacy Quality Measure Review  This patient is appearing on a report for being at risk of failing the  Kidney Health Evaluation for Patients with Diabetes measure this calendar year.   Last documented A1c on 01/15/24  Last documented UACR on 06/20/23  Appointment with PCP scheduled on 07/17/24. Will order UACR and route to clinical team to contact patient to return to clinic for updated labs.   Bilaal Leib E. Marsh, PharmD, CPP Clinical Pharmacist [facility] [phone]

## 2024-05-16 DIAGNOSIS — E1165 Type 2 diabetes mellitus with hyperglycemia: Secondary | ICD-10-CM | POA: Diagnosis not present

## 2024-05-17 LAB — MICROALBUMIN / CREATININE URINE RATIO
Creatinine, Urine: 167 mg/dL (ref 20–275)
Microalb Creat Ratio: 40 mg/g{creat} — ABNORMAL HIGH (ref <30)
Microalb, Ur: 6.7 mg/dL

## 2024-05-19 ENCOUNTER — Ambulatory Visit: Payer: Self-pay | Admitting: Internal Medicine

## 2024-05-23 ENCOUNTER — Other Ambulatory Visit: Payer: Self-pay | Admitting: Internal Medicine

## 2024-05-23 DIAGNOSIS — E1165 Type 2 diabetes mellitus with hyperglycemia: Secondary | ICD-10-CM

## 2024-05-24 NOTE — Telephone Encounter (Signed)
 Requested medication (s) are due for refill today: yes  Requested medication (s) are on the active medication list: yes  Last refill:  10/16/23  Future visit scheduled: yes   Notes to clinic:  Please review for refill. Unable to refill per protocol due to no recent eGFR or B12.     Requested Prescriptions  Pending Prescriptions Disp Refills   metFORMIN  (GLUCOPHAGE -XR) 500 MG 24 hr tablet [Pharmacy Med Name: METFORMIN  HCL ER 500 MG TABLET] 90 tablet 1    Sig: TAKE 1 TABLET BY MOUTH EVERY DAY WITH BREAKFAST     Endocrinology:  Diabetes - Biguanides Failed - 05/24/2024  1:27 PM      Failed - Cr in normal range and within 360 days    Creat  Date Value Ref Range Status  10/16/2023 1.39 (H) 0.60 - 0.95 mg/dL Final   Creatinine, Urine  Date Value Ref Range Status  05/16/2024 167 20 - 275 mg/dL Final         Failed - eGFR in normal range and within 360 days    GFR, Est African American  Date Value Ref Range Status  07/27/2020 64 > OR = 60 mL/min/1.58m2 Final   GFR, Est Non African American  Date Value Ref Range Status  07/27/2020 55 (L) > OR = 60 mL/min/1.25m2 Final   GFR, Estimated  Date Value Ref Range Status  10/27/2021 56 (L) >60 mL/min Final    Comment:    (NOTE) Calculated using the CKD-EPI Creatinine Equation (2021)    eGFR  Date Value Ref Range Status  12/18/2022 49 (L) > OR = 60 mL/min/1.23m2 Final         Failed - B12 Level in normal range and within 720 days    Vitamin B-12  Date Value Ref Range Status  12/18/2022 1,135 (H) 200 - 1,100 pg/mL Final         Passed - HBA1C is between 0 and 7.9 and within 180 days    Hemoglobin A1C  Date Value Ref Range Status  01/15/2024 6.5 (A) 4.0 - 5.6 % Final   Hgb A1c MFr Bld  Date Value Ref Range Status  10/16/2023 7.1 (H) <5.7 % Final    Comment:    For someone without known diabetes, a hemoglobin A1c value of 6.5% or greater indicates that they may have  diabetes and this should be confirmed with a follow-up   test. . For someone with known diabetes, a value <7% indicates  that their diabetes is well controlled and a value  greater than or equal to 7% indicates suboptimal  control. A1c targets should be individualized based on  duration of diabetes, age, comorbid conditions, and  other considerations. . Currently, no consensus exists regarding use of hemoglobin A1c for diagnosis of diabetes for children. SABRA Amy - Valid encounter within last 6 months    Recent Outpatient Visits           4 months ago Type 2 diabetes mellitus with hyperglycemia, without long-term current use of insulin [facility])   [facility] Bernardo Fend, DO   7 months ago Essential hypertension   [facility] Bernardo Fend, DO       Future Appointments             In 1 month Bernardo Fend, DO [facility] [place]  Passed - CBC within normal limits and completed in the last 12 months    WBC  Date Value Ref Range Status  10/16/2023 4.3 3.8 - 10.8 Thousand/uL Final   RBC  Date Value Ref Range Status  10/16/2023 3.77 (L) 3.80 - 5.10 Million/uL Final   Hemoglobin  Date Value Ref Range Status  10/16/2023 10.7 (L) 11.7 - 15.5 g/dL Final   HGB  Date Value Ref Range Status  06/15/2014 10.3 (L) 12.0 - 16.0 g/dL Final   HCT  Date Value Ref Range Status  10/16/2023 33.5 (L) 35.0 - 45.0 % Final  06/15/2014 32.0 (L) 35.0 - 47.0 % Final   MCHC  Date Value Ref Range Status  10/16/2023 31.9 (L) 32.0 - 36.0 g/dL Final    Comment:    For adults, a slight decrease in the calculated MCHC value (in the range of 30 to 32 g/dL) is most likely not clinically significant; however, it should be interpreted with caution in correlation with other red cell parameters and the patient's clinical condition.    [facility]  Date Value Ref Range Status  10/16/2023 28.4 27.0 - 33.0 pg Final   MCV  Date Value  Ref Range Status  10/16/2023 88.9 80.0 - 100.0 fL Final  06/15/2014 88 80 - 100 fL Final   No results found for: PLTCOUNTKUC, LABPLAT, POCPLA RDW  Date Value Ref Range Status  10/16/2023 12.4 11.0 - 15.0 % Final  06/15/2014 14.7 (H) 11.5 - 14.5 % Final

## 2024-06-08 ENCOUNTER — Encounter: Payer: Self-pay | Admitting: Internal Medicine

## 2024-06-13 NOTE — Progress Notes (Signed)
 Angela Horton                                          MRN: [id number]   06/13/2024   The VBCI Quality Team Specialist reviewed this patient medical record for the purposes of chart review for care gap closure. The following were reviewed: chart review for care gap closure-kidney health evaluation for diabetes:eGFR  and uACR.  No eGFR    VBCI Quality Team

## 2024-07-17 ENCOUNTER — Other Ambulatory Visit: Payer: Self-pay

## 2024-07-17 ENCOUNTER — Ambulatory Visit: Admitting: Internal Medicine

## 2024-07-17 ENCOUNTER — Encounter: Payer: Self-pay | Admitting: Internal Medicine

## 2024-07-17 VITALS — BP 122/74 | HR 77 | Temp 98.4°F | Resp 16 | Ht 68.0 in | Wt 152.8 lb

## 2024-07-17 DIAGNOSIS — E1165 Type 2 diabetes mellitus with hyperglycemia: Secondary | ICD-10-CM

## 2024-07-17 DIAGNOSIS — E782 Mixed hyperlipidemia: Secondary | ICD-10-CM

## 2024-07-17 DIAGNOSIS — Z7984 Long term (current) use of oral hypoglycemic drugs: Secondary | ICD-10-CM

## 2024-07-17 DIAGNOSIS — K219 Gastro-esophageal reflux disease without esophagitis: Secondary | ICD-10-CM

## 2024-07-17 LAB — POCT GLYCOSYLATED HEMOGLOBIN (HGB A1C): Hemoglobin A1C: 7 % — AB (ref 4.0–5.6)

## 2024-07-17 MED ORDER — PANTOPRAZOLE SODIUM 20 MG PO TBEC: 20.0000 mg | DELAYED_RELEASE_TABLET | Freq: Every day | ORAL | 1 refills | Status: AC | PRN

## 2024-07-17 MED ORDER — METFORMIN HCL ER 500 MG PO TB24: 500.0000 mg | ORAL_TABLET | Freq: Every day | ORAL | 1 refills | Status: AC

## 2024-07-17 NOTE — Patient Instructions (Signed)
 Respiratory Syncytial Virus (RSV) Vaccine Injection What is this medication? RESPIRATORY SYNCYTIAL VIRUS VACCINE (reh SPIR uh tor ee sin SISH uhl VY rus vak SEEN) reduces the risk of respiratory syncytial virus (RSV). It does not treat RSV. It is still possible to get RSV after receiving this vaccine, but the symptoms may be less severe or not last as long. It works by helping your immune system learn how to fight off a future infection. This medicine may be used for other purposes; ask your health care provider or pharmacist if you have questions. COMMON BRAND NAME(S): ABRYSVO, AREXVY, mRESVIA What should I tell my care team before I take this medication? They need to know if you have any of these conditions: Immune system problems An unusual or allergic reaction to respiratory syncytial virus vaccine, other medications, foods, dyes, or preservatives Pregnant or trying to get pregnant Breastfeeding How should I use this medication? This vaccine is injected into a muscle. It is given by your care team. A copy of Vaccine Information Statements will be given before each vaccination. Be sure to read this information carefully each time. This sheet may change often. A copy of Vaccine Information Statements will be given before each vaccination. Be sure to read this information carefully each time. This sheet may change often. Talk to your care team about the use of this vaccine in children. It is not approved for use in children. Overdosage: If you think you have taken too much of this medicine contact a poison control center or emergency room at once. NOTE: This medicine is only for you. Do not share this medicine with others. What if I miss a dose? This does not apply. What may interact with this medication? Medications that lower your chance of fighting infection This list may not describe all possible interactions. Give your health care provider a list of all the medicines, herbs,  non-prescription drugs, or dietary supplements you use. Also tell them if you smoke, drink alcohol, or use illegal drugs. Some items may interact with your medicine. What should I watch for while using this medication? Visit your care team for regular health checks. Before you receive this vaccine, talk to your care team if you have an acute illness. Vaccines can be given to people with mild acute illness, such as the common cold or diarrhea. Discuss with your care team the risks and benefits of receiving this vaccine during a moderate to severe illness. Your care team may choose to wait to give you the vaccine when you feel better. Report any side effects to your care team or to the Vaccine Adverse Event Reporting System (VAERS) website at https://vaers.LAgents.no. This is only for reporting side effects; VAERs staff do not give medical advice. What side effects may I notice from receiving this medication? Side effects that you should report to your care team as soon as possible: Allergic reactions--skin rash, itching, hives, swelling of the face, lips, tongue, or throat Feeling faint or lightheaded Weakness and tingling in the feet and legs that spreads to upper body Side effects that usually do not require medical attention (report these to your care team if they continue or are bothersome): General discomfort and fatigue Headache Muscle pain Pain, redness, or irritation at injection site This list may not describe all possible side effects. Call your doctor for medical advice about side effects. You may report side effects to FDA at 1-800-FDA-1088. Where should I keep my medication? This vaccine is only given by your care  team. It will not be stored at home. NOTE: This sheet is a summary. It may not cover all possible information. If you have questions about this medicine, talk to your doctor, pharmacist, or health care provider.  2025 Elsevier/Gold Standard (2023-07-11 00:00:00)

## 2024-07-17 NOTE — Progress Notes (Signed)
 "  Established Patient Office Visit  Subjective   Patient ID: Angela Horton, female    DOB: 1940/12/24  Age: 84 y.o. MRN: [id number]  Chief Complaint  Patient presents with   Medical Management of Chronic Issues    6 monht recheck    HPI  Patient is here for follow up on chronic medical conditions. She is here with her daughter today and states she is doing well.   Discussed the use of AI scribe software for clinical note transcription with the patient, who gave verbal consent to proceed.  History of Present Illness  Angela Horton is an 84 year old female with diabetes and hyperlipidemia who presents for a routine follow-up visit.  Her diabetes is managed with metformin  and reduced candy intake. She checks blood sugars in the evening, which average about 100, and her A1c is 7.0, improved from last year.  She takes medication for hyperlipidemia, with prior LDL of 47.  She takes pantoprazole  for stomach acid and uses oatmeal and Ensure as part of her diet when she skips meals.  Her weight is stable at 152 pounds after significant loss two years ago, and she has maintained this weight for the past few months.  Her caregiver and family assist with daily needs, and there are ongoing concerns about her short-term memory and safety, including vulnerability to phone scams.  Diabetes, Type 2: -Last A1c 6.5% 7/25 -Medications: Metformin  500 mg XR  -Patient is compliant with the above medications and reports no side effects. -Blood sugars: 100 average   -Eye exam: Due has an ophthalmologist she follows with -Foot exam: UTD -Microalbumin: UTD -Statin: Yes -PNA vaccine: UTD  HLD: -Medications: Crestor  20 mg -Patient is compliant with above medications and reports no side effects.  -Last lipid panel: Lipid Panel     Component Value Date/Time   CHOL 99 10/16/2023 1058   CHOL 198 01/07/2015 1117   TRIG 110 10/16/2023 1058   HDL 32 (L) 10/16/2023 1058   HDL 39 (L) 01/07/2015  1117   CHOLHDL 3.1 10/16/2023 1058   VLDL 19 02/15/2017 0901   LDLCALC 47 10/16/2023 1058   LABVLDL 20 01/07/2015 1117   Health Maintenance: -Blood work UTD -Discussed RSV vaccine at the pharmacy, info printed   Patient Active Problem List   Diagnosis Date Noted   Type 2 diabetes mellitus with hyperglycemia, without long-term current use of insulin (HCC) 06/20/2023   Gastroesophageal reflux disease 06/20/2023   Common bile duct stone    Dizziness 09/08/2021   Chronic kidney disease, stage 3a (HCC) 09/08/2021   Fall at home, initial encounter 09/08/2021   Hypokalemia 09/08/2021   Abnormal LFTs 09/08/2021   Elevated lipase 09/08/2021   Protein-calorie malnutrition, moderate 09/08/2021   Weight loss, unintentional 09/08/2021   Malnutrition, unspecified type 08/11/2021   Lightheadedness 07/13/2021   Mild cognitive impairment of uncertain or unknown etiology 07/13/2021   CKD (chronic kidney disease) stage 2, GFR 60-89 ml/min 07/26/2018   Premature atrial contractions 10/12/2015   Essential hypertension 01/07/2015   Hyperlipidemia 01/07/2015   Type 2 diabetes mellitus with other specified complication, without long-term current use of insulin (HCC)    Past Medical History:  Diagnosis Date   Allergy    Allergic to Amlodipine    Anemia    Depression 06/26/2021   Diabetes mellitus without complication (HCC)    GERD (gastroesophageal reflux disease)    Gout    Hyperlipidemia    Hypertension    Myocardial infarction (HCC)  Obesity    Stroke [facility])    Ulcer    Past Surgical History:  Procedure Laterality Date   ABDOMINAL HYSTERECTOMY     COLONOSCOPY WITH PROPOFOL  N/A 09/11/2021   Procedure: COLONOSCOPY WITH PROPOFOL ;  Surgeon: Unk Corinn Skiff, MD;  Location: [facility] ENDOSCOPY;  Service: Gastroenterology;  Laterality: N/A;   ERCP N/A 10/27/2021   Procedure: ENDOSCOPIC RETROGRADE CHOLANGIOPANCREATOGRAPHY (ERCP);  Surgeon: Jinny Carmine, MD;  Location: [facility] ENDOSCOPY;   Service: Endoscopy;  Laterality: N/A;   ESOPHAGOGASTRODUODENOSCOPY (EGD) WITH PROPOFOL  N/A 09/11/2021   Procedure: ESOPHAGOGASTRODUODENOSCOPY (EGD) WITH PROPOFOL ;  Surgeon: Unk Corinn Skiff, MD;  Location: [facility] ENDOSCOPY;  Service: Gastroenterology;  Laterality: N/A;   EUS N/A 09/29/2021   Procedure: UPPER ENDOSCOPIC ULTRASOUND (EUS) LINEAR;  Surgeon: Queenie Asberry LABOR, MD;  Location: [facility] ENDOSCOPY;  Service: Gastroenterology;  Laterality: N/A;   TUBAL LIGATION     Social History   Tobacco Use   Smoking status: Never   Smokeless tobacco: Never   Tobacco comments:    Smoking cessation materials not required  Vaping Use   Vaping status: Never Used  Substance Use Topics   Alcohol use: No   Drug use: No   Social History   Socioeconomic History   Marital status: Married    Spouse name: Not on file   Number of children: 4   Years of education: Not on file   Highest education level: 12th grade  Occupational History   Not on file  Tobacco Use   Smoking status: Never   Smokeless tobacco: Never   Tobacco comments:    Smoking cessation materials not required  Vaping Use   Vaping status: Never Used  Substance and Sexual Activity   Alcohol use: No   Drug use: No   Sexual activity: Not Currently    Birth control/protection: None  Other Topics Concern   Not on file  Social History Narrative   Not on file   Social Drivers of Health   Tobacco Use: Low Risk (07/17/2024)   Patient History    Smoking Tobacco Use: Never    Smokeless Tobacco Use: Never    Passive Exposure: Not on file  Financial Resource Strain: Low Risk (07/16/2024)   Overall Financial Resource Strain (CARDIA)    Difficulty of Paying Living Expenses: Not hard at all  Food Insecurity: No Food Insecurity (07/16/2024)   Epic    Worried About Programme Researcher, Broadcasting/film/video in the Last Year: Never true    Ran Out of Food in the Last Year: Never true  Transportation Needs: No Transportation Needs (07/16/2024)   Epic     Lack of Transportation (Medical): No    Lack of Transportation (Non-Medical): No  Physical Activity: Inactive (07/16/2024)   Exercise Vital Sign    Days of Exercise per Week: 0 days    Minutes of Exercise per Session: Not on file  Stress: Stress Concern Present (07/16/2024)   [employer] of Occupational Health - Occupational Stress Questionnaire    Feeling of Stress: To some extent  Social Connections: Moderately Isolated (07/16/2024)   Social Connection and Isolation Panel    Frequency of Communication with Friends and Family: More than three times a week    Frequency of Social Gatherings with Friends and Family: More than three times a week    Attends Religious Services: 1 to 4 times per year    Active Member of [employer] or Organizations: No    Attends Banker Meetings: Not on file  Marital Status: Widowed  Intimate Partner Violence: Not At Risk (02/08/2024)   Epic    Fear of Current or Ex-Partner: No    Emotionally Abused: No    Physically Abused: No    Sexually Abused: No  Depression (PHQ2-9): Low Risk (02/08/2024)   Depression (PHQ2-9)    PHQ-2 Score: 0  Alcohol Screen: Low Risk (02/08/2024)   Alcohol Screen    Last Alcohol Screening Score (AUDIT): 0  Housing: Unknown (07/16/2024)   Epic    Unable to Pay for Housing in the Last Year: Patient declined    Number of Times Moved in the Last Year: 0    Homeless in the Last Year: No  Utilities: Not At Risk (02/08/2024)   Epic    Threatened with loss of utilities: No  Health Literacy: Adequate Health Literacy (02/08/2024)   B1300 Health Literacy    Frequency of need for help with medical instructions: Never   Family Status  Relation Name Status   Mother [place] Deceased       kidney disease   Father  Deceased       old age   Sister  Deceased       kidney diease   Brother  Deceased       stroke   Sister Elora Earnie Louder Deceased       heart disease/stroke   Daughter  Alive   Son  Alive   MGM  Deceased        unknown   MGF  Deceased       unknown   PGM  Deceased       old age   PGF  Deceased       unknown   Brother  Deceased       MVA   Daughter  Alive   Son  Alive   Sister Wanda Daring (Not Specified)  No partnership data on file   Family History  Problem Relation Age of Onset   Kidney disease Mother    Kidney disease Sister    Stroke Brother    Heart disease Sister    Stroke Sister    Stroke Sister    Allergies  Allergen Reactions   Metformin  And Related     Upset stomach headaches      Review of Systems  All other systems reviewed and are negative.     Objective:     BP 122/74 (Cuff Size: Normal)   Pulse 77   Temp 98.4 F (36.9 C) (Oral)   Resp 16   Ht 5' 8 (1.727 m)   Wt 152 lb 12.8 oz (69.3 kg)   SpO2 94%   BMI 23.23 kg/m  BP Readings from Last 3 Encounters:  07/17/24 122/74  02/08/24 130/80  01/15/24 130/80   Wt Readings from Last 3 Encounters:  07/17/24 152 lb 12.8 oz (69.3 kg)  02/08/24 156 lb (70.8 kg)  01/15/24 148 lb 1.6 oz (67.2 kg)      Physical Exam Constitutional:      Appearance: Normal appearance.  HENT:     Head: Normocephalic and atraumatic.     Mouth/Throat:     Mouth: Mucous membranes are moist.     Pharynx: Oropharynx is clear.  Eyes:     Extraocular Movements: Extraocular movements intact.     Conjunctiva/sclera: Conjunctivae normal.     Pupils: Pupils are equal, round, and reactive to light.  Cardiovascular:     Rate and Rhythm: Normal rate and regular rhythm.  Pulmonary:     Effort: Pulmonary effort is normal.     Breath sounds: Normal breath sounds.  Musculoskeletal:     Right lower leg: No edema.     Left lower leg: No edema.  Skin:    General: Skin is warm and dry.  Neurological:     General: No focal deficit present.     Mental Status: She is alert. Mental status is at baseline.  Psychiatric:        Mood and Affect: Mood normal.        Behavior: Behavior normal.      No results found for any visits  on 07/17/24.   Last CBC Lab Results  Component Value Date   WBC 4.3 10/16/2023   HGB 10.7 (L) 10/16/2023   HCT 33.5 (L) 10/16/2023   MCV 88.9 10/16/2023   MCH 28.4 10/16/2023   RDW 12.4 10/16/2023   PLT 246 10/16/2023   Last metabolic panel Lab Results  Component Value Date   GLUCOSE 117 (H) 10/16/2023   NA 139 10/16/2023   K 4.2 10/16/2023   CL 103 10/16/2023   CO2 28 10/16/2023   BUN 15 10/16/2023   CREATININE 1.39 (H) 10/16/2023   EGFR 49 (L) 12/18/2022   CALCIUM  9.5 10/16/2023   PROT 7.4 10/16/2023   ALBUMIN 3.1 (L) 09/08/2021   LABGLOB 3.6 01/07/2015   AGRATIO 1.2 01/07/2015   BILITOT 0.4 10/16/2023   ALKPHOS 31 (L) 09/08/2021   AST 85 (H) 10/16/2023   ALT 25 10/16/2023   ANIONGAP 11 10/27/2021   Last lipids Lab Results  Component Value Date   CHOL 99 10/16/2023   HDL 32 (L) 10/16/2023   LDLCALC 47 10/16/2023   TRIG 110 10/16/2023   CHOLHDL 3.1 10/16/2023   Last hemoglobin A1c Lab Results  Component Value Date   HGBA1C 6.5 (A) 01/15/2024   Last thyroid functions Lab Results  Component Value Date   TSH 2.900 09/09/2021   Last vitamin D No results found for: 25OHVITD2, 25OHVITD3, VD25OH Last vitamin B12 and Folate Lab Results  Component Value Date   VITAMINB12 1,135 (H) 12/18/2022   FOLATE >24.0 12/18/2022      The ASCVD Risk score (Arnett DK, et al., 2019) failed to calculate for the following reasons:   The 2019 ASCVD risk score is only valid for ages 11 to 48   Risk score cannot be calculated because patient has a medical history suggesting prior/existing ASCVD   * - Cholesterol units were assumed    Assessment & Plan:   Assessment & Plan  Type 2 diabetes mellitus w/Hyperglycemia  Well-controlled with A1c of 7.0% and average postprandial glucose of 100 mg/dL. Current metformin  regimen effective. - Continue metformin  at current dose. - Refilled metformin  prescription.  Hyperlipidemia - Stable, continue statin. Plan to recheck  lipid panel at follow up.   Gastroesophageal reflux disease Symptoms well-controlled with pantoprazole . Potential to reduce medication frequency. - Changed pantoprazole  to as-needed basis. - Refilled pantoprazole  prescription.  General Health Maintenance Discussed RSV vaccine to prevent severe illness, especially in older adults. Emphasized vaccination importance. - Provided information on RSV vaccine. - Encouraged consideration of RSV vaccine at pharmacy.  - POCT HgB A1C - metFORMIN  (GLUCOPHAGE -XR) 500 MG 24 hr tablet; Take 1 tablet (500 mg total) by mouth daily with breakfast.  Dispense: 90 tablet; Refill: 1 - pantoprazole  (PROTONIX ) 20 MG tablet; Take 1 tablet (20 mg total) by mouth daily as needed for heartburn.  Dispense: 90 tablet; Refill: 1  Return in about 6 months (around 01/14/2025).    Sharyle Fischer, DO "

## 2025-01-14 ENCOUNTER — Ambulatory Visit: Admitting: Internal Medicine

## 2025-02-13 ENCOUNTER — Ambulatory Visit
